# Patient Record
Sex: Male | Born: 1985 | Hispanic: No | Marital: Single | Smoking: Current every day smoker
Health system: Southern US, Community
[De-identification: ages and names within clinical notes are randomized; demographics above are authoritative.]

## PROBLEM LIST (undated history)

## (undated) DIAGNOSIS — K219 Gastro-esophageal reflux disease without esophagitis: Secondary | ICD-10-CM

## (undated) DIAGNOSIS — Z800 Family history of malignant neoplasm of digestive organs: Secondary | ICD-10-CM

## (undated) DIAGNOSIS — E785 Hyperlipidemia, unspecified: Secondary | ICD-10-CM

## (undated) DIAGNOSIS — F259 Schizoaffective disorder, unspecified: Secondary | ICD-10-CM

## (undated) DIAGNOSIS — M1990 Unspecified osteoarthritis, unspecified site: Secondary | ICD-10-CM

## (undated) DIAGNOSIS — Z809 Family history of malignant neoplasm, unspecified: Secondary | ICD-10-CM

## (undated) DIAGNOSIS — J449 Chronic obstructive pulmonary disease, unspecified: Secondary | ICD-10-CM

## (undated) DIAGNOSIS — F419 Anxiety disorder, unspecified: Secondary | ICD-10-CM

## (undated) DIAGNOSIS — I10 Essential (primary) hypertension: Secondary | ICD-10-CM

## (undated) HISTORY — DX: Essential (primary) hypertension: I10

## (undated) HISTORY — DX: Family history of malignant neoplasm, unspecified: Z80.9

## (undated) HISTORY — DX: Chronic obstructive pulmonary disease, unspecified: J44.9

## (undated) HISTORY — DX: Family history of malignant neoplasm of digestive organs: Z80.0

## (undated) HISTORY — DX: Gastro-esophageal reflux disease without esophagitis: K21.9

## (undated) HISTORY — DX: Unspecified osteoarthritis, unspecified site: M19.90

## (undated) HISTORY — DX: Anxiety disorder, unspecified: F41.9

## (undated) HISTORY — DX: Hyperlipidemia, unspecified: E78.5

---
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
## 2005-02-12 ENCOUNTER — Ambulatory Visit: Payer: Self-pay | Admitting: Internal Medicine

## 2010-06-14 ENCOUNTER — Emergency Department (HOSPITAL_COMMUNITY)
Admission: EM | Admit: 2010-06-14 | Discharge: 2010-06-15 | Payer: Self-pay | Source: Home / Self Care | Admitting: Emergency Medicine

## 2010-08-30 LAB — POCT I-STAT, CHEM 8
BUN: 10 mg/dL (ref 6–23)
Calcium, Ion: 1.06 mmol/L — ABNORMAL LOW (ref 1.12–1.32)
Chloride: 103 mEq/L (ref 96–112)
Creatinine, Ser: 1 mg/dL (ref 0.4–1.5)
Glucose, Bld: 112 mg/dL — ABNORMAL HIGH (ref 70–99)
HCT: 48 % (ref 39.0–52.0)
Hemoglobin: 16.3 g/dL (ref 13.0–17.0)
Potassium: 3.3 mEq/L — ABNORMAL LOW (ref 3.5–5.1)
Sodium: 140 mEq/L (ref 135–145)
TCO2: 25 mmol/L (ref 0–100)
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
## 2011-08-27 ENCOUNTER — Emergency Department (HOSPITAL_COMMUNITY)
Admission: EM | Admit: 2011-08-27 | Discharge: 2011-08-28 | Disposition: A | Discharge status: Home / Self Care | Payer: Self-pay | Attending: Emergency Medicine | Admitting: Emergency Medicine

## 2011-08-27 ENCOUNTER — Encounter (HOSPITAL_COMMUNITY): Payer: Self-pay | Admitting: Emergency Medicine

## 2011-08-27 DIAGNOSIS — K2970 Gastritis, unspecified, without bleeding: Secondary | ICD-10-CM | POA: Insufficient documentation

## 2011-08-27 DIAGNOSIS — Z79899 Other long term (current) drug therapy: Secondary | ICD-10-CM | POA: Insufficient documentation

## 2011-08-27 DIAGNOSIS — R112 Nausea with vomiting, unspecified: Secondary | ICD-10-CM | POA: Insufficient documentation

## 2011-08-27 DIAGNOSIS — R1013 Epigastric pain: Secondary | ICD-10-CM | POA: Insufficient documentation

## 2011-08-27 NOTE — ED Notes (Signed)
PT. REPORTS BLOODY EMESIS WITH UPPER ABDOMINALPAIN ONSET THIS EVENING , STATES HISTORY OF GASTRIC ULCER.

## 2011-08-28 ENCOUNTER — Encounter (HOSPITAL_COMMUNITY): Payer: Self-pay | Admitting: *Deleted

## 2011-08-28 LAB — DIFFERENTIAL
Basophils Absolute: 0.1 10*3/uL (ref 0.0–0.1)
Basophils Relative: 1 % (ref 0–1)
Eosinophils Absolute: 0.5 10*3/uL (ref 0.0–0.7)
Eosinophils Relative: 4 % (ref 0–5)
Lymphocytes Relative: 30 % (ref 12–46)
Lymphs Abs: 4.3 10*3/uL — ABNORMAL HIGH (ref 0.7–4.0)
Monocytes Absolute: 0.9 10*3/uL (ref 0.1–1.0)
Monocytes Relative: 6 % (ref 3–12)
Neutro Abs: 8.5 10*3/uL — ABNORMAL HIGH (ref 1.7–7.7)
Neutrophils Relative %: 59 % (ref 43–77)

## 2011-08-28 LAB — COMPREHENSIVE METABOLIC PANEL
Alkaline Phosphatase: 66 U/L (ref 39–117)
BUN: 8 mg/dL (ref 6–23)
Calcium: 9.1 mg/dL (ref 8.4–10.5)
GFR calc Af Amer: >90 mL/min (ref >90)
Glucose, Bld: 93 mg/dL (ref 70–99)
Total Protein: 6.9 g/dL (ref 6.0–8.3)

## 2011-08-28 LAB — BASIC METABOLIC PANEL
BUN: 8 mg/dL (ref 6–23)
Calcium: 9 mg/dL (ref 8.4–10.5)
Creatinine, Ser: 0.86 mg/dL (ref 0.50–1.35)
GFR calc non Af Amer: >90 mL/min (ref >90)
Glucose, Bld: 81 mg/dL (ref 70–99)

## 2011-08-28 LAB — CBC
HCT: 48 % (ref 39.0–52.0)
Hemoglobin: 16.9 g/dL (ref 13.0–17.0)
MCH: 33.6 pg (ref 26.0–34.0)
MCHC: 35.2 g/dL (ref 30.0–36.0)
MCV: 95.4 fL (ref 78.0–100.0)
Platelets: 282 10*3/uL (ref 150–400)
RBC: 5.03 MIL/uL (ref 4.22–5.81)
RDW: 13.8 % (ref 11.5–15.5)
WBC: 14.3 10*3/uL — ABNORMAL HIGH (ref 4.0–10.5)

## 2011-08-28 LAB — APTT: aPTT: 31 seconds (ref 24–37)

## 2011-08-28 LAB — PROTIME-INR: INR: 1.05 (ref 0.00–1.49)

## 2011-08-28 MED ORDER — FAMOTIDINE 20 MG PO TABS: 20.0000 mg | ORAL_TABLET | Freq: Two times a day (BID) | ORAL | Status: AC

## 2011-08-28 MED ORDER — GI COCKTAIL ~~LOC~~
30.0000 mL | Freq: Once | ORAL | Status: AC
  Administered 2011-08-28: 30 mL via ORAL
  Filled 2011-08-28: qty 30

## 2011-08-28 MED ORDER — FAMOTIDINE IN NACL 20-0.9 MG/50ML-% IV SOLN
20.0000 mg | INTRAVENOUS | Status: AC
  Administered 2011-08-28: 20 mg via INTRAVENOUS
  Filled 2011-08-28: qty 50

## 2011-08-28 MED ORDER — OXYCODONE-ACETAMINOPHEN 5-325 MG PO TABS
2.0000 | ORAL_TABLET | Freq: Once | ORAL | Status: AC
Start: 2011-08-28 — End: 2011-08-28
  Administered 2011-08-28: 2 via ORAL
  Filled 2011-08-28: qty 2

## 2011-08-28 MED ORDER — PANTOPRAZOLE SODIUM 40 MG IV SOLR
40.0000 mg | Freq: Once | INTRAVENOUS | Status: AC
  Administered 2011-08-28: 40 mg via INTRAVENOUS
  Filled 2011-08-28: qty 40

## 2011-08-28 MED ORDER — LANSOPRAZOLE 30 MG PO CPDR: 30.0000 mg | DELAYED_RELEASE_CAPSULE | Freq: Every day | ORAL | Status: AC

## 2011-08-28 NOTE — Discharge Instructions (Signed)
If you have a gastroenterologist, call them in the morning to set up a followup appointment for this week. If you do not have one see the attached phone number to call in the morning. Return to the emergency department for severe worsening pain, vomiting, persistent vomiting of blood. Take a medication called Pepcid and Prevacid as prescribed.

## 2011-08-28 NOTE — ED Notes (Signed)
Reports drinking beer & liquor Friday night, reports nvd and abd pain Saturday, last emesis ~ 1600, last BM  ~ 1200 runny/soft, last ate around 1700.

## 2011-08-28 NOTE — ED Provider Notes (Signed)
History     CSN: [id number]  Arrival date & time 08/27/11  2216   First MD Initiated Contact with Patient 08/28/11 0025      Chief Complaint  Patient presents with  . Emesis    (Consider location/radiation/quality/duration/timing/severity/associated sxs/prior treatment) HPI Comments: 6 rolled male with a history of a stomach ulcer approximately 5 years ago presents with acute onset of nausea and vomiting after eating spice pickled okra, this occurred within several minutes of eating a meal, states that it was multiple episodes of vomiting back-to-back for approximately 2-3 minutes. He noticed initially that it was just food contents which progressed into bright red blood and a small amount of dark red blood. This has essentially resolved but he has residual epigastric pain which is persistent, worse with palpation. He denies a history of fevers chills diarrhea blood in his stools swelling of his legs or rashes. He has no problems with easy bruising and has no history of coagulopathy. He has not been using his antacid medications for years he does take occasional NSAID medication for pain stenting of first and admits to drinking approximately 12 beers last night prior to going to bed. Because was having over this morning he took an aspirin and another beer can make it go away.    Patient is a 26 y.o. male presenting with vomiting. The history is provided by the patient.  Emesis     Past Medical History  Diagnosis Date  . Gastric ulcer     History reviewed. No pertinent past surgical history.  History reviewed. No pertinent family history.  History  Substance Use Topics  . Smoking status: Current Everyday Smoker  . Smokeless tobacco: Not on file  . Alcohol Use: Yes      Review of Systems  Gastrointestinal: Positive for vomiting.  All other systems reviewed and are negative.    Allergies  Review of patient's allergies indicates no known allergies.  Home Medications    Current Outpatient Rx  Name Route Sig Dispense Refill  . ACETAMINOPHEN 500 MG PO TABS Oral Take 500 mg by mouth once.    Marland Kitchen FAMOTIDINE 20 MG PO TABS Oral Take 1 tablet (20 mg total) by mouth 2 (two) times daily. 30 tablet 0  . LANSOPRAZOLE 30 MG PO CPDR Oral Take 1 capsule (30 mg total) by mouth daily. 30 capsule 1    BP 112/92  Pulse 90  Temp(Src) 98.6 F (37 C) (Oral)  Resp 20  SpO2 98%  Physical Exam  Nursing note and vitals reviewed. Constitutional: He appears well-developed and well-nourished. No distress.  HENT:  Head: Normocephalic and atraumatic.  Mouth/Throat: Oropharynx is clear and moist. No oropharyngeal exudate.  Eyes: Conjunctivae and EOM are normal. Pupils are equal, round, and reactive to light. Right eye exhibits no discharge. Left eye exhibits no discharge. No scleral icterus.  Neck: Normal range of motion. Neck supple. No JVD present. No thyromegaly present.  Cardiovascular: Normal rate, regular rhythm, normal heart sounds and intact distal pulses.  Exam reveals no gallop and no friction rub.   No murmur heard. Pulmonary/Chest: Effort normal and breath sounds normal. No respiratory distress. He has no wheezes. He has no rales.  Abdominal: Soft. Bowel sounds are normal. He exhibits no distension and no mass. There is tenderness.       ttp in the epigastrium, RUQ and LuQ, non peritoneal, no ttp in the RLQ  Musculoskeletal: Normal range of motion. He exhibits no edema and no tenderness.  Lymphadenopathy:  He has no cervical adenopathy.  Neurological: He is alert. Coordination normal.  Skin: Skin is warm and dry. No rash noted. No erythema.  Psychiatric: He has a normal mood and affect. His behavior is normal.    ED Course  Procedures (including critical care time)  Labs Reviewed  CBC - Abnormal; Notable for the following:    WBC 14.3 (*)    All other components within normal limits  DIFFERENTIAL - Abnormal; Notable for the following:    Neutro Abs 8.5  (*)    Lymphs Abs 4.3 (*)    All other components within normal limits  COMPREHENSIVE METABOLIC PANEL - Abnormal; Notable for the following:    Total Bilirubin 0.2 (*)    All other components within normal limits  BASIC METABOLIC PANEL  LIPASE, BLOOD  APTT  PROTIME-INR  CBC   No results found.   1. Gastritis       MDM  R/o peptic ulcer - pancreatitis, RUQ pain / chole.  Labs reviewed and are unremarkable including lipase, comprehensive metabolic panel, coagulopathy panel. CBC shows elevated white blood cells as an isolated abnormality. He has had no more vomiting, no more hematemesis and improved pain with a GI cocktail Pepcid and Protonix intravenously. I have referred the patient to gastroenterology as an outpatient,  Discharge Prescriptions include:  #1 Pepcid  #2 Prevacid   Vida Roller, MD 08/28/11 [phone]

## 2011-08-28 NOTE — ED Notes (Signed)
Registration out of room, EDP into room, pt alert, NAD, calm, interactive, sitting upright on stretcher.

## 2011-08-28 NOTE — ED Notes (Signed)
"  When I had anything to eat , I stated vomiting and the color turned to dark blood  To bright red blood" stated have hx of ulcer.

## 2013-04-05 ENCOUNTER — Emergency Department: Payer: Self-pay | Admitting: Emergency Medicine

## 2013-04-13 ENCOUNTER — Emergency Department: Payer: Self-pay | Admitting: Emergency Medicine

## 2013-04-13 LAB — DRUG SCREEN, URINE
Barbiturates, Ur Screen: NEGATIVE (ref ?–200)
Cannabinoid 50 Ng, Ur ~~LOC~~: NEGATIVE (ref ?–50)
Cocaine Metabolite,Ur ~~LOC~~: POSITIVE (ref ?–300)
MDMA (Ecstasy)Ur Screen: NEGATIVE (ref ?–500)
Methadone, Ur Screen: NEGATIVE (ref ?–300)
Phencyclidine (PCP) Ur S: NEGATIVE (ref ?–25)

## 2013-04-13 LAB — URINALYSIS, COMPLETE
Bacteria: NONE SEEN
Bilirubin,UR: NEGATIVE
Blood: NEGATIVE
Glucose,UR: NEGATIVE mg/dL (ref 0–75)
Leukocyte Esterase: NEGATIVE
RBC,UR: 1 /HPF (ref 0–5)
WBC UR: <1 /HPF (ref 0–5)

## 2013-04-13 LAB — COMPREHENSIVE METABOLIC PANEL
Albumin: 4.2 g/dL (ref 3.4–5.0)
Anion Gap: 8 (ref 7–16)
Chloride: 101 mmol/L (ref 98–107)
Co2: 26 mmol/L (ref 21–32)
Creatinine: 1.19 mg/dL (ref 0.60–1.30)
Glucose: 93 mg/dL (ref 65–99)
Osmolality: 269 (ref 275–301)
SGOT(AST): 29 U/L (ref 15–37)
Sodium: 135 mmol/L — ABNORMAL LOW (ref 136–145)
Total Protein: 7.4 g/dL (ref 6.4–8.2)

## 2013-04-13 LAB — CBC
HCT: 51.1 % (ref 40.0–52.0)
HGB: 17.8 g/dL (ref 13.0–18.0)
MCHC: 34.9 g/dL (ref 32.0–36.0)
RBC: 5.33 10*6/uL (ref 4.40–5.90)
WBC: 13.8 10*3/uL — ABNORMAL HIGH (ref 3.8–10.6)

## 2013-04-13 LAB — ETHANOL: Ethanol: <3 mg/dL

## 2016-12-22 ENCOUNTER — Emergency Department
Admission: EM | Admit: 2016-12-22 | Discharge: 2016-12-23 | Disposition: A | Discharge status: Home / Self Care | Payer: Self-pay | Attending: Emergency Medicine | Admitting: Emergency Medicine

## 2016-12-22 DIAGNOSIS — F17200 Nicotine dependence, unspecified, uncomplicated: Secondary | ICD-10-CM | POA: Insufficient documentation

## 2016-12-22 DIAGNOSIS — F259 Schizoaffective disorder, unspecified: Secondary | ICD-10-CM | POA: Insufficient documentation

## 2016-12-22 HISTORY — DX: Schizoaffective disorder, unspecified: F25.9

## 2016-12-22 LAB — COMPREHENSIVE METABOLIC PANEL
ALBUMIN: 4.5 g/dL (ref 3.5–5.0)
ALT: 28 U/L (ref 17–63)
AST: 30 U/L (ref 15–41)
Alkaline Phosphatase: 62 U/L (ref 38–126)
Anion gap: 8 (ref 5–15)
BUN: 8 mg/dL (ref 6–20)
CHLORIDE: 101 mmol/L (ref 101–111)
CO2: 28 mmol/L (ref 22–32)
CREATININE: 1.05 mg/dL (ref 0.61–1.24)
Calcium: 9.5 mg/dL (ref 8.9–10.3)
GFR calc Af Amer: >60 mL/min (ref >60)
GLUCOSE: 103 mg/dL — AB (ref 65–99)
Potassium: 4 mmol/L (ref 3.5–5.1)
Sodium: 137 mmol/L (ref 135–145)
Total Bilirubin: 0.5 mg/dL (ref 0.3–1.2)
Total Protein: 7.5 g/dL (ref 6.5–8.1)

## 2016-12-22 LAB — URINE DRUG SCREEN, QUALITATIVE (ARMC ONLY)
AMPHETAMINES, UR SCREEN: NOT DETECTED
Barbiturates, Ur Screen: NOT DETECTED
Benzodiazepine, Ur Scrn: NOT DETECTED
CANNABINOID 50 NG, UR ~~LOC~~: NOT DETECTED
COCAINE METABOLITE, UR ~~LOC~~: NOT DETECTED
MDMA (ECSTASY) UR SCREEN: NOT DETECTED
Methadone Scn, Ur: NOT DETECTED
OPIATE, UR SCREEN: NOT DETECTED
PHENCYCLIDINE (PCP) UR S: NOT DETECTED
Tricyclic, Ur Screen: NOT DETECTED

## 2016-12-22 LAB — CBC
HEMATOCRIT: 48.3 % (ref 40.0–52.0)
HEMOGLOBIN: 17 g/dL (ref 13.0–18.0)
MCH: 33.2 pg (ref 26.0–34.0)
MCHC: 35.3 g/dL (ref 32.0–36.0)
MCV: 94 fL (ref 80.0–100.0)
Platelets: 303 10*3/uL (ref 150–440)
RBC: 5.14 MIL/uL (ref 4.40–5.90)
RDW: 13.6 % (ref 11.5–14.5)
WBC: 13.4 10*3/uL — AB (ref 3.8–10.6)

## 2016-12-22 LAB — ACETAMINOPHEN LEVEL: Acetaminophen (Tylenol), Serum: <10 ug/mL — ABNORMAL LOW (ref 10–30)

## 2016-12-22 LAB — SALICYLATE LEVEL: Salicylate Lvl: <7 mg/dL (ref 2.8–30.0)

## 2016-12-22 LAB — ETHANOL

## 2016-12-22 NOTE — ED Triage Notes (Signed)
Pt states that he is seeing a [facility] [place] and that he was changed from JordanLatuda to risperidone 4mg  QHS and Vistaril 50mg  BID PRN anxiety.  Pt states that he was trying to transition from latuda to invega injection.  Pt states that he started the risperidone for over a month at this point.  Pt denies SI/HI.  Pt states that he feels like he is pacing and having increased anxiety and that this medication is not working as well as the JordanLatuda was working for him.  Pt states that he would like to be back on Latuda 120mg .  Pt is cooperative in triage.  Wynonia SoursMike, Tech to dress patient out.

## 2016-12-22 NOTE — ED Notes (Signed)
Pt to ED 20 c/o increased anxiety and "racing thoughts" today; pt states he was put on latuda and that was working well for him, then over a month ago, he was put on risperdal and feels that it is not working as well for him; pt states having decreased ability to concentrate and increased "not normal" thoughts; when pressed as to what he meant by "not normal" pt states that its not something he likes to talk about, but they are religious thoughts. Pt is awake, alert and oriented x4; pt is calm and cooperative and answers questions appropriately.

## 2016-12-23 MED ORDER — LURASIDONE HCL 80 MG PO TABS
120.0000 mg | ORAL_TABLET | Freq: Every day | ORAL | Status: DC
  Administered 2016-12-23: 120 mg via ORAL
  Filled 2016-12-23: qty 2

## 2016-12-23 MED ORDER — DIPHENHYDRAMINE HCL 25 MG PO CAPS
50.0000 mg | ORAL_CAPSULE | Freq: Once | ORAL | Status: AC
  Administered 2016-12-23: 50 mg via ORAL
  Filled 2016-12-23: qty 2

## 2016-12-23 NOTE — ED Notes (Signed)
Patient is alert and oriented, calm and cooperative and displays appropriate behavior. Nutrition and fluids offered. Toileting and hygiene offered. Sitter is present and rounding every 15 minutes. Security officer is present.   

## 2016-12-23 NOTE — ED Notes (Signed)

## 2016-12-23 NOTE — ED Provider Notes (Signed)
[facility] Emergency Department Provider Note    First MD Initiated Contact with Patient 12/23/16 0115     (approximate)  I have reviewed the triage vital signs and the nursing notes.   HISTORY  Chief Complaint Psychiatric Evaluation   HPI David Phillips is a 31 y.o. male with below list of chronic medical conditions including schizoaffective disorder presents to the emergency department with request for dose of Latuda. Patient states that he's been taking Latuda for approximately 6 years however he wanted to switch to a different form of antipsychotic and a such she's been taking risperidone for approximately one month. He believes that this is not working as he is feeling really anxious and racing thoughts. Patient denies any SI no HI. Patient states "I want to be switched back to JordanLatuda" .Patient denies any suicidal or homicidal ideations  Past Medical History:  Diagnosis Date  . Gastric ulcer   . Schizoaffective disorder (HCC)     There are no active problems to display for this patient.   History reviewed. No pertinent surgical history.  Prior to Admission medications   Medication Sig Start Date End Date Taking? Authorizing Provider  acetaminophen (TYLENOL) 500 MG tablet Take 500 mg by mouth once.    [provider]    Allergies Prednisone  No family history on file.  Social History Social History  Substance Use Topics  . Smoking status: Current Every Day Smoker  . Smokeless tobacco: Not on file  . Alcohol use Yes    Review of Systems Constitutional: No fever/chills Eyes: No visual changes. ENT: No sore throat. Cardiovascular: Denies chest pain. Respiratory: Denies shortness of breath. Gastrointestinal: No abdominal pain.  No nausea, no vomiting.  No diarrhea.  No constipation. Genitourinary: Negative for dysuria. Musculoskeletal: Negative for neck pain.  Negative for back pain. Integumentary: Negative for  rash. Neurological: Negative for headaches, focal weakness or numbness. Psychiatric:Admits to anxiety and racing thoughts   ____________________________________________   PHYSICAL EXAM:  VITAL SIGNS: ED Triage Vitals  Enc Vitals Group     BP 12/22/16 2136 (!) 176/98     Pulse Rate 12/22/16 2136 86     Resp 12/22/16 2136 (!) 22     Temp 12/22/16 2136 97.9 F (36.6 C)     Temp Source 12/22/16 2136 Oral     SpO2 12/22/16 2136 99 %     Weight 12/22/16 2137 79.4 kg (175 lb)     Height 12/22/16 2137 1.753 m (5\' 9" )     Head Circumference --      Peak Flow --      Pain Score --      Pain Loc --      Pain Edu? --      Excl. in GC? --     Constitutional: Alert and oriented. Well appearing and in no acute distress. Eyes: Conjunctivae are normal. PERRL. EOMI. Head: Atraumatic. Mouth/Throat: Mucous membranes are moist.  Oropharynx non-erythematous. Neck: No stridor.   Cardiovascular: Normal rate, regular rhythm. Good peripheral circulation. Grossly normal heart sounds. Respiratory: Normal respiratory effort.  No retractions. Lungs CTAB. Gastrointestinal: Soft and nontender. No distention.  Musculoskeletal: No lower extremity tenderness nor edema. No gross deformities of extremities. Neurologic:  Normal speech and language. No gross focal neurologic deficits are appreciated.  Skin:  Skin is warm, dry and intact. No rash noted. Psychiatric: Mood and affect are normal. Speech and behavior are normal.  ____________________________________________   LABS (all labs ordered are listed,  but only abnormal results are displayed)  Labs Reviewed  COMPREHENSIVE METABOLIC PANEL - Abnormal; Notable for the following:       Result Value   Glucose, Bld 103 (*)    All other components within normal limits  ACETAMINOPHEN LEVEL - Abnormal; Notable for the following:    Acetaminophen (Tylenol), Serum <10 (*)    All other components within normal limits  CBC - Abnormal; Notable for the  following:    WBC 13.4 (*)    All other components within normal limits  ETHANOL  SALICYLATE LEVEL  URINE DRUG SCREEN, QUALITATIVE ([facility] ONLY)    Procedures   ____________________________________________   INITIAL IMPRESSION / ASSESSMENT AND PLAN / ED COURSE  Pertinent labs & imaging results that were available during my care of the patient were reviewed by me and considered in my medical decision making (see chart for details).  31 year old male presenting but no suicidal or homicidal ideation or any acute psychosis. Patient requesting a dose of Latuda 120 mg she states that he's been taking for approximately 6 years and recently switched to risperidone 1 month ago at his request. Patient is requesting to be switched back to [name]. Patient will be given a dose here in the emergency department and is advised to follow up with his psychiatrist today which she is agreeable to.      ____________________________________________  FINAL CLINICAL IMPRESSION(S) / ED DIAGNOSES  Final diagnoses:  Schizoaffective disorder, unspecified type (HCC)     MEDICATIONS GIVEN DURING THIS VISIT:  Medications  lurasidone (LATUDA) tablet 120 mg (120 mg Oral Given 12/23/16 0151)  diphenhydrAMINE (BENADRYL) capsule 50 mg (50 mg Oral Given 12/23/16 0151)     NEW OUTPATIENT MEDICATIONS STARTED DURING THIS VISIT:  New Prescriptions   No medications on file    Modified Medications   No medications on file    Discontinued Medications   No medications on file     Note:  This document was prepared using Dragon voice recognition software and may include unintentional dictation errors.    Darci Current, MD 12/23/16 0157

## 2017-02-23 ENCOUNTER — Ambulatory Visit: Payer: Self-pay | Admitting: Adult Health Nurse Practitioner

## 2017-02-23 VITALS — BP 145/84 | HR 87 | Ht 68.5 in | Wt 183.2 lb

## 2017-02-23 DIAGNOSIS — Z0000 Encounter for general adult medical examination without abnormal findings: Secondary | ICD-10-CM

## 2017-02-23 DIAGNOSIS — I10 Essential (primary) hypertension: Secondary | ICD-10-CM | POA: Insufficient documentation

## 2017-02-23 MED ORDER — LISINOPRIL 10 MG PO TABS: 10.0000 mg | ORAL_TABLET | Freq: Every day | ORAL | 4 refills | Status: DC

## 2017-02-23 NOTE — Progress Notes (Signed)
   Subjective:    Patient ID: David Phillips, male    DOB: 02/26/1986, 31 y.o.   MRN: [id number]  HPI  [facility]. Pt is a smoker - pt was smoking before visit. Pt reports family history of hypertension.   Pt reports previous athletes foot that has since healed - began in toes with bleeding and sores. Pt reports rash on his fingers that he believes is related.  Pt reports prev high WBC and was tested for disease. No findings.   There are no active problems to display for this patient.  Allergies as of 02/23/2017      Reactions   Prednisone       Medication List       Accurate as of 02/23/17  6:30 PM. Always use your most recent med list.          hydrOXYzine 50 MG capsule Commonly known as:  VISTARIL Take 50 mg by mouth 3 (three) times daily as needed.   LATUDA 120 MG Tabs Generic drug:  Lurasidone HCl Take 1 tablet by mouth daily.        Review of Systems  BP elevated today. BP in ED in July was 176/98. Pt psych dx and Latuda managed at [facility]. Pt reports no intention to harm.     Objective:   Physical Exam  Constitutional: He is oriented to person, place, and time. He appears well-developed and well-nourished.  Cardiovascular: Normal rate, regular rhythm and normal heart sounds.   Pulmonary/Chest: Effort normal and breath sounds normal.  Abdominal: Soft. Bowel sounds are normal.  Neurological: He is alert and oriented to person, place, and time.  Skin: Skin is warm and dry.    BP (!) 145/84 (BP Location: Left Arm, Patient Position: Sitting, Cuff Size: Normal)   Pulse 87   Ht 5' 8.5" (1.74 m)   Wt 183 lb 3.2 oz (83.1 kg)   BMI 27.45 kg/m        Assessment & Plan:   Newly diagnosed hypertension.  Order Lisinopril 10mg  daily for hypertension. Low sodium diet.   Labs today: CBC, CMET, A1c, Lipid, TSH Referral for dental exam.  F/u in 4 weeks for lab review and BP check

## 2017-02-24 LAB — COMPREHENSIVE METABOLIC PANEL
ALBUMIN: 4.5 g/dL (ref 3.5–5.5)
ALK PHOS: 83 IU/L (ref 39–117)
ALT: 22 IU/L (ref 0–44)
AST: 18 IU/L (ref 0–40)
Albumin/Globulin Ratio: 1.7 (ref 1.2–2.2)
BUN / CREAT RATIO: 7 — AB (ref 9–20)
BUN: 8 mg/dL (ref 6–20)
Bilirubin Total: <0.2 mg/dL (ref 0.0–1.2)
CO2: 26 mmol/L (ref 20–29)
CREATININE: 1.11 mg/dL (ref 0.76–1.27)
Calcium: 9.6 mg/dL (ref 8.7–10.2)
Chloride: 98 mmol/L (ref 96–106)
GFR, EST AFRICAN AMERICAN: 102 mL/min/{1.73_m2} (ref >59)
GFR, EST NON AFRICAN AMERICAN: 88 mL/min/{1.73_m2} (ref >59)
GLOBULIN, TOTAL: 2.6 g/dL (ref 1.5–4.5)
GLUCOSE: 116 mg/dL — AB (ref 65–99)
Potassium: 5 mmol/L (ref 3.5–5.2)
SODIUM: 141 mmol/L (ref 134–144)
TOTAL PROTEIN: 7.1 g/dL (ref 6.0–8.5)

## 2017-02-24 LAB — LIPID PANEL
Chol/HDL Ratio: 5.3 ratio — ABNORMAL HIGH (ref 0.0–5.0)
Cholesterol, Total: 191 mg/dL (ref 100–199)
HDL: 36 mg/dL — AB (ref >39)
LDL Calculated: 116 mg/dL — ABNORMAL HIGH (ref 0–99)
Triglycerides: 195 mg/dL — ABNORMAL HIGH (ref 0–149)
VLDL Cholesterol Cal: 39 mg/dL (ref 5–40)

## 2017-02-24 LAB — CBC
HEMATOCRIT: 48.8 % (ref 37.5–51.0)
Hemoglobin: 16.7 g/dL (ref 13.0–17.7)
MCH: 33.3 pg — AB (ref 26.6–33.0)
MCHC: 34.2 g/dL (ref 31.5–35.7)
MCV: 97 fL (ref 79–97)
PLATELETS: 316 10*3/uL (ref 150–379)
RBC: 5.02 x10E6/uL (ref 4.14–5.80)
RDW: 13.4 % (ref 12.3–15.4)
WBC: 11.6 10*3/uL — ABNORMAL HIGH (ref 3.4–10.8)

## 2017-02-24 LAB — HEMOGLOBIN A1C
ESTIMATED AVERAGE GLUCOSE: 108 mg/dL
HEMOGLOBIN A1C: 5.4 % (ref 4.8–5.6)

## 2017-02-24 LAB — TSH: TSH: 1.47 u[IU]/mL (ref 0.450–4.500)

## 2017-03-23 ENCOUNTER — Ambulatory Visit: Payer: Self-pay | Admitting: Adult Health Nurse Practitioner

## 2017-03-23 VITALS — BP 137/80 | HR 90 | Temp 98.0°F | Wt 188.6 lb

## 2017-03-23 DIAGNOSIS — I10 Essential (primary) hypertension: Secondary | ICD-10-CM

## 2017-03-23 DIAGNOSIS — J309 Allergic rhinitis, unspecified: Secondary | ICD-10-CM

## 2017-03-23 NOTE — Progress Notes (Signed)
  Patient: David Phillips Male    DOB: 02/07/1986   31 y.o.   MRN: [id number] Visit Date: 03/23/2017  Today's Provider: Jacelyn Pi, NP   Chief Complaint  Patient presents with  . Follow-up    review blood work. needs a doctors note. recent URI   Subjective:    HPI  Here for BP check and lab review.    Pt states that he had sneezing, coughing that started on Friday, states the drainage and coughing are not as bad today. His symptoms are improving.  States that he needs a note to return to work.   CBC has improved from 13.4 to 11.6.    Allergies  Allergen Reactions  . Prednisone    Previous Medications   HYDROXYZINE (VISTARIL) 50 MG CAPSULE    Take 50 mg by mouth 3 (three) times daily as needed.   LISINOPRIL (PRINIVIL,ZESTRIL) 10 MG TABLET    Take 1 tablet (10 mg total) by mouth daily.   LURASIDONE HCL (LATUDA) 120 MG TABS    Take 1 tablet by mouth daily.    Review of Systems  All other systems reviewed and are negative.   Social History  Substance Use Topics  . Smoking status: Current Every Day Smoker    Packs/day: 1.00    Years: 20.00    Types: Cigarettes    Start date: 15  . Smokeless tobacco: Former Neurosurgeon  . Alcohol use 3.6 oz/week    6 Cans of beer per week   Objective:   BP 137/80 (BP Location: Left Arm)   Pulse 90   Temp 98 F (36.7 C)   Wt 188 lb 9.6 oz (85.5 kg)   BMI 28.26 kg/m   Physical Exam  Constitutional: He appears well-developed and well-nourished.  HENT:  Head: Normocephalic and atraumatic.  Eyes: Pupils are equal, round, and reactive to light. Right eye exhibits no discharge.  Neck: Normal range of motion. Neck supple.  Cardiovascular: Normal rate, regular rhythm and normal heart sounds.   Pulmonary/Chest: Effort normal and breath sounds normal.  Abdominal: Soft. Bowel sounds are normal.  Neurological: He is alert.  Skin: Skin is warm and dry.        Assessment & Plan:         HTN:  Controlled.  Goal BP <140/90.   Continue current medication regimen.  Encourage low salt diet and exercise.   WBC count improved.   Will give Zyrtec samples for symptom management.     Jacelyn Pi, NP   [facility]

## 2017-09-21 ENCOUNTER — Ambulatory Visit: Payer: Self-pay

## 2017-11-03 ENCOUNTER — Encounter (HOSPITAL_COMMUNITY): Payer: Self-pay

## 2017-11-03 ENCOUNTER — Ambulatory Visit (HOSPITAL_COMMUNITY)
Admission: EM | Admit: 2017-11-03 | Discharge: 2017-11-03 | Disposition: A | Discharge status: Home / Self Care | Payer: Medicaid Other | Attending: Family Medicine | Admitting: Family Medicine

## 2017-11-03 DIAGNOSIS — S81002A Unspecified open wound, left knee, initial encounter: Secondary | ICD-10-CM

## 2017-11-03 DIAGNOSIS — S91002A Unspecified open wound, left ankle, initial encounter: Secondary | ICD-10-CM | POA: Diagnosis not present

## 2017-11-03 DIAGNOSIS — S81802A Unspecified open wound, left lower leg, initial encounter: Secondary | ICD-10-CM

## 2017-11-03 MED ORDER — DOXYCYCLINE HYCLATE 100 MG PO CAPS: 100.0000 mg | ORAL_CAPSULE | Freq: Two times a day (BID) | ORAL | 0 refills | Status: DC

## 2017-11-03 MED ORDER — HYDROXYZINE PAMOATE 50 MG PO CAPS: 50.0000 mg | ORAL_CAPSULE | Freq: Three times a day (TID) | ORAL | 0 refills | Status: DC | PRN

## 2017-11-03 MED ORDER — MUPIROCIN CALCIUM 2 % EX CREA: 1.0000 "application " | TOPICAL_CREAM | Freq: Two times a day (BID) | CUTANEOUS | 0 refills | Status: DC

## 2017-11-03 NOTE — ED Provider Notes (Signed)
[facility]    CSN: [id number] Arrival date & time: 11/03/17  1745     History   Chief Complaint No chief complaint on file.   HPI David Phillips is a 32 y.o. male.   HPI  Patient is here for a "tick bite".  He states that he noticed an itchy irritated area on his upper left thigh/buttock region and looked with a mirror and thought he saw a tick.  He states that must have come off when he scratched it.  He never actually saw the insect.  In any event, ever since that time he has had an itchy irritated red area that he thinks is infected He also has the believes that because he had athlete's foot that he has a fungus in his bloodstream that got in through the blisters.  I told him that this is impossible He does have a schizoaffective disorder and is under the care of a local mental health center.  Past Medical History:  Diagnosis Date  . Gastric ulcer   . Schizoaffective disorder [facility])     Patient Active Problem List   Diagnosis Date Noted  . Allergic rhinitis 03/23/2017  . Hypertension 02/23/2017    History reviewed. No pertinent surgical history.     Home Medications    Prior to Admission medications   Medication Sig Start Date End Date Taking? Authorizing Provider  lisinopril (PRINIVIL,ZESTRIL) 10 MG tablet Take 1 tablet (10 mg total) by mouth daily. 02/23/17  Yes Doles-Johnson, Teah, NP  Lurasidone HCl (LATUDA) 120 MG TABS Take 1 tablet by mouth daily.   Yes [provider]  doxycycline (VIBRAMYCIN) 100 MG capsule Take 1 capsule (100 mg total) by mouth 2 (two) times daily. 11/03/17   Eustace Moore, MD  hydrOXYzine (VISTARIL) 50 MG capsule Take 1 capsule (50 mg total) by mouth 3 (three) times daily as needed. For itch or anxiety 11/03/17   Eustace Moore, MD  mupirocin cream (BACTROBAN) 2 % Apply 1 application topically 2 (two) times daily. 11/03/17   Eustace Moore, MD    Family History Family History  Problem Relation Age of Onset    . Diabetes Mother   . Diabetes Father   . Heart attack Father   . Schizophrenia Other   . Other Neg Hx     Social History Social History   Tobacco Use  . Smoking status: Current Every Day Smoker    Packs/day: 1.00    Years: 20.00    Pack years: 20.00    Types: Cigarettes    Start date: 41  . Smokeless tobacco: Former Engineer, water Use Topics  . Alcohol use: Yes    Alcohol/week: 3.6 oz    Types: 6 Cans of beer per week  . Drug use: No     Allergies   Prednisone   Review of Systems Review of Systems  Constitutional: Negative for chills and fever.  HENT: Negative for congestion and dental problem.   Eyes: Negative for pain and visual disturbance.  Respiratory: Negative for cough and shortness of breath.   Cardiovascular: Negative for chest pain and palpitations.  Gastrointestinal: Negative for abdominal pain, constipation and diarrhea.  Genitourinary: Negative for dysuria and hematuria.  Musculoskeletal: Negative for arthralgias and back pain.  Skin: Positive for wound. Negative for color change and rash.  Neurological: Negative for dizziness and headaches.  All other systems reviewed and are negative.    Physical Exam Triage Vital Signs ED Triage Vitals  Enc Vitals Group     BP 11/03/17 1812 (!) 153/99     Pulse Rate 11/03/17 1812 89     Resp 11/03/17 1812 16     Temp 11/03/17 1812 99.7 F (37.6 C)     Temp Source 11/03/17 1812 Oral     SpO2 11/03/17 1812 98 %     Weight --      Height --      Head Circumference --      Peak Flow --      Pain Score 11/03/17 1820 5     Pain Loc --      Pain Edu? --      Excl. in GC? --    No data found.  Updated Vital Signs BP (!) 153/99 (BP Location: Left Arm)   Pulse 89   Temp 99.7 F (37.6 C) (Oral)   Resp 16   SpO2 98%   Visual Acuity Right Eye Distance:   Left Eye Distance:   Bilateral Distance:    Right Eye Near:   Left Eye Near:    Bilateral Near:     Physical Exam  Constitutional: He  appears well-developed and well-nourished.  HENT:  Head: Normocephalic and atraumatic.  Eyes: Conjunctivae are normal.  Neck: Neck supple.  Cardiovascular: Normal rate.  No murmur heard. Abdominal: Soft. He exhibits no distension.  Musculoskeletal: He exhibits no edema.  Neurological: He is alert.  Skin: Skin is warm and dry.  On the posterior upper left thigh just at the crease of the buttocks there is a shallow 1 cm ulcer with yellow eschar, surrounding 2 cm of erythema that is mildly tender.  No purulence.  No evidence of insect bite.  Possible abscess that is drained.  Psychiatric: He has a normal mood and affect.  Nursing note and vitals reviewed.    UC Treatments / Results  Labs (all labs ordered are listed, but only abnormal results are displayed) Labs Reviewed - No data to display  EKG None  Radiology No results found.  Procedures Procedures (including critical care time)  Medications Ordered in UC Medications - No data to display  Initial Impression / Assessment and Plan / UC Course  I have reviewed the triage vital signs and the nursing notes.  Pertinent labs & imaging results that were available during my care of the patient were reviewed by me and considered in my medical decision making (see chart for details).     Discussed that I do not see any evidence of a tic.  I am going to give him doxycycline for his skin infection.  I am going to give him Bactroban to put on the wound and of told him to keep his hands away from it because scratching it is what is making it deeper and irritated.  He has hydroxyzine for anxiety and I told him this would also help with any itching.  We discussed that there is no need to do blood work to check for fungus. Final Clinical Impressions(s) / UC Diagnoses   Final diagnoses:  Open wound of left knee, leg, and ankle with complication, initial encounter     Discharge Instructions     Apply cream to rash 2 x a day Do not  pick at the opening Take the antibiotic as directed twice a day You can use hydroxyzine both for itching or anxiety Expect improvement over the next week or 2    ED Prescriptions    Medication Sig Dispense Auth.  Provider   hydrOXYzine (VISTARIL) 50 MG capsule Take 1 capsule (50 mg total) by mouth 3 (three) times daily as needed. For itch or anxiety 30 capsule Eustace Moore, MD   doxycycline (VIBRAMYCIN) 100 MG capsule Take 1 capsule (100 mg total) by mouth 2 (two) times daily. 14 capsule Eustace Moore, MD   mupirocin cream (BACTROBAN) 2 % Apply 1 application topically 2 (two) times daily. 15 g Eustace Moore, MD     Controlled Substance Prescriptions [place] Controlled Substance Registry consulted? Not Applicable   Eustace Moore, MD 11/03/17 2127

## 2017-11-03 NOTE — ED Triage Notes (Addendum)
Pt presents with complaints of a bump where he had a tick on his scrotum. Pt also is requesting pain medication for his wisdom tooth. Also states concern for athletes foot getting into his blood stream because he has bumps on his fingers.

## 2017-11-03 NOTE — Discharge Instructions (Addendum)
Apply cream to rash 2 x a day Do not pick at the opening Take the antibiotic as directed twice a day You can use hydroxyzine both for itching or anxiety Expect improvement over the next week or 2

## 2017-11-17 ENCOUNTER — Encounter (HOSPITAL_COMMUNITY): Payer: Self-pay

## 2017-11-17 ENCOUNTER — Ambulatory Visit (HOSPITAL_COMMUNITY)
Admission: EM | Admit: 2017-11-17 | Discharge: 2017-11-17 | Disposition: A | Discharge status: Home / Self Care | Payer: Medicaid Other | Attending: Emergency Medicine | Admitting: Emergency Medicine

## 2017-11-17 DIAGNOSIS — S30863A Insect bite (nonvenomous) of scrotum and testes, initial encounter: Secondary | ICD-10-CM | POA: Diagnosis not present

## 2017-11-17 DIAGNOSIS — W57XXXD Bitten or stung by nonvenomous insect and other nonvenomous arthropods, subsequent encounter: Secondary | ICD-10-CM

## 2017-11-17 DIAGNOSIS — M542 Cervicalgia: Secondary | ICD-10-CM

## 2017-11-17 DIAGNOSIS — M5489 Other dorsalgia: Secondary | ICD-10-CM

## 2017-11-17 MED ORDER — DOXYCYCLINE HYCLATE 100 MG PO CAPS: 100.0000 mg | ORAL_CAPSULE | Freq: Two times a day (BID) | ORAL | 0 refills | Status: AC

## 2017-11-17 MED ORDER — CYCLOBENZAPRINE HCL 10 MG PO TABS: 10.0000 mg | ORAL_TABLET | Freq: Two times a day (BID) | ORAL | 0 refills | Status: DC | PRN

## 2017-11-17 MED ORDER — HYDROXYZINE PAMOATE 50 MG PO CAPS: 50.0000 mg | ORAL_CAPSULE | Freq: Three times a day (TID) | ORAL | 0 refills | Status: DC | PRN

## 2017-11-17 NOTE — Discharge Instructions (Addendum)
Please begin doxycycline and hydroxyzine  Redness should continue to improve

## 2017-11-17 NOTE — ED Triage Notes (Signed)
Pt presents with complaints of following up on a tick bite on his scrotum states he thinks it is infected.

## 2017-11-18 NOTE — ED Provider Notes (Signed)
[facility]    CSN: [id number] Arrival date & time: 11/17/17  1040     History   Chief Complaint Chief Complaint  Patient presents with  . Insect Bite    HPI David Phillips is a 32 y.o. male presenting today for evaluation of a tick bite as well as some neck and back pain.  Patient states that he was bit by a tick approximately 2 to 3 weeks ago, seen here quickly afterwards.  Put on a course of doxycycline for 7 days.  Finished this a week ago.  Feels area has improved slightly, but concerned as redness and bite are still present.  Denies any headaches, arthralgias.  Denies rash spreading.  Also endorsing back and neck pain which she has had for a while, related to work.  Requesting something for this.  Taken Tylenol without relief.  Denies numbness or tingling.  Denies any injury.  HPI  Past Medical History:  Diagnosis Date  . Gastric ulcer   . Schizoaffective disorder [facility])     Patient Active Problem List   Diagnosis Date Noted  . Allergic rhinitis 03/23/2017  . Hypertension 02/23/2017    History reviewed. No pertinent surgical history.     Home Medications    Prior to Admission medications   Medication Sig Start Date End Date Taking? Authorizing Provider  lisinopril (PRINIVIL,ZESTRIL) 10 MG tablet Take 1 tablet (10 mg total) by mouth daily. 02/23/17  Yes Doles-Johnson, Teah, NP  Lurasidone HCl (LATUDA) 120 MG TABS Take 1 tablet by mouth daily.   Yes [provider]  mupirocin cream (BACTROBAN) 2 % Apply 1 application topically 2 (two) times daily. 11/03/17  Yes Eustace Moore, MD  cyclobenzaprine (FLEXERIL) 10 MG tablet Take 1 tablet (10 mg total) by mouth 2 (two) times daily as needed for muscle spasms. 11/17/17   Tyna Huertas C, PA-C  doxycycline (VIBRAMYCIN) 100 MG capsule Take 1 capsule (100 mg total) by mouth 2 (two) times daily for 10 days. 11/17/17 11/27/17  Toy Eisemann C, PA-C  hydrOXYzine (VISTARIL) 50 MG capsule Take 1 capsule (50 mg  total) by mouth 3 (three) times daily as needed. For itch or anxiety 11/17/17   Marianela Mandrell, Junius Creamer, PA-C    Family History Family History  Problem Relation Age of Onset  . Diabetes Mother   . Diabetes Father   . Heart attack Father   . Schizophrenia Other   . Other Neg Hx     Social History Social History   Tobacco Use  . Smoking status: Current Every Day Smoker    Packs/day: 1.00    Years: 20.00    Pack years: 20.00    Types: Cigarettes    Start date: 50  . Smokeless tobacco: Former Engineer, water Use Topics  . Alcohol use: Yes    Alcohol/week: 3.6 oz    Types: 6 Cans of beer per week  . Drug use: No     Allergies   Prednisone   Review of Systems Review of Systems  Constitutional: Negative for fatigue and fever.  Eyes: Negative for redness, itching and visual disturbance.  Respiratory: Negative for shortness of breath.   Cardiovascular: Negative for chest pain and leg swelling.  Gastrointestinal: Negative for nausea and vomiting.  Musculoskeletal: Positive for back pain, myalgias, neck pain and neck stiffness. Negative for arthralgias.  Skin: Positive for color change and rash. Negative for wound.  Neurological: Negative for dizziness, syncope, weakness, light-headedness and headaches.  Physical Exam Triage Vital Signs ED Triage Vitals  Enc Vitals Group     BP 11/17/17 1108 (!) 155/92     Pulse Rate 11/17/17 1108 87     Resp 11/17/17 1108 18     Temp 11/17/17 1108 98.6 F (37 C)     Temp src --      SpO2 11/17/17 1108 100 %     Weight --      Height --      Head Circumference --      Peak Flow --      Pain Score 11/17/17 1109 0     Pain Loc --      Pain Edu? --      Excl. in GC? --    No data found.  Updated Vital Signs BP (!) 155/92   Pulse 87   Temp 98.6 F (37 C)   Resp 18   SpO2 100%   Visual Acuity Right Eye Distance:   Left Eye Distance:   Bilateral Distance:    Right Eye Near:   Left Eye Near:    Bilateral Near:      Physical Exam  Constitutional: He appears well-developed and well-nourished.  HENT:  Head: Normocephalic and atraumatic.  Eyes: Conjunctivae are normal.  Neck: Neck supple.  Cardiovascular: Normal rate and regular rhythm.  No murmur heard. Pulmonary/Chest: Effort normal and breath sounds normal. No respiratory distress.  Musculoskeletal: He exhibits no edema.  Tenderness to palpation of bilateral trapezius muscles as well as bilateral lateral lumbar musculature.  Nontender to palpation of cervical, thoracic and lumbar spine midline.  Negative straight leg raise.  Ambulating without abnormality.  Neurological: He is alert.  Skin: Skin is warm and dry. There is erythema.  Erythematous area to left groin with central area from tick bite, no tick pieces appeared to be still embedded in skin.  Tender to area around the bite.  Psychiatric: He has a normal mood and affect.  Nursing note and vitals reviewed.    UC Treatments / Results  Labs (all labs ordered are listed, but only abnormal results are displayed) Labs Reviewed - No data to display  EKG None  Radiology No results found.  Procedures Procedures (including critical care time)  Medications Ordered in UC Medications - No data to display  Initial Impression / Assessment and Plan / UC Course  I have reviewed the triage vital signs and the nursing notes.  Pertinent labs & imaging results that were available during my care of the patient were reviewed by me and considered in my medical decision making (see chart for details).    Tick bite and redness is still present, will provide longer course of doxycycline for 10 days.  As well as provide refill of hydroxyzine for itching.  Advised patient that this should slowly continue to improve, but he should continue to monitor.  Discussed signs and symptoms of tickborne illnesses to watch out for.Discussed strict return precautions. Patient verbalized understanding and is agreeable  with plan.   Neck and back pain likely muscular, will recommend to continue NSAIDs as well as may supplement with Flexeril as needed.  Discussed sedation regarding Flexeril. Final Clinical Impressions(s) / UC Diagnoses   Final diagnoses:  Tick bite, subsequent encounter     Discharge Instructions     Please begin doxycycline and hydroxyzine  Redness should continue to improve   ED Prescriptions    Medication Sig Dispense Auth. Provider   doxycycline (VIBRAMYCIN) 100 MG capsule  Take 1 capsule (100 mg total) by mouth 2 (two) times daily for 10 days. 20 capsule Kallen Mccrystal C, PA-C   hydrOXYzine (VISTARIL) 50 MG capsule Take 1 capsule (50 mg total) by mouth 3 (three) times daily as needed. For itch or anxiety 30 capsule Naiara Lombardozzi C, PA-C   cyclobenzaprine (FLEXERIL) 10 MG tablet Take 1 tablet (10 mg total) by mouth 2 (two) times daily as needed for muscle spasms. 20 tablet Kolt Mcwhirter, [place] C, PA-C     Controlled Substance Prescriptions [place] Controlled Substance Registry consulted? Not Applicable   Lew Dawes, [place] 11/18/17 1140

## 2017-12-02 ENCOUNTER — Encounter: Payer: Self-pay | Admitting: Emergency Medicine

## 2017-12-02 ENCOUNTER — Emergency Department
Admission: EM | Admit: 2017-12-02 | Discharge: 2017-12-02 | Disposition: A | Discharge status: Home / Self Care | Payer: Medicaid Other | Attending: Emergency Medicine | Admitting: Emergency Medicine

## 2017-12-02 ENCOUNTER — Other Ambulatory Visit: Payer: Self-pay

## 2017-12-02 DIAGNOSIS — Z79899 Other long term (current) drug therapy: Secondary | ICD-10-CM | POA: Insufficient documentation

## 2017-12-02 DIAGNOSIS — L089 Local infection of the skin and subcutaneous tissue, unspecified: Secondary | ICD-10-CM | POA: Insufficient documentation

## 2017-12-02 DIAGNOSIS — F17210 Nicotine dependence, cigarettes, uncomplicated: Secondary | ICD-10-CM | POA: Insufficient documentation

## 2017-12-02 DIAGNOSIS — R21 Rash and other nonspecific skin eruption: Secondary | ICD-10-CM | POA: Diagnosis present

## 2017-12-02 DIAGNOSIS — A499 Bacterial infection, unspecified: Secondary | ICD-10-CM | POA: Insufficient documentation

## 2017-12-02 DIAGNOSIS — B9689 Other specified bacterial agents as the cause of diseases classified elsewhere: Secondary | ICD-10-CM

## 2017-12-02 DIAGNOSIS — I10 Essential (primary) hypertension: Secondary | ICD-10-CM | POA: Diagnosis not present

## 2017-12-02 MED ORDER — HYDROXYZINE HCL 50 MG PO TABS: 50.0000 mg | ORAL_TABLET | Freq: Three times a day (TID) | ORAL | 0 refills | Status: DC | PRN

## 2017-12-02 MED ORDER — SULFAMETHOXAZOLE-TRIMETHOPRIM 800-160 MG PO TABS: 1.0000 | ORAL_TABLET | Freq: Two times a day (BID) | ORAL | 0 refills | Status: DC

## 2017-12-02 NOTE — ED Provider Notes (Signed)
[facility] Emergency Department Provider Note   ____________________________________________   First MD Initiated Contact with Patient 12/02/17 1216     (approximate)  I have reviewed the triage vital signs and the nursing notes.   HISTORY  Chief Complaint Insect Bite    HPI David Phillips is a 32 y.o. male patient complain of sore and itching to medial left thigh secondary to a tick bite 3 months ago.  Patient had 2 course of doxycycline but still believe part of the tick is retained.  Patient denies fever/chills.  Patient denies nausea vomiting.  Patient denies myalgia or arthralgia.  Patient also states that he is concerned he might have a fungal infection in his blood.  Patient that he patient has a history of athletes feet and a pharmacist told him that there is a pill that he could take to rid the fungal infection out of his blood.  No recent palliative measure for complaint.  Past Medical History:  Diagnosis Date  . Gastric ulcer   . Schizoaffective disorder [facility])     Patient Active Problem List   Diagnosis Date Noted  . Allergic rhinitis 03/23/2017  . Hypertension 02/23/2017    History reviewed. No pertinent surgical history.  Prior to Admission medications   Medication Sig Start Date End Date Taking? Authorizing Provider  cyclobenzaprine (FLEXERIL) 10 MG tablet Take 1 tablet (10 mg total) by mouth 2 (two) times daily as needed for muscle spasms. 11/17/17   Wieters, Hallie C, PA-C  hydrOXYzine (ATARAX/VISTARIL) 50 MG tablet Take 1 tablet (50 mg total) by mouth 3 (three) times daily as needed. 12/02/17   Joni Reining, PA-C  hydrOXYzine (VISTARIL) 50 MG capsule Take 1 capsule (50 mg total) by mouth 3 (three) times daily as needed. For itch or anxiety 11/17/17   Wieters, Hallie C, PA-C  lisinopril (PRINIVIL,ZESTRIL) 10 MG tablet Take 1 tablet (10 mg total) by mouth daily. 02/23/17   Doles-Johnson, Teah, NP  Lurasidone HCl (LATUDA) 120 MG TABS Take  1 tablet by mouth daily.    [provider]  mupirocin cream (BACTROBAN) 2 % Apply 1 application topically 2 (two) times daily. 11/03/17   Eustace Moore, MD  sulfamethoxazole-trimethoprim (BACTRIM DS,SEPTRA DS) 800-160 MG tablet Take 1 tablet by mouth 2 (two) times daily. 12/02/17   Joni Reining, PA-C    Allergies Prednisone  Family History  Problem Relation Age of Onset  . Diabetes Mother   . Diabetes Father   . Heart attack Father   . Schizophrenia Other   . Other Neg Hx     Social History Social History   Tobacco Use  . Smoking status: Current Every Day Smoker    Packs/day: 1.00    Years: 20.00    Pack years: 20.00    Types: Cigarettes    Start date: 51  . Smokeless tobacco: Former Engineer, water Use Topics  . Alcohol use: Yes    Alcohol/week: 3.6 oz    Types: 6 Cans of beer per week  . Drug use: No    Review of Systems Constitutional: No fever/chills Eyes: No visual changes. ENT: No sore throat. Cardiovascular: Denies chest pain. Respiratory: Denies shortness of breath. Gastrointestinal: No abdominal pain.  No nausea, no vomiting.  No diarrhea.  No constipation. Genitourinary: Negative for dysuria. Musculoskeletal: Negative for back pain. Skin: Negative for rash. Neurological: Negative for headaches, focal weakness or numbness. Psychiatric:Schizoaffective disorder Endocrine:Hypertension Allergic/Immunilogical: Prednisone ____________________________________________   PHYSICAL EXAM:  VITAL  SIGNS: ED Triage Vitals  Enc Vitals Group     BP 12/02/17 1219 (!) 162/94     Pulse Rate 12/02/17 1219 97     Resp 12/02/17 1219 18     Temp 12/02/17 1219 98.2 F (36.8 C)     Temp Source 12/02/17 1219 Oral     SpO2 12/02/17 1219 96 %     Weight 12/02/17 1220 180 lb (81.6 kg)     Height 12/02/17 1220 5\' 9"  (1.753 m)     Head Circumference --      Peak Flow --      Pain Score 12/02/17 1219 0     Pain Loc --      Pain Edu? --      Excl. in  GC? --    Constitutional: Alert and oriented. Well appearing and in no acute distress. Neck: No stridor.  Cardiovascular: Normal rate, regular rhythm. Grossly normal heart sounds.  Good peripheral circulation.  Elevated blood pressure Respiratory: Normal respiratory effort.  No retractions. Lungs CTAB. Musculoskeletal: No lower extremity tenderness nor edema.  No joint effusions. Neurologic:  Normal speech and language. No gross focal neurologic deficits are appreciated. No gait instability. Skin:  Skin is warm, dry and intact. No rash noted.  Patient has a scabbed over papular lesion medial thigh area.  Area is in constant contact with elastic band of underwear which accounts for the increased irritation. Psychiatric: Mood and affect are normal. Speech and behavior are normal.  ____________________________________________   LABS (all labs ordered are listed, but only abnormal results are displayed)  Labs Reviewed - No data to display ____________________________________________  EKG   ____________________________________________  RADIOLOGY  ED MD interpretation:    Official radiology report(s): No results found.  ____________________________________________   PROCEDURES  Procedure(s) performed: None  Procedures  Critical Care performed: No  ____________________________________________   INITIAL IMPRESSION / ASSESSMENT AND PLAN / ED COURSE  As part of my medical decision making, I reviewed the following data within the electronic MEDICAL RECORD NUMBER    Mild cellulitis left medial thigh.  Patient given discharge care instructions.  Patient given a prescription for Bactrim DS and Atarax.  Patient given consult information to follow with dermatology for his fungal concern.      ____________________________________________   FINAL CLINICAL IMPRESSION(S) / ED DIAGNOSES  Final diagnoses:  Bacterial skin infection     ED Discharge Orders        Ordered     sulfamethoxazole-trimethoprim (BACTRIM DS,SEPTRA DS) 800-160 MG tablet  2 times daily     12/02/17 1240    hydrOXYzine (ATARAX/VISTARIL) 50 MG tablet  3 times daily PRN     12/02/17 1240       Note:  This document was prepared using Dragon voice recognition software and may include unintentional dictation errors.    Joni ReiningSmith, Ronald K, PA-C 12/02/17 1255    Rockne MenghiniNorman, Anne-Caroline, MD 12/02/17 [phone]

## 2017-12-02 NOTE — ED Triage Notes (Signed)
States had tick bite, seen at [facility], has finished treatment, states bite site still itchy. States tick was on scrotum.

## 2017-12-02 NOTE — Discharge Instructions (Signed)
Take medication as directed.  Call [facility] on Monday morning to schedule appointment.

## 2017-12-11 ENCOUNTER — Ambulatory Visit: Payer: Self-pay | Admitting: Family Medicine

## 2018-02-09 ENCOUNTER — Ambulatory Visit: Payer: Self-pay | Admitting: Nurse Practitioner

## 2018-02-12 ENCOUNTER — Ambulatory Visit (INDEPENDENT_AMBULATORY_CARE_PROVIDER_SITE_OTHER): Payer: Medicaid Other | Admitting: Family Medicine

## 2018-02-12 ENCOUNTER — Encounter: Payer: Self-pay | Admitting: Family Medicine

## 2018-02-12 ENCOUNTER — Telehealth: Payer: Self-pay | Admitting: Family Medicine

## 2018-02-12 ENCOUNTER — Other Ambulatory Visit (HOSPITAL_COMMUNITY)
Admission: RE | Admit: 2018-02-12 | Discharge: 2018-02-12 | Disposition: A | Discharge status: Home / Self Care | Payer: Medicaid Other | Source: Ambulatory Visit | Attending: Family Medicine | Admitting: Family Medicine

## 2018-02-12 VITALS — BP 118/72 | HR 90 | Temp 98.9°F | Resp 18 | Ht 69.0 in | Wt 185.8 lb

## 2018-02-12 DIAGNOSIS — I10 Essential (primary) hypertension: Secondary | ICD-10-CM

## 2018-02-12 DIAGNOSIS — Z23 Encounter for immunization: Secondary | ICD-10-CM

## 2018-02-12 DIAGNOSIS — M25552 Pain in left hip: Secondary | ICD-10-CM | POA: Diagnosis not present

## 2018-02-12 DIAGNOSIS — F250 Schizoaffective disorder, bipolar type: Secondary | ICD-10-CM

## 2018-02-12 DIAGNOSIS — M545 Low back pain, unspecified: Secondary | ICD-10-CM | POA: Insufficient documentation

## 2018-02-12 DIAGNOSIS — Z716 Tobacco abuse counseling: Secondary | ICD-10-CM

## 2018-02-12 DIAGNOSIS — Z13220 Encounter for screening for lipoid disorders: Secondary | ICD-10-CM

## 2018-02-12 DIAGNOSIS — D72829 Elevated white blood cell count, unspecified: Secondary | ICD-10-CM | POA: Diagnosis not present

## 2018-02-12 DIAGNOSIS — E663 Overweight: Secondary | ICD-10-CM

## 2018-02-12 DIAGNOSIS — Z113 Encounter for screening for infections with a predominantly sexual mode of transmission: Secondary | ICD-10-CM

## 2018-02-12 DIAGNOSIS — R21 Rash and other nonspecific skin eruption: Secondary | ICD-10-CM

## 2018-02-12 DIAGNOSIS — G8929 Other chronic pain: Secondary | ICD-10-CM

## 2018-02-12 DIAGNOSIS — Z7689 Persons encountering health services in other specified circumstances: Secondary | ICD-10-CM

## 2018-02-12 DIAGNOSIS — R7309 Other abnormal glucose: Secondary | ICD-10-CM

## 2018-02-12 DIAGNOSIS — R739 Hyperglycemia, unspecified: Secondary | ICD-10-CM

## 2018-02-12 DIAGNOSIS — D729 Disorder of white blood cells, unspecified: Secondary | ICD-10-CM | POA: Insufficient documentation

## 2018-02-12 DIAGNOSIS — Z114 Encounter for screening for human immunodeficiency virus [HIV]: Secondary | ICD-10-CM

## 2018-02-12 MED ORDER — LISINOPRIL 10 MG PO TABS: 10.0000 mg | ORAL_TABLET | Freq: Every day | ORAL | 1 refills | Status: DC

## 2018-02-12 NOTE — Patient Instructions (Signed)
DASH Eating Plan DASH stands for "Dietary Approaches to Stop Hypertension." The DASH eating plan is a healthy eating plan that has been shown to reduce high blood pressure (hypertension). It may also reduce your risk for type 2 diabetes, heart disease, and stroke. The DASH eating plan may also help with weight loss. What are tips for following this plan? General guidelines  Avoid eating more than 2,300 mg (milligrams) of salt (sodium) a day. If you have hypertension, you may need to reduce your sodium intake to 1,500 mg a day.  Limit alcohol intake to no more than 1 drink a day for nonpregnant women and 2 drinks a day for men. One drink equals 12 oz of beer, 5 oz of wine, or 1 oz of hard liquor.  Work with your health care provider to maintain a healthy body weight or to lose weight. Ask what an ideal weight is for you.  Get at least 30 minutes of exercise that causes your heart to beat faster (aerobic exercise) most days of the week. Activities may include walking, swimming, or biking.  Work with your health care provider or diet and nutrition specialist (dietitian) to adjust your eating plan to your individual calorie needs. Reading food labels  Check food labels for the amount of sodium per serving. Choose foods with less than 5 percent of the Daily Value of sodium. Generally, foods with less than 300 mg of sodium per serving fit into this eating plan.  To find whole grains, look for the word "whole" as the first word in the ingredient list. Shopping  Buy products labeled as "low-sodium" or "no salt added."  Buy fresh foods. Avoid canned foods and premade or frozen meals. Cooking  Avoid adding salt when cooking. Use salt-free seasonings or herbs instead of table salt or sea salt. Check with your health care provider or pharmacist before using salt substitutes.  Do not fry foods. Cook foods using healthy methods such as baking, boiling, grilling, and broiling instead.  Cook with  heart-healthy oils, such as olive, canola, soybean, or sunflower oil. Meal planning   Eat a balanced diet that includes: ? 5 or more servings of fruits and vegetables each day. At each meal, try to fill half of your plate with fruits and vegetables. ? Up to 6-8 servings of whole grains each day. ? Less than 6 oz of lean meat, poultry, or fish each day. A 3-oz serving of meat is about the same size as a deck of cards. One egg equals 1 oz. ? 2 servings of low-fat dairy each day. ? A serving of nuts, seeds, or beans 5 times each week. ? Heart-healthy fats. Healthy fats called Omega-3 fatty acids are found in foods such as flaxseeds and coldwater fish, like sardines, salmon, and mackerel.  Limit how much you eat of the following: ? Canned or prepackaged foods. ? Food that is high in trans fat, such as fried foods. ? Food that is high in saturated fat, such as fatty meat. ? Sweets, desserts, sugary drinks, and other foods with added sugar. ? Full-fat dairy products.  Do not salt foods before eating.  Try to eat at least 2 vegetarian meals each week.  Eat more home-cooked food and less restaurant, buffet, and fast food.  When eating at a restaurant, ask that your food be prepared with less salt or no salt, if possible. What foods are recommended? The items listed may not be a complete list. Talk with your dietitian about what   dietary choices are best for you. Grains Whole-grain or whole-wheat bread. Whole-grain or whole-wheat pasta. Brown rice. Oatmeal. Quinoa. Bulgur. Whole-grain and low-sodium cereals. Pita bread. Low-fat, low-sodium crackers. Whole-wheat flour tortillas. Vegetables Fresh or frozen vegetables (raw, steamed, roasted, or grilled). Low-sodium or reduced-sodium tomato and vegetable juice. Low-sodium or reduced-sodium tomato sauce and tomato paste. Low-sodium or reduced-sodium canned vegetables. Fruits All fresh, dried, or frozen fruit. Canned fruit in natural juice (without  added sugar). Meat and other protein foods Skinless chicken or turkey. Ground chicken or turkey. Pork with fat trimmed off. Fish and seafood. Egg whites. Dried beans, peas, or lentils. Unsalted nuts, nut butters, and seeds. Unsalted canned beans. Lean cuts of beef with fat trimmed off. Low-sodium, lean deli meat. Dairy Low-fat (1%) or fat-free (skim) milk. Fat-free, low-fat, or reduced-fat cheeses. Nonfat, low-sodium ricotta or cottage cheese. Low-fat or nonfat yogurt. Low-fat, low-sodium cheese. Fats and oils Soft margarine without trans fats. Vegetable oil. Low-fat, reduced-fat, or light mayonnaise and salad dressings (reduced-sodium). Canola, safflower, olive, soybean, and sunflower oils. Avocado. Seasoning and other foods Herbs. Spices. Seasoning mixes without salt. Unsalted popcorn and pretzels. Fat-free sweets. What foods are not recommended? The items listed may not be a complete list. Talk with your dietitian about what dietary choices are best for you. Grains Baked goods made with fat, such as croissants, muffins, or some breads. Dry pasta or rice meal packs. Vegetables Creamed or fried vegetables. Vegetables in a cheese sauce. Regular canned vegetables (not low-sodium or reduced-sodium). Regular canned tomato sauce and paste (not low-sodium or reduced-sodium). Regular tomato and vegetable juice (not low-sodium or reduced-sodium). Pickles. Olives. Fruits Canned fruit in a light or heavy syrup. Fried fruit. Fruit in cream or butter sauce. Meat and other protein foods Fatty cuts of meat. Ribs. Fried meat. Bacon. Sausage. Bologna and other processed lunch meats. Salami. Fatback. Hotdogs. Bratwurst. Salted nuts and seeds. Canned beans with added salt. Canned or smoked fish. Whole eggs or egg yolks. Chicken or turkey with skin. Dairy Whole or 2% milk, cream, and half-and-half. Whole or full-fat cream cheese. Whole-fat or sweetened yogurt. Full-fat cheese. Nondairy creamers. Whipped toppings.  Processed cheese and cheese spreads. Fats and oils Butter. Stick margarine. Lard. Shortening. Ghee. Bacon fat. Tropical oils, such as coconut, palm kernel, or palm oil. Seasoning and other foods Salted popcorn and pretzels. Onion salt, garlic salt, seasoned salt, table salt, and sea salt. Worcestershire sauce. Tartar sauce. Barbecue sauce. Teriyaki sauce. Soy sauce, including reduced-sodium. Steak sauce. Canned and packaged gravies. Fish sauce. Oyster sauce. Cocktail sauce. Horseradish that you find on the shelf. Ketchup. Mustard. Meat flavorings and tenderizers. Bouillon cubes. Hot sauce and Tabasco sauce. Premade or packaged marinades. Premade or packaged taco seasonings. Relishes. Regular salad dressings. Where to find more information:  National Heart, Lung, and Blood Institute: www.nhlbi.nih.gov  American Heart Association: www.heart.org Summary  The DASH eating plan is a healthy eating plan that has been shown to reduce high blood pressure (hypertension). It may also reduce your risk for type 2 diabetes, heart disease, and stroke.  With the DASH eating plan, you should limit salt (sodium) intake to 2,300 mg a day. If you have hypertension, you may need to reduce your sodium intake to 1,500 mg a day.  When on the DASH eating plan, aim to eat more fresh fruits and vegetables, whole grains, lean proteins, low-fat dairy, and heart-healthy fats.  Work with your health care provider or diet and nutrition specialist (dietitian) to adjust your eating plan to your individual   calorie needs. This information is not intended to replace advice given to you by your health care provider. Make sure you discuss any questions you have with your health care provider. Document Released: 05/26/2011 Document Revised: 05/30/2016 Document Reviewed: 05/30/2016 Elsevier Interactive Patient Education  2018 Elsevier Inc.  

## 2018-02-12 NOTE — Progress Notes (Signed)
Name: David Phillips   MRN: [id number]    DOB: 03/25/1986   Date:02/12/2018       Progress Note  Subjective  Chief Complaint  Chief Complaint  Patient presents with  . Establish Care  . Insect Bite    check tick bite seen at ED  . Abnormal Labs    WBC was elevated  . Referral    dermatology    HPI  Pt presents to establish care and for the following:  Social: [place]; used to live in the area.  Elevated WBC: Chronically elevated for at least 4 years - labs in our system show elevation from 6 years ago.  He denies fatigue (questions if this is related to his medications)  Tick bite:  He reports being treated for Lyme Disease recently - reports taking 2 rounds of Doxycyline, then had infection at the site of the bite and took Bactrim as prescribed for cellulitis.  Bite location was in the groin area - he denies pain at the site of the bite, but is unable to see the area himself.  Chronic Pain: LEFT hip - he states he has chronic pain, and reports history of an MRI after having a fall and was told he had inflammation in the hip.  Also states he has "a dislocated disc" in his back.  He mentions multiple times that he was prescribed hydrocodone at his previous practice.  Advised on our office policy regarding opiate therapy and offered pain management and/or ortho referral.   Recurrent Rash: He would like referral to dermatologist for recurrent pustular lesions to to bilateral fingers.  He notes when the lesions occur, there is clear fluid inside.  Lesions are itchy when they appear but not painful.  History of Syphilis Exposure: Notes history of testing positive for syphilis in the past; we will check this today along with other STI's per orders. Denies penile discharge or dysuria.  Has had 2 partners in the last year - women only.  Health Maintenance: TDAP and Flu Shot today.   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  He is doing well on Latuda and hydroxyzine. Denies hallucinations, SI or HI.  HTN:  -does take medications as prescribed - current regimen includes Lisinopril 10 mg daily.  - taking medications as instructed, no medication side effects noted, no TIAs, no chest pain on exertion, no dyspnea on exertion, no swelling of ankles - DASH diet discussed - pt does follow a low sodium diet; salt shaker not on table - The following  are not contributing factors: stress, sedentary lifestyle, corticosteroid use, saturated fats in diet, sweets including black licorice, smoking, sleep apnea, decongestant use.  Smoking: He currently smokes 1 packs per day. Cessation Techniques Discussed: removing cigarettes and smoking materials from environment and stress management I advised patient to quit, and offered support. Discussed current use pattern. Asked patient to inform me when they set a quit date. Has tried to quit in the past and was very irritable and gained a lot of weight.   Overweight: Body mass index is 27.44 kg/m. Diet: Eats whatever he wants Exercise: moderately active Co-Morbid Conditions: hypertension; 2 or more of these conditions combined with BMI >30 is considered morbid obesity; is this diagnosis appropriate and/or added to patient's problem list? No   Patient Active Problem List   Diagnosis Date Noted  .  Allergic rhinitis 03/23/2017  . Hypertension 02/23/2017    No past surgical history on file.  Family History  Problem Relation Age of Onset  . Diabetes Mother   . Diabetes Father   . Heart attack Father   . Schizophrenia Other   . Other Neg Hx     Social History   Socioeconomic History  . Marital status: Single    Spouse name: Not on file  . Number of children: 0  . Years of education: Not on file  . Highest education level: Not on file  Occupational History  . Occupation: looking for a Administrator, sports job  .  Occupation: disability  Social Needs  . Financial resource strain: Somewhat hard  . Food insecurity:    Worry: Sometimes true    Inability: Sometimes true  . Transportation needs:    Medical: Yes    Non-medical: Yes  Tobacco Use  . Smoking status: Current Every Day Smoker    Packs/day: 1.00    Years: 20.00    Pack years: 20.00    Types: Cigarettes    Start date: 35  . Smokeless tobacco: Former Engineer, water and Sexual Activity  . Alcohol use: Yes    Alcohol/week: 6.0 standard drinks    Types: 6 Cans of beer per week  . Drug use: No  . Sexual activity: Not on file  Lifestyle  . Physical activity:    Days per week: 0 days    Minutes per session: 0 min  . Stress: Rather much  Relationships  . Social connections:    Talks on phone: Not on file    Gets together: Not on file    Attends religious service: Not on file    Active member of club or organization: Not on file    Attends meetings of clubs or organizations: Not on file    Relationship status: Not on file  . Intimate partner violence:    Fear of current or ex partner: Not on file    Emotionally abused: Not on file    Physically abused: Not on file    Forced sexual activity: Not on file  Other Topics Concern  . Not on file  Social History Narrative  . Not on file     Current Outpatient Medications:  .  hydrOXYzine (ATARAX/VISTARIL) 50 MG tablet, Take 1 tablet (50 mg total) by mouth 3 (three) times daily as needed., Disp: 30 tablet, Rfl: 0 .  lisinopril (PRINIVIL,ZESTRIL) 10 MG tablet, Take 1 tablet (10 mg total) by mouth daily., Disp: 30 tablet, Rfl: 4 .  Lurasidone HCl (LATUDA) 120 MG TABS, Take 1 tablet by mouth daily., Disp: , Rfl:  .  cyclobenzaprine (FLEXERIL) 10 MG tablet, Take 1 tablet (10 mg total) by mouth 2 (two) times daily as needed for muscle spasms. (Patient not taking: Reported on 02/12/2018), Disp: 20 tablet, Rfl: 0 .  hydrOXYzine (VISTARIL) 50 MG capsule, Take 1 capsule (50 mg total) by mouth 3  (three) times daily as needed. For itch or anxiety, Disp: 30 capsule, Rfl: 0 .  mupirocin cream (BACTROBAN) 2 %, Apply 1 application topically 2 (two) times daily. (Patient not taking: Reported on 02/12/2018), Disp: 15 g, Rfl: 0 .  sulfamethoxazole-trimethoprim (BACTRIM DS,SEPTRA DS) 800-160 MG tablet, Take 1 tablet by mouth 2 (two) times daily. (Patient not taking: Reported on 02/12/2018), Disp: 20 tablet, Rfl: 0  Allergies  Allergen Reactions  . Prednisone     ROS Ten systems reviewed and is negative except  as mentioned in HPI.  Objective  Vitals:   02/12/18 1311  BP: 118/72  Pulse: 90  Resp: 18  Temp: 98.9 F (37.2 C)  TempSrc: Oral  SpO2: 97%  Weight: 185 lb 12.8 oz (84.3 kg)  Height: 5\' 9"  (1.753 m)   Body mass index is 27.44 kg/m.  Physical Exam Constitutional: Patient appears well-developed and well-nourished. No distress.  HENT: Head: Normocephalic and atraumatic. Nose: Nose normal. Mouth/Throat: Oropharynx is clear and moist. No oropharyngeal exudate.  Eyes: Conjunctivae and EOM are normal. Pupils are equal, round, and reactive to light. No scleral icterus.  Neck: Normal range of motion. Neck supple. No JVD present. No thyromegaly present.  Cardiovascular: Normal rate, regular rhythm and normal heart sounds.  No murmur heard. No BLE edema. Pulmonary/Chest: Effort normal and breath sounds normal. No respiratory distress. MALE GENITALIA: Normal descended testes bilaterally, no masses palpated, no hernias, no lesions, no discharge. No erythema or edema. Musculoskeletal: Normal range of motion, no joint effusions. No gross deformities Neurological: he is alert and oriented to person, place, and time. No cranial nerve deficit. Coordination, balance, strength, speech and gait are normal.  Skin: Skin is warm and dry. No rash noted. No erythema.  Psychiatric: Patient has a anxious mood and affect. behavior is normal. Judgment and thought content normal. Speech is slightly  pressured.  No results found for this or any previous visit (from the past 72 hour(s)).  PHQ2/9: Depression screen PHQ 2/9 02/12/2018  Decreased Interest 0  Down, Depressed, Hopeless 0  PHQ - 2 Score 0  Altered sleeping 0  Tired, decreased energy 1  Change in appetite 0  Feeling bad or failure about yourself  0  Trouble concentrating 0  Moving slowly or fidgety/restless 0  Suicidal thoughts 0   Fall Risk: Fall Risk  02/12/2018  Falls in the past year? No   Assessment & Plan  1. Leukocytosis, unspecified type - CBC w/Diff/Platelet - Advised if still elevated in the absence of infection, will consider referral to hematology  2. Chronic left hip pain - Ambulatory referral to Pain Clinic 3. Chronic left-sided low back pain without sciatica - Ambulatory referral to Pain Clinic - Advised patient that referral to pain clinic does not guarantee opiate therapy, and he verbalizes understanding.  4. Rash and nonspecific skin eruption - Ambulatory referral to Dermatology - Rash is not present at this time, so it is difficult to discern the nature of the lesions; I did advise he return for a same-day appointment should he develop the rash while awaiting dermatology referral.  5. Screening for HIV without presence of risk factors - HIV antibody  6. Screen for STD (sexually transmitted disease) - HIV antibody - RPR - Cytology (oral, anal, urethral) ancillary only  7. Schizoaffective disorder, bipolar type (HCC) - Keep psychiatric follow up; continue medications  8. Encounter for tobacco use cessation counseling - Smoking cessation instruction/counseling given:  counseled patient on the dangers of tobacco use, advised patient to stop smoking, and reviewed strategies to maximize success - I spent approximately 4 minutes counseling the patient on cessation.  9. Overweight (BMI 25.0-29.9) - Discussed importance of 150 minutes of physical activity weekly, eat two servings of fish  weekly, eat one serving of tree nuts (cashews, pistachios, pecans, almonds) every other day, eat 6 servings of fruit/vegetables daily and drink plenty of water and avoid sweet beverages. - CBC w/Diff/Platelet - COMPLETE METABOLIC PANEL WITH GFR - Lipid panel  10. Essential hypertension - COMPLETE METABOLIC PANEL  WITH GFR - lisinopril (PRINIVIL,ZESTRIL) 10 MG tablet; Take 1 tablet (10 mg total) by mouth daily.  Dispense: 90 tablet; Refill: 1  11. Encounter to establish care  12. Elevated random blood glucose level - COMPLETE METABOLIC PANEL WITH GFR  13. Lipid screening - Lipid panel  14. Need for Tdap vaccination - Tdap vaccine greater than or equal to 7yo IM  15. Needs flu shot - Flu Vaccine QUAD 6+ mos PF IM (Fluarix Quad PF)  Return in about 1 month (around 03/15/2018) for CPE.

## 2018-02-12 NOTE — Telephone Encounter (Signed)
Please request records from [facility] from previous PCP - most recent visit, most recent physical, and most recent labs/imaging. Thanks!

## 2018-02-13 ENCOUNTER — Ambulatory Visit: Payer: Self-pay

## 2018-02-13 ENCOUNTER — Ambulatory Visit: Payer: Self-pay | Admitting: *Deleted

## 2018-02-13 LAB — LIPID PANEL
Cholesterol: 221 mg/dL — ABNORMAL HIGH (ref <200)
HDL: 42 mg/dL (ref >40)
LDL CHOLESTEROL (CALC): 140 mg/dL — AB
Non-HDL Cholesterol (Calc): 179 mg/dL (calc) — ABNORMAL HIGH (ref <130)
TRIGLYCERIDES: 242 mg/dL — AB (ref <150)
Total CHOL/HDL Ratio: 5.3 (calc) — ABNORMAL HIGH (ref <5.0)

## 2018-02-13 LAB — COMPLETE METABOLIC PANEL WITH GFR
AG Ratio: 1.8 (calc) (ref 1.0–2.5)
ALKALINE PHOSPHATASE (APISO): 77 U/L (ref 40–115)
ALT: 28 U/L (ref 9–46)
AST: 22 U/L (ref 10–40)
Albumin: 4.6 g/dL (ref 3.6–5.1)
BILIRUBIN TOTAL: 0.4 mg/dL (ref 0.2–1.2)
BUN: 7 mg/dL (ref 7–25)
CHLORIDE: 101 mmol/L (ref 98–110)
CO2: 26 mmol/L (ref 20–32)
Calcium: 9.7 mg/dL (ref 8.6–10.3)
Creat: 1.16 mg/dL (ref 0.60–1.35)
GFR, Est African American: 96 mL/min/{1.73_m2} (ref >=60)
GFR, Est Non African American: 83 mL/min/{1.73_m2} (ref >=60)
GLUCOSE: 88 mg/dL (ref 65–139)
Globulin: 2.5 g/dL (calc) (ref 1.9–3.7)
Potassium: 4.4 mmol/L (ref 3.5–5.3)
Sodium: 137 mmol/L (ref 135–146)
Total Protein: 7.1 g/dL (ref 6.1–8.1)

## 2018-02-13 LAB — CBC WITH DIFFERENTIAL/PLATELET
BASOS PCT: 0.9 %
Basophils Absolute: 117 cells/uL (ref 0–200)
Eosinophils Absolute: 364 cells/uL (ref 15–500)
Eosinophils Relative: 2.8 %
HCT: 48.5 % (ref 38.5–50.0)
HEMOGLOBIN: 17.3 g/dL — AB (ref 13.2–17.1)
Lymphs Abs: 2314 cells/uL (ref 850–3900)
MCH: 32.5 pg (ref 27.0–33.0)
MCHC: 35.7 g/dL (ref 32.0–36.0)
MCV: 91 fL (ref 80.0–100.0)
MPV: 10.1 fL (ref 7.5–12.5)
Monocytes Relative: 5.7 %
Neutro Abs: 9464 cells/uL — ABNORMAL HIGH (ref 1500–7800)
Neutrophils Relative %: 72.8 %
Platelets: 335 10*3/uL (ref 140–400)
RBC: 5.33 10*6/uL (ref 4.20–5.80)
RDW: 14.8 % (ref 11.0–15.0)
TOTAL LYMPHOCYTE: 17.8 %
WBC: 13 10*3/uL — AB (ref 3.8–10.8)
WBCMIX: 741 {cells}/uL (ref 200–950)

## 2018-02-13 LAB — CYTOLOGY, (ORAL, ANAL, URETHRAL) ANCILLARY ONLY
CHLAMYDIA, DNA PROBE: NEGATIVE
Neisseria Gonorrhea: NEGATIVE
TRICH (WINDOWPATH): NEGATIVE

## 2018-02-13 LAB — HIV ANTIBODY (ROUTINE TESTING W REFLEX): HIV 1&2 Ab, 4th Generation: NONREACTIVE

## 2018-02-13 LAB — RPR: RPR: NONREACTIVE

## 2018-02-13 NOTE — Telephone Encounter (Signed)
Incoming call from patient with complaint of lower back pain near his kidneys wanted to know if he should return sooner to earlier than 03/19/18. States the pain began more than a moth ago.  Rates this  The pain moderate.  States that it comes and goes.  But each time it returns the pain has increased.  Pain does not radiate.  Patient does not know what is causing it . States he does drink a lot of sodas and alcohol. Is attempting to decrease intake. Has not recently lifted any heavy objects. Has taken Advil, Tylenol,   Has not helped.   Denies numbness, weakness occasionally.  Denies abdominal pain, burning with urination, or blood in the urine.  . Patient would like to know if Maurice Smallmily Boyce recommends patient to come in before 03/19/18. Patient awaits phone call related to his question.  Can be reached at [phone] after 3;30pm Reason for Disposition . [1] Age > 50 AND [2] no history of prior similar back pain  Answer Assessment - Initial Assessment Questions 1. ONSET: "When did the pain begin?"      *No Answer*more than a month 2. LOCATION: "Where does it hurt?" (upper, mid or lower back)     Lower near kidney 3. SEVERITY: "How bad is the pain?"  (e.g., Scale 1-10; mild, moderate, or severe)   - MILD (1-3): doesn't interfere with normal activities    - MODERATE (4-7): interferes with normal activities or awakens from sleep    - SEVERE (8-10): excruciating pain, unable to do any normal activities      moderate 4. PATTERN: "Is the pain constant?" (e.g., yes, no; constant, intermittent)      Comes and goes  Increases each 5. RADIATION: "Does the pain shoot into your legs or elsewhere?"     no 6. CAUSE:  "What do you think is causing the back pain?"       Drink sodas, drinks alcohol 7. BACK OVERUSE:  "Any recent lifting of heavy objects, strenuous work or exercise?"     no 8. MEDICATIONS: "What have you taken so far for the pain?" (e.g., nothing, acetaminophen, NSAIDS)     Advil, tylenol,, no  help 9. NEUROLOGIC SYMPTOMS: "Do you have any weakness, numbness, or problems with bowel/bladder control?"    Not sure 10. OTHER SYMPTOMS: "Do you have any other symptoms?" (e.g., fever, abdominal pain, burning with urination, blood in urine)       no 11. PREGNANCY: "Is there any chance you are pregnant?" (e.g., yes, no; LMP)       na  Protocols used: BACK PAIN-A-AH

## 2018-02-13 NOTE — Telephone Encounter (Signed)
Patient is not seen here in this office. I think he is a patient at [facility]. Thank you

## 2018-02-14 ENCOUNTER — Other Ambulatory Visit: Payer: Self-pay | Admitting: Family Medicine

## 2018-02-14 DIAGNOSIS — D72829 Elevated white blood cell count, unspecified: Secondary | ICD-10-CM

## 2018-02-14 NOTE — Telephone Encounter (Signed)
Sorry

## 2018-02-14 NOTE — Telephone Encounter (Signed)
Patient seen on Monday 8/26

## 2018-02-14 NOTE — Telephone Encounter (Signed)
Patient called back.  See triage encounter.

## 2018-02-17 ENCOUNTER — Encounter: Payer: Self-pay | Admitting: Family Medicine

## 2018-02-21 ENCOUNTER — Encounter: Payer: Self-pay | Admitting: Internal Medicine

## 2018-02-21 ENCOUNTER — Other Ambulatory Visit: Payer: Self-pay

## 2018-02-21 ENCOUNTER — Inpatient Hospital Stay: Payer: Medicaid Other | Attending: Oncology | Admitting: Internal Medicine

## 2018-02-21 VITALS — BP 150/90 | HR 86 | Temp 97.6°F | Resp 20 | Ht 69.0 in | Wt 187.0 lb

## 2018-02-21 DIAGNOSIS — Z79899 Other long term (current) drug therapy: Secondary | ICD-10-CM | POA: Insufficient documentation

## 2018-02-21 DIAGNOSIS — F17210 Nicotine dependence, cigarettes, uncomplicated: Secondary | ICD-10-CM | POA: Insufficient documentation

## 2018-02-21 DIAGNOSIS — D72829 Elevated white blood cell count, unspecified: Secondary | ICD-10-CM | POA: Diagnosis not present

## 2018-02-21 DIAGNOSIS — F259 Schizoaffective disorder, unspecified: Secondary | ICD-10-CM | POA: Insufficient documentation

## 2018-02-21 DIAGNOSIS — D72828 Other elevated white blood cell count: Secondary | ICD-10-CM | POA: Diagnosis not present

## 2018-02-21 DIAGNOSIS — D729 Disorder of white blood cells, unspecified: Secondary | ICD-10-CM

## 2018-02-21 DIAGNOSIS — R05 Cough: Secondary | ICD-10-CM

## 2018-02-21 DIAGNOSIS — R110 Nausea: Secondary | ICD-10-CM

## 2018-02-21 NOTE — Progress Notes (Signed)
Patient has seen a hematologist about 3.5 years ago at [facility].  Patient is an active smoker. He has smoked 1 pkd/approx. 20 years. Patient states that he is "worried about his HIV testing results and the accuracy."  Pt admits to "possible heroin use the past, but I can not recall if I actually did that or not. I could have had used some dirty needles in the past. I don't know."

## 2018-02-21 NOTE — Progress Notes (Signed)
[facility] NOTE  Patient Care Team: Hubbard Hartshorn, FNP as PCP - General (Family Medicine)  CHIEF COMPLAINTS/PURPOSE OF CONSULTATION: LEUCOCYTOSIS  # LEUCOCYTOSIS/ neutrophilia [since 2015; 13-14 s/p "work up for leukemia at [facility] 2014/2015"]  # Hx of smoking/ drug abuse; Schizoaffetctive disorder [Dr.M]  No history exists.     HISTORY OF PRESENTING ILLNESS:  David Phillips 32 y.o.  male with a history of psychiatric illness; active smoker has been referred to [place] for further evaluation recommendations for ongoing leukocytosis.  Patient denies any unusual weight loss.  Denies any drainage or night sweats.  He has intermittent cough and positive for nausea.  Patient states that he was tested for leukemia 3 to 4 years ago in [place] to be negative.  He states his counts are gone up to 24 at the time.  He did not have a bone marrow biopsy.  Review of Systems  Constitutional: Negative for chills, diaphoresis, fever, malaise/fatigue and weight loss.  HENT: Negative for nosebleeds and sore throat.   Eyes: Negative for double vision.  Respiratory: Positive for cough. Negative for hemoptysis, sputum production, shortness of breath and wheezing.   Cardiovascular: Negative for chest pain, palpitations, orthopnea and leg swelling.  Gastrointestinal: Positive for heartburn. Negative for abdominal pain, blood in stool, constipation, diarrhea, melena, nausea and vomiting.  Genitourinary: Negative for dysuria, frequency and urgency.  Musculoskeletal: Negative for back pain and joint pain.  Skin: Negative.  Negative for itching and rash.  Neurological: Negative for dizziness, tingling, focal weakness, weakness and headaches.  Endo/Heme/Allergies: Does not bruise/bleed easily.  Psychiatric/Behavioral: Negative for depression. The patient is nervous/anxious. The patient does not have insomnia.      MEDICAL HISTORY:  Past Medical History:  Diagnosis Date  . Gastric ulcer    . Schizoaffective disorder ([place])     SURGICAL HISTORY: History reviewed. No pertinent surgical history.  SOCIAL HISTORY: active smoker/ alcohol/beer "too much"; on disability sec to psychiatric illness. Social History   Socioeconomic History  . Marital status: Single    Spouse name: Not on file  . Number of children: 0  . Years of education: Not on file  . Highest education level: Not on file  Occupational History  . Occupation: looking for a Lobbyist job  . Occupation: disability  Social Needs  . Financial resource strain: Somewhat hard  . Food insecurity:    Worry: Sometimes true    Inability: Sometimes true  . Transportation needs:    Medical: Yes    Non-medical: Yes  Tobacco Use  . Smoking status: Current Every Day Smoker    Packs/day: 1.00    Years: 20.00    Pack years: 20.00    Types: Cigarettes    Start date: 88  . Smokeless tobacco: Former Network engineer and Sexual Activity  . Alcohol use: Yes    Alcohol/week: 6.0 standard drinks    Types: 6 Cans of beer per week  . Drug use: Yes    Comment: h/o drug use in the past.  . Sexual activity: Not on file  Lifestyle  . Physical activity:    Days per week: 0 days    Minutes per session: 0 min  . Stress: Rather much  Relationships  . Social connections:    Talks on phone: Not on file    Gets together: Not on file    Attends religious service: Not on file    Active member of club or organization: Not on file  Attends meetings of clubs or organizations: Not on file    Relationship status: Not on file  . Intimate partner violence:    Fear of current or ex partner: Not on file    Emotionally abused: Not on file    Physically abused: Not on file    Forced sexual activity: Not on file  Other Topics Concern  . Not on file  Social History Narrative  . Not on file    FAMILY HISTORY: Family History  Problem Relation Age of Onset  . Diabetes Mother   . Diabetes Father   . Heart attack Father   .  Schizophrenia Other   . Other Neg Hx     ALLERGIES:  is allergic to prednisone.  MEDICATIONS:  Current Outpatient Medications  Medication Sig Dispense Refill  . hydrOXYzine (ATARAX/VISTARIL) 50 MG tablet Take 1 tablet (50 mg total) by mouth 3 (three) times daily as needed. 30 tablet 0  . lisinopril (PRINIVIL,ZESTRIL) 10 MG tablet Take 1 tablet (10 mg total) by mouth daily. 90 tablet 1  . Lurasidone HCl (LATUDA) 120 MG TABS Take 1 tablet by mouth daily.     No current facility-administered medications for this visit.       Marland Kitchen  PHYSICAL EXAMINATION: ECOG PERFORMANCE STATUS: 0 - Asymptomatic  Vitals:   02/21/18 1426 02/21/18 1445  BP: (!) 156/106 (!) 150/90  Pulse: 86   Resp: 20   Temp: 97.6 F (36.4 C)    Filed Weights   02/21/18 1428  Weight: 187 lb (84.8 kg)    Physical Exam  Constitutional: He is oriented to person, place, and time and well-developed, well-nourished, and in no distress.  HENT:  Head: Normocephalic and atraumatic.  Mouth/Throat: Oropharynx is clear and moist. No oropharyngeal exudate.  Eyes: Pupils are equal, round, and reactive to light.  Neck: Normal range of motion. Neck supple.  Cardiovascular: Normal rate and regular rhythm.  Pulmonary/Chest: No respiratory distress. He has no wheezes.  Abdominal: Soft. Bowel sounds are normal. He exhibits no distension and no mass. There is no tenderness. There is no rebound and no guarding.  Musculoskeletal: Normal range of motion. He exhibits no edema or tenderness.  Neurological: He is alert and oriented to person, place, and time.  Skin: Skin is warm.  Psychiatric: Affect normal.     LABORATORY DATA:  I have reviewed the data as listed Lab Results  Component Value Date   WBC 13.0 (H) 02/12/2018   HGB 17.3 (H) 02/12/2018   HCT 48.5 02/12/2018   MCV 91.0 02/12/2018   PLT 335 02/12/2018   Recent Labs    02/23/17 1853 02/12/18 1422  NA 141 137  K 5.0 4.4  CL 98 101  CO2 26 26  GLUCOSE 116* 88   BUN 8 7  CREATININE 1.11 1.16  CALCIUM 9.6 9.7  GFRNONAA 88 83  GFRAA 102 96  PROT 7.1 7.1  ALBUMIN 4.5  --   AST 18 22  ALT 22 28  ALKPHOS 83  --   BILITOT <0.2 0.4    RADIOGRAPHIC STUDIES: I have personally reviewed the radiological images as listed and agreed with the findings in the report. No results found.  ASSESSMENT & PLAN:   Neutrophilia # MILD leukocytosis/neutrophilia-stable since 2015; rest of the CBC within normal limits.  Will hold off any work-up at this time.  We will review the records from [facility] [3 to 4 years prior].  The fact that his neutrophilia is fairly stable for the  last 4 to 5 years-this is reassuring.  This is likely to secondary causes like smoking.   # smoking-encouraged patient to quit smoking; discussed at length regarding adverse medical complications of smoking.  # follow up 4 months/cbc.   Thank you Ms.[name] NP for allowing me to participate in the care of your pleasant patient. Please do not hesitate to contact me with questions or concerns in the interim.  All questions were answered. The patient knows to call the clinic with any problems, questions or concerns.    Cammie Sickle, MD 02/21/2018 7:03 PM

## 2018-02-21 NOTE — Assessment & Plan Note (Addendum)
#   MILD leukocytosis/neutrophilia-stable since 2015; rest of the CBC within normal limits.  Will hold off any work-up at this time.  We will review the records from [facility] [3 to 4 years prior].  The fact that his neutrophilia is fairly stable for the last 4 to 5 years-this is reassuring.  This is likely to secondary causes like smoking.   # smoking-encouraged patient to quit smoking; discussed at length regarding adverse medical complications of smoking.  # follow up 4 months/cbc. Records at next visit.   Thank you Ms.Emily Boyce NP for allowing me to participate in the care of your pleasant patient. Please do not hesitate to contact me with questions or concerns in the interim.  

## 2018-02-23 ENCOUNTER — Encounter: Payer: Medicaid Other | Admitting: Oncology

## 2018-02-26 NOTE — Telephone Encounter (Signed)
patient must sign medical release

## 2018-03-19 ENCOUNTER — Encounter: Payer: Self-pay | Admitting: Family Medicine

## 2018-03-19 ENCOUNTER — Ambulatory Visit (INDEPENDENT_AMBULATORY_CARE_PROVIDER_SITE_OTHER): Payer: Medicaid Other | Admitting: Family Medicine

## 2018-03-19 VITALS — BP 122/76 | HR 98 | Temp 98.1°F | Resp 18 | Ht 69.0 in | Wt 185.5 lb

## 2018-03-19 DIAGNOSIS — Z0000 Encounter for general adult medical examination without abnormal findings: Secondary | ICD-10-CM | POA: Diagnosis not present

## 2018-03-19 DIAGNOSIS — F1011 Alcohol abuse, in remission: Secondary | ICD-10-CM

## 2018-03-19 DIAGNOSIS — Z87898 Personal history of other specified conditions: Secondary | ICD-10-CM

## 2018-03-19 DIAGNOSIS — E663 Overweight: Secondary | ICD-10-CM | POA: Diagnosis not present

## 2018-03-19 DIAGNOSIS — Z716 Tobacco abuse counseling: Secondary | ICD-10-CM | POA: Diagnosis not present

## 2018-03-19 DIAGNOSIS — S6991XA Unspecified injury of right wrist, hand and finger(s), initial encounter: Secondary | ICD-10-CM

## 2018-03-19 DIAGNOSIS — E785 Hyperlipidemia, unspecified: Secondary | ICD-10-CM | POA: Insufficient documentation

## 2018-03-19 DIAGNOSIS — E782 Mixed hyperlipidemia: Secondary | ICD-10-CM

## 2018-03-19 DIAGNOSIS — Z113 Encounter for screening for infections with a predominantly sexual mode of transmission: Secondary | ICD-10-CM

## 2018-03-19 MED ORDER — VARENICLINE TARTRATE 0.5 MG X 11 & 1 MG X 42 PO MISC: ORAL | 0 refills | Status: DC

## 2018-03-19 MED ORDER — MELOXICAM 15 MG PO TABS: 7.5000 mg | ORAL_TABLET | Freq: Every day | ORAL | 0 refills | Status: DC

## 2018-03-19 MED ORDER — ATORVASTATIN CALCIUM 20 MG PO TABS: 20.0000 mg | ORAL_TABLET | Freq: Every day | ORAL | 1 refills | Status: DC

## 2018-03-19 NOTE — Progress Notes (Signed)
Name: David Phillips   MRN: [id number]    DOB: [birth year]   Date:03/19/2018       Progress Note  Subjective  Chief Complaint  Chief Complaint  Patient presents with  . Annual Exam    HPI  Patient presents for annual CPE and the following.  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 Has had ongoing pain to the right medial anterior wrist.  Denies point tenderness.   Smoking cessation - he would like to try Chantix - took some of a family member's in the past and did well on this.  We will Rx today.  He is smoking 2ppd.  We will prescribe this today. USPSTF grade A and B recommendations:  Diet: Watching his salt intake (has HTN), cooking at home more often; avoiding fast food. Does eat some fresh fruits and vegetables. Exercise: Was going to [employer] but the bill was too much. Does walk every day.  Depression: Seeing Psychiatry at [facility]; Taking Latuda.  Depression screen [facility] 2/9 03/19/2018 02/12/2018  Decreased Interest 0 0  Down, Depressed, Hopeless 0 0  PHQ - 2 Score 0 0  Altered sleeping 0 0  Tired, decreased energy 0 1  Change in appetite 0 0  Feeling bad or failure about yourself  0 0  Trouble concentrating 0 0  Moving slowly or fidgety/restless 0 0  Suicidal thoughts 0 0  PHQ-9 Score 0 -   Hypertension: Taking lisinopril, DASH diet. BP Readings from Last 3 Encounters:  03/19/18 122/76  02/21/18 (!) 150/90  02/12/18 118/72   Obesity: Wt Readings from Last 3 Encounters:  03/19/18 185 lb 8 oz (84.1 kg)  02/21/18 187 lb (84.8 kg)  02/12/18 185 lb 12.8 oz (84.3 kg)   BMI Readings from Last 3 Encounters:  03/19/18 27.39 kg/m  02/21/18 27.62 kg/m  02/12/18 27.44 kg/m    Lipids: Dad had MI at age 37, and Paternal grandfather died at age 92 from MI. Lab Results  Component Value Date   CHOL 221 (H) 02/12/2018   CHOL 191 02/23/2017   Lab Results  Component Value Date   HDL 42  02/12/2018   HDL 36 (L) 02/23/2017   Lab Results  Component Value Date   LDLCALC 140 (H) 02/12/2018   LDLCALC 116 (H) 02/23/2017   Lab Results  Component Value Date   TRIG 242 (H) 02/12/2018   TRIG 195 (H) 02/23/2017   Lab Results  Component Value Date   CHOLHDL 5.3 (H) 02/12/2018   CHOLHDL 5.3 (H) 02/23/2017   No results found for: LDLDIRECT Glucose:  Glucose  Date Value Ref Range Status  02/23/2017 116 (H) 65 - 99 mg/dL Final  40/98/1191 93 65 - 99 mg/dL Final   Glucose, Bld  Date Value Ref Range Status  02/12/2018 88 65 - 139 mg/dL Final    Comment:    .        Non-fasting reference interval .   12/22/2016 103 (H) 65 - 99 mg/dL Final  47/82/9562 93 70 - 99 mg/dL Final     Office Visit from 03/19/2018 in [facility]  AUDIT-C Score  0    Drinking 2-40oz once every few weeks; trying to cut back on his ETOH intake over the last few months.  Trying to find more positive activities to focus his attention (reading the Bible, going to church, etc).  Single STD testing and  prevention (HIV/chl/gon/syphilis): Not currently sexually active; consistently uses condoms; has had multiple partners in the last year; negative STI testing at last visit Hep C: We will check today  Skin cancer: No concerning lesions. Colorectal cancer: No family history of colorectal cancer; no blood in stool or dark and tarry stools. Prostate cancer: no family history; no changes. Denies decreased force of urine stream, straining to urinate, frequent urination, or nocturia. No results found for: PSA   Lung cancer:  Does not qualify ECG:  None on file.  Advanced Care Planning: A voluntary discussion about advance care planning including the explanation and discussion of advance directives.  Discussed health care proxy and Living will, and the patient was unable to identify a health care proxy.  Patient does not have a living will at present time. If patient does have living will,  I have requested they bring this to the clinic to be scanned in to their chart.  Patient Active Problem List   Diagnosis Date Noted  . Hyperlipidemia 03/19/2018  . Chronic left hip pain 02/12/2018  . Chronic left-sided low back pain without sciatica 02/12/2018  . Neutrophilia 02/12/2018  . Schizoaffective disorder, bipolar type (HCC) 02/12/2018  . Encounter for tobacco use cessation counseling 02/12/2018  . Overweight (BMI 25.0-29.9) 02/12/2018  . Allergic rhinitis 03/23/2017  . Hypertension 02/23/2017   No past surgical history on file.  Family History  Problem Relation Age of Onset  . Diabetes Mother   . Diabetes Father   . Heart attack Father 63       Bypass surgeries; 3 MI's  . Schizophrenia Other   . Heart attack Paternal Grandfather 28  . Other Neg Hx     Social History   Socioeconomic History  . Marital status: Single    Spouse name: Not on file  . Number of children: 0  . Years of education: Not on file  . Highest education level: Not on file  Occupational History  . Occupation: looking for a Administrator, sports job  . Occupation: disability  Social Needs  . Financial resource strain: Somewhat hard  . Food insecurity:    Worry: Sometimes true    Inability: Sometimes true  . Transportation needs:    Medical: Yes    Non-medical: Yes  Tobacco Use  . Smoking status: Current Every Day Smoker    Packs/day: 1.00    Years: 20.00    Pack years: 20.00    Types: Cigarettes    Start date: 55  . Smokeless tobacco: Former Engineer, water and Sexual Activity  . Alcohol use: Yes    Alcohol/week: 6.0 standard drinks    Types: 6 Cans of beer per week  . Drug use: Yes    Comment: h/o drug use in the past.  . Sexual activity: Not Currently  Lifestyle  . Physical activity:    Days per week: 0 days    Minutes per session: 0 min  . Stress: Rather much  Relationships  . Social connections:    Talks on phone: Not on file    Gets together: Not on file    Attends religious  service: Not on file    Active member of club or organization: Not on file    Attends meetings of clubs or organizations: Not on file    Relationship status: Not on file  . Intimate partner violence:    Fear of current or ex partner: Not on file    Emotionally abused: Not on file  Physically abused: Not on file    Forced sexual activity: Not on file  Other Topics Concern  . Not on file  Social History Narrative  . Not on file    Current Outpatient Medications:  .  hydrOXYzine (ATARAX/VISTARIL) 50 MG tablet, Take 1 tablet (50 mg total) by mouth 3 (three) times daily as needed., Disp: 30 tablet, Rfl: 0 .  lisinopril (PRINIVIL,ZESTRIL) 10 MG tablet, Take 1 tablet (10 mg total) by mouth daily., Disp: 90 tablet, Rfl: 1 .  Lurasidone HCl (LATUDA) 120 MG TABS, Take 1 tablet by mouth daily., Disp: , Rfl:  .  betamethasone valerate (VALISONE) 0.1 % cream, APP AA ON HANDS D DURING DIFFICULT FLARES, Disp: , Rfl: 3 .  ELIDEL 1 % cream, APPLY ON THE AFFECTED SKIN D, Disp: , Rfl: 3 .  varenicline (CHANTIX STARTING MONTH PAK) 0.5 MG X 11 & 1 MG X 42 tablet, Take one 0.5 mg tablet by mouth once daily for 3 days, then increase to one 0.5 mg tablet twice daily for 4 days, then increase to one 1 mg tablet twice daily., Disp: 53 tablet, Rfl: 0  Allergies  Allergen Reactions  . Prednisone     ROS  Constitutional: Negative for fever or weight change.  Respiratory: Negative for cough and shortness of breath.   Cardiovascular: Negative for chest pain or palpitations.  Gastrointestinal: Negative for abdominal pain, no bowel changes.  Musculoskeletal: Negative for gait problem or joint swelling. See HPI Skin: Negative for rash.  Neurological: Negative for dizziness or headache.  No other specific complaints in a complete review of systems (except as listed in HPI above).  Objective  Vitals:   03/19/18 1316  BP: 122/76  Pulse: 98  Resp: 18  Temp: 98.1 F (36.7 C)  TempSrc: Oral  SpO2: 99%   Weight: 185 lb 8 oz (84.1 kg)  Height: 5\' 9"  (1.753 m)    Body mass index is 27.39 kg/m.  Physical Exam  Constitutional: Patient appears well-developed and well-nourished. No distress.  HENT: Head: Normocephalic and atraumatic. Ears: B TMs ok, no erythema or effusion; Nose: Nose normal. Mouth/Throat: Oropharynx is clear and moist. No oropharyngeal exudate.  Eyes: Conjunctivae and EOM are normal. Pupils are equal, round, and reactive to light. No scleral icterus.  Neck: Normal range of motion. Neck supple. No JVD present. No thyromegaly present.  Cardiovascular: Normal rate, regular rhythm and normal heart sounds.  No murmur heard. No BLE edema. Pulmonary/Chest: Effort normal and breath sounds normal. No respiratory distress. Abdominal: Soft. Bowel sounds are normal, no distension. There is no tenderness. no masses MALE GENITALIA & RECTAL:  Deferred.   Musculoskeletal: Normal range of motion, no joint effusions. No gross deformities. +RIGHT phalen's. No bony tenderness, full AROM of the right wrist. Neurological: he is alert and oriented to person, place, and time. No cranial nerve deficit. Coordination, balance, strength, speech and gait are normal.  Skin: Skin is warm and dry. No rash noted. No erythema.  Psychiatric: Patient has a normal mood and affect. behavior is normal. Judgment and thought content normal.  Recent Results (from the past 2160 hour(s))  Cytology (oral, anal, urethral) ancillary only     Status: None   Collection Time: 02/12/18 12:00 AM  Result Value Ref Range   Chlamydia Negative     Comment: Normal Reference Range - Negative   Neisseria gonorrhea Negative     Comment: Normal Reference Range - Negative   Trichomonas Negative     Comment: Normal  Reference Range - Negative  CBC w/Diff/Platelet     Status: Abnormal   Collection Time: 02/12/18  2:22 PM  Result Value Ref Range   WBC 13.0 (H) 3.8 - 10.8 Thousand/uL   RBC 5.33 4.20 - 5.80 Million/uL   Hemoglobin  17.3 (H) 13.2 - 17.1 g/dL   HCT 16.1 09.6 - 04.5 %   MCV 91.0 80.0 - 100.0 fL   MCH 32.5 27.0 - 33.0 pg   MCHC 35.7 32.0 - 36.0 g/dL   RDW 40.9 81.1 - 91.4 %   Platelets 335 140 - 400 Thousand/uL   MPV 10.1 7.5 - 12.5 fL   Neutro Abs 9,464 (H) 1,500 - 7,800 cells/uL   Lymphs Abs 2,314 850 - 3,900 cells/uL   WBC mixed population 741 200 - 950 cells/uL   Eosinophils Absolute 364 15 - 500 cells/uL   Basophils Absolute 117 0 - 200 cells/uL   Neutrophils Relative % 72.8 %   Total Lymphocyte 17.8 %   Monocytes Relative 5.7 %   Eosinophils Relative 2.8 %   Basophils Relative 0.9 %  HIV antibody     Status: None   Collection Time: 02/12/18  2:22 PM  Result Value Ref Range   HIV 1&2 Ab, 4th Generation NON-REACTIVE NON-REACTI    Comment: HIV-1 antigen and HIV-1/HIV-2 antibodies were not detected. There is no laboratory evidence of HIV infection. Marland Kitchen PLEASE NOTE: This information has been disclosed to you from records whose confidentiality may be protected by state law.  If your state requires such protection, then the state law prohibits you from making any further disclosure of the information without the specific written consent of the person to whom it pertains, or as otherwise permitted by law. A general authorization for the release of medical or other information is NOT sufficient for this purpose. . For additional information please refer to http://education.[facility].com/faq/FAQ106 (This link is being provided for informational/ educational purposes only.) . Marland Kitchen The performance of this assay has not been clinically validated in patients less than 80 years old. .   RPR     Status: None   Collection Time: 02/12/18  2:22 PM  Result Value Ref Range   RPR Ser Ql NON-REACTIVE NON-REACTI  COMPLETE METABOLIC PANEL WITH GFR     Status: None   Collection Time: 02/12/18  2:22 PM  Result Value Ref Range   Glucose, Bld 88 65 - 139 mg/dL    Comment: .        Non-fasting  reference interval .    BUN 7 7 - 25 mg/dL   Creat 7.82 9.56 - 2.13 mg/dL   GFR, Est Non African American 83 > OR = 60 mL/min/1.57m2   GFR, Est African American 96 > OR = 60 mL/min/1.54m2   BUN/Creatinine Ratio NOT APPLICABLE 6 - 22 (calc)   Sodium 137 135 - 146 mmol/L   Potassium 4.4 3.5 - 5.3 mmol/L   Chloride 101 98 - 110 mmol/L   CO2 26 20 - 32 mmol/L   Calcium 9.7 8.6 - 10.3 mg/dL   Total Protein 7.1 6.1 - 8.1 g/dL   Albumin 4.6 3.6 - 5.1 g/dL   Globulin 2.5 1.9 - 3.7 g/dL (calc)   AG Ratio 1.8 1.0 - 2.5 (calc)   Total Bilirubin 0.4 0.2 - 1.2 mg/dL   Alkaline phosphatase (APISO) 77 40 - 115 U/L   AST 22 10 - 40 U/L   ALT 28 9 - 46 U/L  Lipid panel  Status: Abnormal   Collection Time: 02/12/18  2:22 PM  Result Value Ref Range   Cholesterol 221 (H) <200 mg/dL   HDL 42 >16 mg/dL   Triglycerides 109 (H) <150 mg/dL    Comment: . If a non-fasting specimen was collected, consider repeat triglyceride testing on a fasting specimen if clinically indicated.  Perry Mount et al. J. of Clin. Lipidol. 2015;9:129-169. Marland Kitchen    LDL Cholesterol (Calc) 140 (H) mg/dL (calc)    Comment: Reference range: <100 . Desirable range <100 mg/dL for primary prevention;   <70 mg/dL for patients with CHD or diabetic patients  with > or = 2 CHD risk factors. Marland Kitchen LDL-C is now calculated using the Martin-Hopkins  calculation, which is a validated novel method providing  better accuracy than the Friedewald equation in the  estimation of LDL-C.  Horald Pollen et al. Lenox Ahr. 6045;409(81): 2061-2068  (http://education.[facility].com/faq/FAQ164)    Total CHOL/HDL Ratio 5.3 (H) <5.0 (calc)   Non-HDL Cholesterol (Calc) 179 (H) <130 mg/dL (calc)    Comment: For patients with diabetes plus 1 major ASCVD risk  factor, treating to a non-HDL-C goal of <100 mg/dL  (LDL-C of <19 mg/dL) is considered a therapeutic  option.      PHQ2/9: Depression screen [facility] 2/9 03/19/2018 02/12/2018  Decreased Interest 0 0   Down, Depressed, Hopeless 0 0  PHQ - 2 Score 0 0  Altered sleeping 0 0  Tired, decreased energy 0 1  Change in appetite 0 0  Feeling bad or failure about yourself  0 0  Trouble concentrating 0 0  Moving slowly or fidgety/restless 0 0  Suicidal thoughts 0 0  PHQ-9 Score 0 -   Fall Risk: Fall Risk  03/19/2018 02/12/2018  Falls in the past year? No No   Assessment & Plan  1. Annual physical exam -Prostate cancer screening and PSA options (with potential risks and benefits of testing vs not testing) were discussed along with recent recs/guidelines. -USPSTF grade A and B recommendations reviewed with patient; age-appropriate recommendations, preventive care, screening tests, etc discussed and encouraged; healthy living encouraged; see AVS for patient education given to patient -Discussed importance of 150 minutes of physical activity weekly, eat two servings of fish weekly, eat one serving of tree nuts ( cashews, pistachios, pecans, almonds.Marland Kitchen) every other day, eat 6 servings of fruit/vegetables daily and drink plenty of water and avoid sweet beverages.   2. Encounter for tobacco use cessation counseling - varenicline (CHANTIX STARTING MONTH PAK) 0.5 MG X 11 & 1 MG X 42 tablet; Take one 0.5 mg tablet by mouth once daily for 3 days, then increase to one 0.5 mg tablet twice daily for 4 days, then increase to one 1 mg tablet twice daily.  Dispense: 53 tablet; Refill: 0  3. Injury of right wrist, initial encounter - meloxicam (MOBIC) 15 MG tablet; Take 0.5-1 tablets (7.5-15 mg total) by mouth daily.  Dispense: 20 tablet; Refill: 0  4. Overweight (BMI 25.0-29.9) - See above for health teaching  5. Mixed hyperlipidemia - atorvastatin (LIPITOR) 20 MG tablet; Take 1 tablet (20 mg total) by mouth daily.  Dispense: 90 tablet; Refill: 1  6. History of ETOH abuse - Hepatitis panel, acute  7. Routine screening for STI (sexually transmitted infection) - Hepatitis panel, acute

## 2018-03-19 NOTE — Patient Instructions (Addendum)
[facility] - for your wrist if not improving in 1 month.  They have a walk-in clinic that is open Monday through Friday 1pm-7pm.   Your LDL is above normal.  The LDL is the "lousy" or bad cholesterol. Over time and in combination with inflammation and other factors, this contributes to plaque which in turn may lead to stroke and/or heart attack down the road.  Sometimes high LDL is primarily genetic, and people might be eating all the right foods but still have high numbers.  Other times, there is room for improvement in one's diet and eating healthier can bring this number down and potentially reduce one's risk of heart attack and/or stroke.  Your LDL level should be below 100. If you have diabetes or a possible heart problem, your LDL should be below 70.  Some strategies to focus on to help improve your LDL levels:  - Eat 20 to 30 grams of fiber every day.  - Eat Foods such as fruits and vegetables, whole grains, beans, peas, nuts, and seeds can help lower LDL. - Avoid Saturated fats - Dairy foods - such as butter, cream, ghee, regular-fat milk and cheese. Meat - such as fatty cuts of beef, pork and lamb, processed meats like salami, sausages and the skin on chicken. Lard., fatty snack foods, cakes, biscuits, pies and deep fried foods) - Avoid smoking  HDL removes extra cholesterol and plaque buildup in your arteries and then sends it to your liver to remove it from your body; this helps reduce your risk of heart disease, heart attack, and stroke.  Foods that increase HDL include beans and legumes, whole grains, high-fiber fruits:prunes, apples, and pears; fatty fish- salmon, tuna, sardines; nuts, olive oil.  Preventive Care 18-39 Years, Male Preventive care refers to lifestyle choices and visits with your health care provider that can promote health and wellness. What does preventive care include?  A yearly physical exam. This is also called an annual well check.  Dental exams once or twice a  year.  Routine eye exams. Ask your health care provider how often you should have your eyes checked.  Personal lifestyle choices, including: ? Daily care of your teeth and gums. ? Regular physical activity. ? Eating a healthy diet. ? Avoiding tobacco and drug use. ? Limiting alcohol use. ? Practicing safe sex. What happens during an annual well check? The services and screenings done by your health care provider during your annual well check will depend on your age, overall health, lifestyle risk factors, and family history of disease. Counseling Your health care provider may ask you questions about your:  Alcohol use.  Tobacco use.  Drug use.  Emotional well-being.  Home and relationship well-being.  Sexual activity.  Eating habits.  Work and work Statistician.  Screening You may have the following tests or measurements:  Height, weight, and BMI.  Blood pressure.  Lipid and cholesterol levels. These may be checked every 5 years starting at age 81.  Diabetes screening. This is done by checking your blood sugar (glucose) after you have not eaten for a while (fasting).  Skin check.  Hepatitis C blood test.  Hepatitis B blood test.  Sexually transmitted disease (STD) testing.  Discuss your test results, treatment options, and if necessary, the need for more tests with your health care provider. Vaccines Your health care provider may recommend certain vaccines, such as:  Influenza vaccine. This is recommended every year.  Tetanus, diphtheria, and acellular pertussis (Tdap, Td) vaccine. You  may need a Td booster every 10 years.  Varicella vaccine. You may need this if you have not been vaccinated.  HPV vaccine. If you are 71 or younger, you may need three doses over 6 months.  Measles, mumps, and rubella (MMR) vaccine. You may need at least one dose of MMR.You may also need a second dose.  Pneumococcal 13-valent conjugate (PCV13) vaccine. You may need this  if you have certain conditions and have not been vaccinated.  Pneumococcal polysaccharide (PPSV23) vaccine. You may need one or two doses if you smoke cigarettes or if you have certain conditions.  Meningococcal vaccine. One dose is recommended if you are age 33-21 years and a first-year college student living in a residence hall, or if you have one of several medical conditions. You may also need additional booster doses.  Hepatitis A vaccine. You may need this if you have certain conditions or if you travel or work in places where you may be exposed to hepatitis A.  Hepatitis B vaccine. You may need this if you have certain conditions or if you travel or work in places where you may be exposed to hepatitis B.  Haemophilus influenzae type b (Hib) vaccine. You may need this if you have certain risk factors.  Talk to your health care provider about which screenings and vaccines you need and how often you need them. This information is not intended to replace advice given to you by your health care provider. Make sure you discuss any questions you have with your health care provider. Document Released: 08/02/2001 Document Revised: 02/24/2016 Document Reviewed: 04/07/2015 Elsevier Interactive Patient Education  [employer].

## 2018-03-19 NOTE — Addendum Note (Signed)
Addended by: Gemini Bunte G on: 03/19/2018 02:20 PM   Modules accepted: Orders

## 2018-03-20 LAB — HEPATITIS PANEL, ACUTE
HEP B C IGM: NONREACTIVE
Hep A IgM: NONREACTIVE
Hepatitis B Surface Ag: NONREACTIVE
Hepatitis C Ab: NONREACTIVE
SIGNAL TO CUT-OFF: 0.03 (ref <1.00)

## 2018-03-30 ENCOUNTER — Encounter: Payer: Self-pay | Admitting: Family Medicine

## 2018-04-22 ENCOUNTER — Emergency Department: Payer: Medicaid Other

## 2018-04-22 ENCOUNTER — Other Ambulatory Visit: Payer: Self-pay

## 2018-04-22 ENCOUNTER — Emergency Department
Admission: EM | Admit: 2018-04-22 | Discharge: 2018-04-22 | Disposition: A | Discharge status: Home / Self Care | Payer: Medicaid Other | Attending: Emergency Medicine | Admitting: Emergency Medicine

## 2018-04-22 DIAGNOSIS — F17210 Nicotine dependence, cigarettes, uncomplicated: Secondary | ICD-10-CM | POA: Diagnosis not present

## 2018-04-22 DIAGNOSIS — R202 Paresthesia of skin: Secondary | ICD-10-CM | POA: Diagnosis present

## 2018-04-22 DIAGNOSIS — Z79899 Other long term (current) drug therapy: Secondary | ICD-10-CM | POA: Insufficient documentation

## 2018-04-22 DIAGNOSIS — I10 Essential (primary) hypertension: Secondary | ICD-10-CM | POA: Diagnosis not present

## 2018-04-22 MED ORDER — HYDROXYZINE HCL 25 MG PO TABS
50.0000 mg | ORAL_TABLET | Freq: Once | ORAL | Status: AC
  Administered 2018-04-22: 50 mg via ORAL
  Filled 2018-04-22: qty 2

## 2018-04-22 NOTE — ED Notes (Signed)
1 attempt made to obtain blood for lab work in Triage. Pt very anxious and stated immediately upon needle insertion, "Take it out, take it out! I feel like I'm about pass out". Pt reassured and instructed on relaxation techniques to calm him down. Pt insists on waiting until roomed for another attempt.

## 2018-04-22 NOTE — ED Notes (Signed)
ED Provider at bedside. 

## 2018-04-22 NOTE — ED Provider Notes (Signed)
[facility] Emergency Department Provider Note  ___________________________________________   First MD Initiated Contact with Patient 04/22/18 [phone]     (approximate)  I have reviewed the triage vital signs and the nursing notes.   HISTORY  Chief Complaint Hypertension   HPI David Phillips is a 32 y.o. male with a history of schizoaffective disorder as well as hypertension was presented to the emergency department today with bilateral arm tingling as well as hypertension that he says will come at approximately 3 AM this morning.  He says that he then took a 10 mg lisinopril at approximately 3:30 AM.  Says that the symptoms have been improving since then but he was concerned about a heart attack.  Says that he has a history of heart disease in his family with his father having bypass and started to have heart disease in his 30s.  Patient says that he also smokes approximately 2 packs/day and drinks regularly.  Last drink was yesterday in the afternoon.  Denies any drug use.  Denies any chest pain or shortness of breath.  Says that he also had several scoops of tuna which he believes to have blood pressure lowering properties.  Patient also had an attempted blood draw in triage which was unsuccessful.  He is now refusing further blood draws because he says that his right arm veins are "fucked up."  Refusing blood draw on his left arm because he says that that arm is too sensitive.  He understands that we are unable to do a full work-up without blood work.  Past Medical History:  Diagnosis Date  . Gastric ulcer   . Schizoaffective disorder [facility])     Patient Active Problem List   Diagnosis Date Noted  . Hyperlipidemia 03/19/2018  . Chronic left hip pain 02/12/2018  . Chronic left-sided low back pain without sciatica 02/12/2018  . Neutrophilia 02/12/2018  . Schizoaffective disorder, bipolar type (HCC) 02/12/2018  . Encounter for tobacco use cessation counseling  02/12/2018  . Overweight (BMI 25.0-29.9) 02/12/2018  . Allergic rhinitis 03/23/2017  . Hypertension 02/23/2017    History reviewed. No pertinent surgical history.  Prior to Admission medications   Medication Sig Start Date End Date Taking? Authorizing Provider  atorvastatin (LIPITOR) 20 MG tablet Take 1 tablet (20 mg total) by mouth daily. 03/19/18   Doren Custard, FNP  betamethasone valerate (VALISONE) 0.1 % cream APP AA ON HANDS D DURING DIFFICULT FLARES 03/01/18   [provider]  ELIDEL 1 % cream APPLY ON THE AFFECTED SKIN D 02/28/18   [provider]  hydrOXYzine (ATARAX/VISTARIL) 50 MG tablet Take 1 tablet (50 mg total) by mouth 3 (three) times daily as needed. 12/02/17   Joni Reining, PA-C  lisinopril (PRINIVIL,ZESTRIL) 10 MG tablet Take 1 tablet (10 mg total) by mouth daily. 02/12/18   Doren Custard, FNP  Lurasidone HCl (LATUDA) 120 MG TABS Take 1 tablet by mouth daily.    [provider]  meloxicam (MOBIC) 15 MG tablet Take 0.5-1 tablets (7.5-15 mg total) by mouth daily. 03/19/18   Doren Custard, FNP  varenicline (CHANTIX STARTING MONTH PAK) 0.5 MG X 11 & 1 MG X 42 tablet Take one 0.5 mg tablet by mouth once daily for 3 days, then increase to one 0.5 mg tablet twice daily for 4 days, then increase to one 1 mg tablet twice daily. 03/19/18   Doren Custard, FNP    Allergies Prednisone  Family History  Problem Relation Age  of Onset  . Diabetes Mother   . Diabetes Father   . Heart attack Father 71       Bypass surgeries; 3 MI's  . Schizophrenia Other   . Heart attack Paternal Grandfather 1  . Other Neg Hx     Social History Social History   Tobacco Use  . Smoking status: Current Every Day Smoker    Packs/day: 1.00    Years: 20.00    Pack years: 20.00    Types: Cigarettes    Start date: 43  . Smokeless tobacco: Former Engineer, water Use Topics  . Alcohol use: Yes    Alcohol/week: 6.0 standard drinks    Types: 6 Cans of beer per week    . Drug use: Yes    Comment: h/o drug use in the past.    Review of Systems  Constitutional: No fever/chills Eyes: No visual changes. ENT: No sore throat. Cardiovascular: Denies chest pain. Respiratory: Denies shortness of breath. Gastrointestinal: No abdominal pain.  No nausea, no vomiting.  No diarrhea.  No constipation. Genitourinary: Negative for dysuria. Musculoskeletal: Negative for back pain. Skin: Negative for rash. Neurological: Negative for headaches, focal weakness   ____________________________________________   PHYSICAL EXAM:  VITAL SIGNS: ED Triage Vitals  Enc Vitals Group     BP 04/22/18 0632 (!) 188/118     Pulse Rate 04/22/18 0632 (!) 106     Resp 04/22/18 0632 18     Temp 04/22/18 0632 98 F (36.7 C)     Temp Source 04/22/18 0632 Oral     SpO2 04/22/18 0632 100 %     Weight 04/22/18 0633 180 lb (81.6 kg)     Height 04/22/18 0633 5\' 9"  (1.753 m)     Head Circumference --      Peak Flow --      Pain Score 04/22/18 0633 0     Pain Loc --      Pain Edu? --      Excl. in GC? --     Constitutional: Alert and oriented.  Patient without any distress but speaking very quickly. Eyes: Conjunctivae are normal.  Head: Atraumatic. Nose: No congestion/rhinnorhea. Mouth/Throat: Mucous membranes are moist.  Neck: No stridor.   Cardiovascular: Normal rate, regular rhythm.  Heart rate of 89 bpm in the room.  Grossly normal heart sounds.  Good peripheral circulation with equal bilateral radial pulses. Respiratory: Normal respiratory effort.  No retractions. Lungs CTAB. Gastrointestinal: Soft and nontender. No distention.  Musculoskeletal: No lower extremity tenderness nor edema.  No joint effusions. Neurologic:  Normal speech and language. No gross focal neurologic deficits are appreciated. Skin:  Skin is warm, dry and intact. No rash noted. Psychiatric: Mood and affect are normal.   ____________________________________________   LABS (all labs ordered are  listed, but only abnormal results are displayed)  Labs Reviewed  BASIC METABOLIC PANEL  CBC  TROPONIN I   ____________________________________________  EKG  ED ECG REPORT I, Arelia Longest, the attending physician, personally viewed and interpreted this ECG.   Date: 04/22/2018  EKG Time: 1837  Rate: 92  Rhythm: normal sinus rhythm  Axis: Right axis  Intervals:none  ST&T Change: Biphasic T waves in aVF.  No ST segment elevation or depression.  ____________________________________________  RADIOLOGY  No acute finding on the chest x-ray ____________________________________________   PROCEDURES  Procedure(s) performed:   Procedures  Critical Care performed:   ____________________________________________   INITIAL IMPRESSION / ASSESSMENT AND PLAN / ED COURSE  Pertinent  labs & imaging results that were available during my care of the patient were reviewed by me and considered in my medical decision making (see chart for details).  Differential diagnosis includes, but is not limited to, ACS, aortic dissection, pulmonary embolism, cardiac tamponade, pneumothorax, pneumonia, pericarditis, myocarditis, GI-related causes including esophagitis/gastritis, and musculoskeletal chest wall pain, paresthesia, less light abnormality, CVA As part of my medical decision making, I reviewed the following data within the electronic MEDICAL RECORD NUMBER Notes from prior ED visits  Patient states pedal he that he would not like to have blood work drawn.  He says that he would be amenable to having this done approximately 1 week once his arm has "healed up."  He would like to follow-up with his primary care doctor regarding this.  He understands we cannot fully evaluate him for heart attack/ACS without further testing.  He is understanding of the ramifications of this including death, permanent disability and further symptoms.  He is clinically sober at this time.     ----------------------------------------- 9:02 AM on 04/22/2018 -----------------------------------------  Patient's blood pressure fluctuated into the 140s 150s systolic in the 90s to 100s diastolic.  Patient not complaining of any numbness or tingling at this time.  Still refusing blood work.  Symptoms are improving.  Recommend that he continue taking his lisinopril.  Also given dose of hydroxyzine and he appears much more calm at this time.  Patient to be discharged at this time.  He knows that he may return at any time for further testing.  Otherwise, will be given follow-up information for cardiology and plans to follow-up with his primary care doctor.  He is understanding of the diagnosis as well as treatment and willing to comply.  He also understands that he has received an incomplete evaluation and we cannot fully evaluate him for cardiac disease given his refusal of blood work. ____________________________________________   FINAL CLINICAL IMPRESSION(S) / ED DIAGNOSES  Paresthesia.  Hypertension.  NEW MEDICATIONS STARTED DURING THIS VISIT:  New Prescriptions   No medications on file     Note:  This document was prepared using Dragon voice recognition software and may include unintentional dictation errors.     Myrna Blazer, MD 04/22/18 [phone]

## 2018-04-22 NOTE — ED Triage Notes (Signed)
Pt arrives to ED via POV from home with c/o "numbness and tingling" in both arms. Pt states he's concerned about HTN, took a dose of 10mg  Lisinopril PTA. Pt denies CP or SHOB. Pt states he doesn't take Lisinopril regularly d/t "erectile dysfunction". Pt is A&O, in NAD; appears anxious, but RR is even, regular, and unlabored.

## 2018-04-22 NOTE — ED Notes (Signed)
Pt refusing blood work 

## 2018-04-23 ENCOUNTER — Encounter (HOSPITAL_COMMUNITY): Payer: Self-pay | Admitting: Emergency Medicine

## 2018-04-23 ENCOUNTER — Encounter: Payer: Self-pay | Admitting: Family Medicine

## 2018-04-23 ENCOUNTER — Ambulatory Visit: Payer: Self-pay | Admitting: *Deleted

## 2018-04-23 ENCOUNTER — Emergency Department (HOSPITAL_COMMUNITY): Payer: Medicaid Other

## 2018-04-23 ENCOUNTER — Other Ambulatory Visit: Payer: Self-pay

## 2018-04-23 ENCOUNTER — Emergency Department (HOSPITAL_COMMUNITY)
Admission: EM | Admit: 2018-04-23 | Discharge: 2018-04-23 | Disposition: A | Discharge status: Home / Self Care | Payer: Medicaid Other | Attending: Emergency Medicine | Admitting: Emergency Medicine

## 2018-04-23 DIAGNOSIS — F17210 Nicotine dependence, cigarettes, uncomplicated: Secondary | ICD-10-CM | POA: Insufficient documentation

## 2018-04-23 DIAGNOSIS — Z79899 Other long term (current) drug therapy: Secondary | ICD-10-CM | POA: Diagnosis not present

## 2018-04-23 DIAGNOSIS — R079 Chest pain, unspecified: Secondary | ICD-10-CM | POA: Insufficient documentation

## 2018-04-23 DIAGNOSIS — I10 Essential (primary) hypertension: Secondary | ICD-10-CM | POA: Diagnosis not present

## 2018-04-23 LAB — CBC
HCT: 49.7 % (ref 39.0–52.0)
Hemoglobin: 16.8 g/dL (ref 13.0–17.0)
MCH: 32.4 pg (ref 26.0–34.0)
MCHC: 33.8 g/dL (ref 30.0–36.0)
MCV: 95.9 fL (ref 80.0–100.0)
PLATELETS: 285 10*3/uL (ref 150–400)
RBC: 5.18 MIL/uL (ref 4.22–5.81)
RDW: 13.8 % (ref 11.5–15.5)
WBC: 11 10*3/uL — AB (ref 4.0–10.5)
nRBC: 0 % (ref 0.0–0.2)

## 2018-04-23 LAB — BASIC METABOLIC PANEL
Anion gap: 8 (ref 5–15)
BUN: 9 mg/dL (ref 6–20)
CALCIUM: 8.7 mg/dL — AB (ref 8.9–10.3)
CO2: 27 mmol/L (ref 22–32)
Chloride: 101 mmol/L (ref 98–111)
Creatinine, Ser: 1.27 mg/dL — ABNORMAL HIGH (ref 0.61–1.24)
GFR calc Af Amer: >60 mL/min (ref >60)
GLUCOSE: 105 mg/dL — AB (ref 70–99)
Potassium: 4.4 mmol/L (ref 3.5–5.1)
SODIUM: 136 mmol/L (ref 135–145)

## 2018-04-23 LAB — I-STAT TROPONIN, ED: TROPONIN I, POC: 0.01 ng/mL (ref 0.00–0.08)

## 2018-04-23 NOTE — Telephone Encounter (Signed)
This patient is checked in at [facility] ED at this time. Completing out this triage call. No triage done.

## 2018-04-23 NOTE — ED Notes (Signed)
Pt up to desk asking for an update. Pt informed that we do not have any lab work because he refused earlier in triage. Pt agreed to have lab work drawn at this time.

## 2018-04-23 NOTE — ED Notes (Signed)
Pt ambulated to room with steady gait.

## 2018-04-23 NOTE — ED Notes (Signed)
ED Provider at bedside. 

## 2018-04-23 NOTE — ED Triage Notes (Signed)
Patient to ED c/o non-radiating epigastric pain onset today - reports he is under increased stress and takes medication for anxiety. Does appear anxious and states he is supposed to see a cardiologist tomorrow for an abnormal EKG. Currently, patient is in no distress, resp e/u, skin warm/dry. Seen at [facility] yesterday for similar, but no labs were done. Hx HTN and hyperlipidemia.

## 2018-04-23 NOTE — ED Provider Notes (Signed)
Patient placed in Quick Look pathway, seen and evaluated   Chief Complaint: chest pain  HPI: David Phillips is a 32 y.o. with a history of schizoaffective disorder as well as hypertension who presented to the emergency department today with mid sternal chest pain that started yesterday. Patient reports going to [facility] and having an EKG done and being scheduled for f/u with cardiologist for tomorrow. Patient here today reporting he wants to be worked up here due to worsening pain in the mid upper chest. Patient smokes 2 ppd and drinks alcohol daily.   Patient refused blood work yesterday at [facility].  ROS: CV: chest pain  GI: nausea  Physical Exam:  BP (!) 142/92 (BP Location: Right Arm)   Pulse 89   Temp 98 F (36.7 C) (Oral)   Resp 18   SpO2 99%    Gen: No distress  Neuro: Awake and Alert  Skin: Warm and dry  Chest: tender with palpation epigastric area   Initiation of care has begun. The patient has been counseled on the process, plan, and necessity for staying for the completion/evaluation, and the remainder of the medical screening examination    Janne Napoleon, NP 04/23/18 1705    Loren Racer, MD 04/25/18 [phone]

## 2018-04-23 NOTE — ED Provider Notes (Signed)
[facility] EMERGENCY DEPARTMENT Provider Note   CSN: [id number] Arrival date & time: 04/23/18  1639     History   Chief Complaint Chief Complaint  Patient presents with  . Chest Pain    HPI David Phillips is a 32 y.o. male.  The history is provided by the patient and medical records.  Chest Pain      32 y.o. M with hx of schizoaffective disorder, gastric ulcers, HTN, presenting to the ED for chest pain that began yesterday.  Patient seen at [facility] yesterday, however some apparent difficulties with blood draw so no labs were done.  He is scheduled to see cardiologist tomorrow.  States he came in today due to ongoing pain and fear of heart attack.  Pain is mid-sternal in nature, no radiation.  No associated SOB.  States he did have some tingling in his arms yesterday, however this was associated with lightheadedness and occurred while drawing his blood.  No recurrence of this since.  No numbness/weakness of arms of legs.  No dizziness, diaphoresis, nausea, vomiting, SOB, cough, fever, chills, or hemoptysis.  He does reports heavy smoking-- approx 2 PPD since he was a teenager.  Also reports moderate alcohol use.  Denies IVDU.  No hx of DVT or PE.  No recent travel, calf pain, surgery, or prolonged immobilization.  Patient also reports he stopped taking his BP meds 2 days ago due to lower sex drive which he read online can be a side effect of his medication (lisinopril).  Past Medical History:  Diagnosis Date  . Gastric ulcer   . Schizoaffective disorder [facility])     Patient Active Problem List   Diagnosis Date Noted  . Hyperlipidemia 03/19/2018  . Chronic left hip pain 02/12/2018  . Chronic left-sided low back pain without sciatica 02/12/2018  . Neutrophilia 02/12/2018  . Schizoaffective disorder, bipolar type (HCC) 02/12/2018  . Encounter for tobacco use cessation counseling 02/12/2018  . Overweight (BMI 25.0-29.9) 02/12/2018  . Allergic rhinitis 03/23/2017  .  Hypertension 02/23/2017    History reviewed. No pertinent surgical history.      Home Medications    Prior to Admission medications   Medication Sig Start Date End Date Taking? Authorizing Provider  atorvastatin (LIPITOR) 20 MG tablet Take 1 tablet (20 mg total) by mouth daily. 03/19/18   Doren Custard, FNP  betamethasone valerate (VALISONE) 0.1 % cream APP AA ON HANDS D DURING DIFFICULT FLARES 03/01/18   [provider]  ELIDEL 1 % cream APPLY ON THE AFFECTED SKIN D 02/28/18   [provider]  hydrOXYzine (ATARAX/VISTARIL) 50 MG tablet Take 1 tablet (50 mg total) by mouth 3 (three) times daily as needed. 12/02/17   Joni Reining, PA-C  lisinopril (PRINIVIL,ZESTRIL) 10 MG tablet Take 1 tablet (10 mg total) by mouth daily. 02/12/18   Doren Custard, FNP  Lurasidone HCl (LATUDA) 120 MG TABS Take 1 tablet by mouth daily.    [provider]  meloxicam (MOBIC) 15 MG tablet Take 0.5-1 tablets (7.5-15 mg total) by mouth daily. 03/19/18   Doren Custard, FNP  varenicline (CHANTIX STARTING MONTH PAK) 0.5 MG X 11 & 1 MG X 42 tablet Take one 0.5 mg tablet by mouth once daily for 3 days, then increase to one 0.5 mg tablet twice daily for 4 days, then increase to one 1 mg tablet twice daily. 03/19/18   Doren Custard, FNP    Family History Family History  Problem Relation Age  of Onset  . Diabetes Mother   . Diabetes Father   . Heart attack Father 23       Bypass surgeries; 3 MI's  . Schizophrenia Other   . Heart attack Paternal Grandfather 73  . Other Neg Hx     Social History Social History   Tobacco Use  . Smoking status: Current Every Day Smoker    Packs/day: 1.00    Years: 20.00    Pack years: 20.00    Types: Cigarettes    Start date: 65  . Smokeless tobacco: Former Engineer, water Use Topics  . Alcohol use: Yes    Alcohol/week: 6.0 standard drinks    Types: 6 Cans of beer per week  . Drug use: Yes    Comment: h/o drug use in the past.      Allergies   Prednisone   Review of Systems Review of Systems  Cardiovascular: Positive for chest pain.  All other systems reviewed and are negative.    Physical Exam Updated Vital Signs BP 137/86   Pulse 77   Temp 98 F (36.7 C) (Oral)   Resp (!) 21   Ht 5\' 9"  (1.753 m)   Wt 81.6 kg   SpO2 98%   BMI 26.58 kg/m   Physical Exam  Constitutional: He is oriented to person, place, and time. He appears well-developed and well-nourished.  Smells heavily of cigarette smoke  HENT:  Head: Normocephalic and atraumatic.  Mouth/Throat: Oropharynx is clear and moist.  Eyes: Pupils are equal, round, and reactive to light. Conjunctivae and EOM are normal.  Neck: Normal range of motion.  Cardiovascular: Normal rate, regular rhythm and normal heart sounds.  Pulmonary/Chest: Effort normal and breath sounds normal. He has no wheezes. He has no rhonchi.  Chest wall tenderness along lower mid-sternal region; no deformity; no signs of infection  Abdominal: Soft. Bowel sounds are normal.  Musculoskeletal: Normal range of motion.  Puncture site to right AC from attempted blood draw yesterday, no bruising or signs of infection  Neurological: He is alert and oriented to person, place, and time.  Skin: Skin is warm and dry.  Psychiatric:  Odd affect, ruminating thoughts about ED visit yesterday and difficulties with blood draw throughout exam  Nursing note and vitals reviewed.    ED Treatments / Results  Labs (all labs ordered are listed, but only abnormal results are displayed) Labs Reviewed  BASIC METABOLIC PANEL - Abnormal; Notable for the following components:      Result Value   Glucose, Bld 105 (*)    Creatinine, Ser 1.27 (*)    Calcium 8.7 (*)    All other components within normal limits  CBC - Abnormal; Notable for the following components:   WBC 11.0 (*)    All other components within normal limits  I-STAT TROPONIN, ED    EKG None  Radiology Dg Chest 2  View  Result Date: 04/23/2018 CLINICAL DATA:  Epigastric pain EXAM: CHEST - 2 VIEW COMPARISON:  04/22/2018, 04/05/2013 FINDINGS: The heart size and mediastinal contours are within normal limits. Both lungs are clear. The visualized skeletal structures are unremarkable. IMPRESSION: No active cardiopulmonary disease. Electronically Signed   By: Jasmine Pang M.D.   On: 04/23/2018 19:54    Procedures Procedures (including critical care time)  Medications Ordered in ED Medications - No data to display   Initial Impression / Assessment and Plan / ED Course  I have reviewed the triage vital signs and the nursing notes.  Pertinent labs & imaging results that were available during my care of the patient were reviewed by me and considered in my medical decision making (see chart for details).  32 y.o. M here with chest pain.  Seen at [facility] yesterday for same, did not have labs done due to difficulty drawing blood.  Reports continued symptoms and returned here today.  EKG with some non-specific t-wave changes, no acute ischemic changes noted.  Labs reassuring-- trop negative.  He is PERC negative without any significant risk factors for DVT or PE and I have low suspicion for such.  Feel he is stable for discharge home.  He has scheduled follow-up with cardiology tomorrow.  I did discuss with him that he should try to quit smoking as this is preventable risk factor for heart disease.  He will return here for any new/acute changes.  Of note, patient stopped his BP meds 2 days ago due to adverse side effects.  Pressures remain controlled here without any signs of end organ damage.  Will discuss this with his PCP.  Final Clinical Impressions(s) / ED Diagnoses   Final diagnoses:  Chest pain in adult    ED Discharge Orders    None       Garlon Hatchet, PA-C 04/24/18 0149    Wynetta Fines, MD 04/27/18 1504

## 2018-04-23 NOTE — ED Notes (Signed)
Patient refused blood work in triage.

## 2018-04-23 NOTE — Progress Notes (Deleted)
No show

## 2018-04-23 NOTE — Discharge Instructions (Signed)
Follow-up with cardiology tomorrow as scheduled. If you prefer to see cardiologist here in [place], I have listed the contact information for you. Try to cut back on your smoking like we discussed. Return here for any new/acute changes.

## 2018-04-24 ENCOUNTER — Ambulatory Visit: Payer: Medicaid Other | Admitting: Cardiovascular Disease

## 2018-04-25 ENCOUNTER — Encounter: Payer: Self-pay | Admitting: Cardiovascular Disease

## 2018-05-29 DIAGNOSIS — R079 Chest pain, unspecified: Secondary | ICD-10-CM | POA: Insufficient documentation

## 2018-05-29 NOTE — Progress Notes (Signed)
Cardiology Office Note  Date:  05/30/2018   ID:  David Phillips, DOB 01/23/1986, MRN [id number]  PCP:  Doren Custard, FNP   Chief Complaint  Patient presents with  . New Patient (Initial Visit)    ED follow up non-specific CP. Medicaions reviewed verbally.    HPI:  Mr. Carmon Sahli is a 32 y.o. M with past medical history of schizoaffective disorder,  gastric ulcers,  HTN, Smoker 2 PPD, Presenting for new patient evaluation for chest discomfort   Recent presentation to the ED for chest pain  Symptoms over the past several weeks Not very active at baseline, Reports he is on disability SSI and does not do any exercise, no working Does not do much of anything When asked of his most recent strenuous activity he denies doing anything  Concerned about lisinopril Side effects, possible erectile dysfunction He would like a different blood pressure pill  Started on Lipitor but has not started  Reports having family history of cardiac disease Some tingling in his hands when his blood pressure runs high  Review of previous blood pressures show most the time it is relatively well controlled He does report having periodic panic attacks  Chest pain is right of his sternum, localized lower ribs tender to palpation Denies having any chest pain with exertion  EKG personally reviewed by myself on todays visit Shows normal sinus rhythm rate 83 bpm no significant ST or T wave changes   PMH:   has a past medical history of Gastric ulcer and Schizoaffective disorder (HCC).  PSH:   History reviewed. No pertinent surgical history.  Current Outpatient Medications  Medication Sig Dispense Refill  . atorvastatin (LIPITOR) 20 MG tablet Take 1 tablet (20 mg total) by mouth daily. 90 tablet 1  . betamethasone valerate (VALISONE) 0.1 % cream APP AA ON HANDS D DURING DIFFICULT FLARES  3  . ELIDEL 1 % cream APPLY ON THE AFFECTED SKIN D  3  . hydrOXYzine (ATARAX/VISTARIL) 50 MG tablet Take 1  tablet (50 mg total) by mouth 3 (three) times daily as needed. 30 tablet 0  . Lurasidone HCl (LATUDA) 120 MG TABS Take 1 tablet by mouth daily.    . meloxicam (MOBIC) 15 MG tablet Take 0.5-1 tablets (7.5-15 mg total) by mouth daily. 20 tablet 0  . varenicline (CHANTIX STARTING MONTH PAK) 0.5 MG X 11 & 1 MG X 42 tablet Take one 0.5 mg tablet by mouth once daily for 3 days, then increase to one 0.5 mg tablet twice daily for 4 days, then increase to one 1 mg tablet twice daily. 53 tablet 0  . losartan (COZAAR) 25 MG tablet Take 1 tablet (25 mg total) by mouth daily. 90 tablet 3   No current facility-administered medications for this visit.      Allergies:   Prednisone   Social History:  The patient  reports that he has been smoking cigarettes. He started smoking about 21 years ago. He has a 20.00 pack-year smoking history. He has quit using smokeless tobacco. He reports that he drinks about 6.0 standard drinks of alcohol per week. He reports that he has current or past drug history.   Family History:   family history includes Diabetes in his father and mother; Heart attack (age of onset: 38) in his father and paternal grandfather; Schizophrenia in his other.    Review of Systems: Review of Systems  Constitutional: Negative.   Respiratory: Negative.   Cardiovascular: Negative.  Chest wall pain on the right of sternum  Gastrointestinal: Negative.   Musculoskeletal: Negative.   Neurological: Negative.   Psychiatric/Behavioral: Negative.   All other systems reviewed and are negative.    PHYSICAL EXAM: VS:  BP (!) 152/78 (BP Location: Right Arm, Patient Position: Sitting, Cuff Size: Normal)   Pulse 83   Ht 5\' 9"  (1.753 m)   Wt 182 lb (82.6 kg)   BMI 26.88 kg/m  , BMI Body mass index is 26.88 kg/m. GEN: Well nourished, well developed, in no acute distress  HEENT: normal  Neck: no JVD, carotid bruits, or masses Cardiac: RRR; no murmurs, rubs, or gallops,no edema  Respiratory:   clear to auscultation bilaterally, normal work of breathing GI: soft, nontender, nondistended, + BS MS: no deformity or atrophy  Skin: warm and dry, no rash Neuro:  Strength and sensation are intact Psych: euthymic mood, full affect   Recent Labs: 02/12/2018: ALT 28 04/23/2018: BUN 9; Creatinine, Ser 1.27; Hemoglobin 16.8; Platelets 285; Potassium 4.4; Sodium 136    Lipid Panel Lab Results  Component Value Date   CHOL 221 (H) 02/12/2018   HDL 42 02/12/2018   LDLCALC 140 (H) 02/12/2018   TRIG 242 (H) 02/12/2018      Wt Readings from Last 3 Encounters:  05/30/18 182 lb (82.6 kg)  04/23/18 180 lb (81.6 kg)  04/22/18 180 lb (81.6 kg)      ASSESSMENT AND PLAN:  Essential hypertension - Plan: EKG 12-Lead He would like an alternate medication and would like to stop lisinopril He read about side effects and concerned erectile dysfunction Recommend he try losartan 25 mg daily Suspect some of his high blood pressures are secondary to stress or anxiety He does not own a blood pressure cuff but will check at local pharmacy  Chest pain, unspecified type - Plan: EKG 12-Lead Reproducible chest pain with palpation right of his sternum Previously seen by chiropractic Recommend he follow-up with chiropractor.  He is requesting a referral  Schizoaffective disorder, bipolar type (HCC) Managed by primary care and psychiatry   mixed hyperlipidemia Suggested he could start his Lipitor 20 mg daily  Disposition:   F/U as needed   Total encounter time more than 60 minutes  Greater than 50% was spent in counseling and coordination of care with the patient    Orders Placed This Encounter  Procedures  . EKG 12-Lead     Signed, Dossie Arbourim Esbeydi Manago, M.D., Ph.D. 05/30/2018  [facility], [place] [phone]

## 2018-05-30 ENCOUNTER — Ambulatory Visit (INDEPENDENT_AMBULATORY_CARE_PROVIDER_SITE_OTHER): Payer: Medicaid Other | Admitting: Cardiovascular Disease

## 2018-05-30 ENCOUNTER — Encounter: Payer: Self-pay | Admitting: Cardiovascular Disease

## 2018-05-30 VITALS — BP 152/78 | HR 83 | Ht 69.0 in | Wt 182.0 lb

## 2018-05-30 DIAGNOSIS — R079 Chest pain, unspecified: Secondary | ICD-10-CM | POA: Diagnosis not present

## 2018-05-30 DIAGNOSIS — E782 Mixed hyperlipidemia: Secondary | ICD-10-CM

## 2018-05-30 DIAGNOSIS — I10 Essential (primary) hypertension: Secondary | ICD-10-CM | POA: Diagnosis not present

## 2018-05-30 DIAGNOSIS — F250 Schizoaffective disorder, bipolar type: Secondary | ICD-10-CM | POA: Diagnosis not present

## 2018-05-30 MED ORDER — LOSARTAN POTASSIUM 25 MG PO TABS: 25.0000 mg | ORAL_TABLET | Freq: Every day | ORAL | 3 refills | Status: DC

## 2018-05-30 NOTE — Progress Notes (Signed)
Called [facility] and they do not require referral for patient to be seen. They advised patient to just call and set up appointment with them.   Spoke with patient and advised that they do not require referral and that he could just call to arrange appointment at their office.

## 2018-05-30 NOTE — Patient Instructions (Addendum)
We will make a referral to [facility] for right side chest pain, back out of alignment   Medication Instructions:   Please hold the lisinopril Start losartan 25 mg once a day  If you need a refill on your cardiac medications before your next appointment, please call your pharmacy.    Lab work: No new labs needed   If you have labs (blood work) drawn today and your tests are completely normal, you will receive your results only by: Marland Kitchen. MyChart Message (if you have MyChart) OR . A paper copy in the mail If you have any lab test that is abnormal or we need to change your treatment, we will call you to review the results.   Testing/Procedures: No new testing needed   Follow-Up: At [facility], you and your health needs are our priority.  As part of our continuing mission to provide you with exceptional heart care, we have created designated Provider Care Teams.  These Care Teams include your primary Cardiologist (physician) and Advanced Practice Providers (APPs -  Physician Assistants and Nurse Practitioners) who all work together to provide you with the care you need, when you need it.  . You will need a follow up appointment as needed   . Providers on your designated Care Team:   . Nicolasa Duckinghristopher Berge, NP . Eula Listenyan Dunn, PA-C . Marisue IvanJacquelyn Visser, PA-C  Any Other Special Instructions Will Be Listed Below (If Applicable).  For educational health videos Log in to : [email or web address] Or : [email or web address], password : [email or web address]

## 2018-05-31 ENCOUNTER — Telehealth: Payer: Self-pay | Admitting: Cardiovascular Disease

## 2018-05-31 DIAGNOSIS — M549 Dorsalgia, unspecified: Secondary | ICD-10-CM

## 2018-05-31 NOTE — Telephone Encounter (Signed)
Patient calling  States [facility] does not accept medicaid but [facility] does  Fax: [phone]  Phone: [phone]

## 2018-06-01 NOTE — Telephone Encounter (Signed)
Pt is returning your call regarding Dr. Trilby [facility]'s office referral.

## 2018-06-01 NOTE — Telephone Encounter (Signed)
I attempted to call the patient.  I left a message for him to call back if he still to speak with us.

## 2018-06-01 NOTE — Telephone Encounter (Signed)
Referral entered and [facility] to [facility] at [phone]. Confirmation received.  I left a message on the patient's voice mail, OK per DPR, that this has been done. I asked that he call Dr. Trilby DrummerBeshel's office middle of next week if he has not heard anything from them regarding an appointment.

## 2018-06-04 NOTE — Telephone Encounter (Signed)
I spoke with the patient. He did not get my voice mail that I had faxed his referral to Dr. Trilby [facility]merBeshel's office.  I advised him I faxed this a little after noon on Friday and I think they closed at noon. I asked that he call them to follow up on the referral.  The patient voiced understanding.

## 2018-06-21 ENCOUNTER — Other Ambulatory Visit: Payer: Self-pay

## 2018-06-21 DIAGNOSIS — D729 Disorder of white blood cells, unspecified: Secondary | ICD-10-CM

## 2018-06-25 ENCOUNTER — Inpatient Hospital Stay: Payer: Medicaid Other | Admitting: Internal Medicine

## 2018-06-25 ENCOUNTER — Inpatient Hospital Stay: Payer: Medicaid Other

## 2018-06-25 NOTE — Assessment & Plan Note (Signed)
#   MILD leukocytosis/neutrophilia-stable since 2015; rest of the CBC within normal limits.  Will hold off any work-up at this time.  We will review the records from [facility] [3 to 4 years prior].  The fact that his neutrophilia is fairly stable for the last 4 to 5 years-this is reassuring.  This is likely to secondary causes like smoking.   # smoking-encouraged patient to quit smoking; discussed at length regarding adverse medical complications of smoking.  # follow up 4 months/cbc. Records at next visit.   Thank you Ms.Maurice Small NP for allowing me to participate in the care of your pleasant patient. Please do not hesitate to contact me with questions or concerns in the interim.

## 2018-06-25 NOTE — Progress Notes (Unsigned)
[place] NOTE  Patient Care Team: Hubbard Hartshorn, FNP as PCP - General (Family Medicine)  CHIEF COMPLAINTS/PURPOSE OF CONSULTATION: LEUCOCYTOSIS  # LEUCOCYTOSIS/ neutrophilia [since 2015; 13-14 s/p "work up for leukemia at [facility] 2014/2015"]  # Hx of smoking/ drug abuse; Schizoaffetctive disorder [Dr.M]  No history exists.     HISTORY OF PRESENTING ILLNESS:  David Phillips 33 y.o.  male with a history of psychiatric illness; active smoker has been referred to [place] for further evaluation recommendations for ongoing leukocytosis.  Patient denies any unusual weight loss.  Denies any drainage or night sweats.  He has intermittent cough and positive for nausea.  Patient states that he was tested for leukemia 3 to 4 years ago in [place] to be negative.  He states his counts are gone up to 24 at the time.  He did not have a bone marrow biopsy.  Review of Systems  Constitutional: Negative for chills, diaphoresis, fever, malaise/fatigue and weight loss.  HENT: Negative for nosebleeds and sore throat.   Eyes: Negative for double vision.  Respiratory: Positive for cough. Negative for hemoptysis, sputum production, shortness of breath and wheezing.   Cardiovascular: Negative for chest pain, palpitations, orthopnea and leg swelling.  Gastrointestinal: Positive for heartburn. Negative for abdominal pain, blood in stool, constipation, diarrhea, melena, nausea and vomiting.  Genitourinary: Negative for dysuria, frequency and urgency.  Musculoskeletal: Negative for back pain and joint pain.  Skin: Negative.  Negative for itching and rash.  Neurological: Negative for dizziness, tingling, focal weakness, weakness and headaches.  Endo/Heme/Allergies: Does not bruise/bleed easily.  Psychiatric/Behavioral: Negative for depression. The patient is nervous/anxious. The patient does not have insomnia.      MEDICAL HISTORY:  Past Medical History:  Diagnosis Date  . Gastric ulcer    . Schizoaffective disorder ([place])     SURGICAL HISTORY: No past surgical history on file.  SOCIAL HISTORY: active smoker/ alcohol/beer "too much"; on disability sec to psychiatric illness. Social History   Socioeconomic History  . Marital status: Single    Spouse name: Not on file  . Number of children: 0  . Years of education: Not on file  . Highest education level: Not on file  Occupational History  . Occupation: looking for a Lobbyist job  . Occupation: disability  Social Needs  . Financial resource strain: Somewhat hard  . Food insecurity:    Worry: Sometimes true    Inability: Sometimes true  . Transportation needs:    Medical: Yes    Non-medical: Yes  Tobacco Use  . Smoking status: Current Every Day Smoker    Packs/day: 1.00    Years: 20.00    Pack years: 20.00    Types: Cigarettes    Start date: 93  . Smokeless tobacco: Former Network engineer and Sexual Activity  . Alcohol use: Yes    Alcohol/week: 6.0 standard drinks    Types: 6 Cans of beer per week  . Drug use: Yes    Comment: h/o drug use in the past.  . Sexual activity: Not Currently  Lifestyle  . Physical activity:    Days per week: 0 days    Minutes per session: 0 min  . Stress: Rather much  Relationships  . Social connections:    Talks on phone: Not on file    Gets together: Not on file    Attends religious service: Not on file    Active member of club or organization: Not on file  Attends meetings of clubs or organizations: Not on file    Relationship status: Not on file  . Intimate partner violence:    Fear of current or ex partner: Not on file    Emotionally abused: Not on file    Physically abused: Not on file    Forced sexual activity: Not on file  Other Topics Concern  . Not on file  Social History Narrative  . Not on file    FAMILY HISTORY: Family History  Problem Relation Age of Onset  . Diabetes Mother   . Diabetes Father   . Heart attack Father 61       Bypass  surgeries; 3 MI's  . Schizophrenia Other   . Heart attack Paternal Grandfather 39  . Other Neg Hx     ALLERGIES:  is allergic to prednisone.  MEDICATIONS:  Current Outpatient Medications  Medication Sig Dispense Refill  . atorvastatin (LIPITOR) 20 MG tablet Take 1 tablet (20 mg total) by mouth daily. 90 tablet 1  . betamethasone valerate (VALISONE) 0.1 % cream APP AA ON HANDS D DURING DIFFICULT FLARES  3  . ELIDEL 1 % cream APPLY ON THE AFFECTED SKIN D  3  . hydrOXYzine (ATARAX/VISTARIL) 50 MG tablet Take 1 tablet (50 mg total) by mouth 3 (three) times daily as needed. 30 tablet 0  . losartan (COZAAR) 25 MG tablet Take 1 tablet (25 mg total) by mouth daily. 90 tablet 3  . Lurasidone HCl (LATUDA) 120 MG TABS Take 1 tablet by mouth daily.    . meloxicam (MOBIC) 15 MG tablet Take 0.5-1 tablets (7.5-15 mg total) by mouth daily. 20 tablet 0  . varenicline (CHANTIX STARTING MONTH PAK) 0.5 MG X 11 & 1 MG X 42 tablet Take one 0.5 mg tablet by mouth once daily for 3 days, then increase to one 0.5 mg tablet twice daily for 4 days, then increase to one 1 mg tablet twice daily. 53 tablet 0   No current facility-administered medications for this visit.       Marland Kitchen  PHYSICAL EXAMINATION: ECOG PERFORMANCE STATUS: 0 - Asymptomatic  There were no vitals filed for this visit. There were no vitals filed for this visit.  Physical Exam  Constitutional: He is oriented to person, place, and time and well-developed, well-nourished, and in no distress.  HENT:  Head: Normocephalic and atraumatic.  Mouth/Throat: Oropharynx is clear and moist. No oropharyngeal exudate.  Eyes: Pupils are equal, round, and reactive to light.  Neck: Normal range of motion. Neck supple.  Cardiovascular: Normal rate and regular rhythm.  Pulmonary/Chest: No respiratory distress. He has no wheezes.  Abdominal: Soft. Bowel sounds are normal. He exhibits no distension and no mass. There is no abdominal tenderness. There is no  rebound and no guarding.  Musculoskeletal: Normal range of motion.        General: No tenderness or edema.  Neurological: He is alert and oriented to person, place, and time.  Skin: Skin is warm.  Psychiatric: Affect normal.     LABORATORY DATA:  I have reviewed the data as listed Lab Results  Component Value Date   WBC 11.0 (H) 04/23/2018   HGB 16.8 04/23/2018   HCT 49.7 04/23/2018   MCV 95.9 04/23/2018   PLT 285 04/23/2018   Recent Labs    02/12/18 1422 04/23/18 2125  NA 137 136  K 4.4 4.4  CL 101 101  CO2 26 27  GLUCOSE 88 105*  BUN 7 9  CREATININE 1.16  1.27*  CALCIUM 9.7 8.7*  GFRNONAA 83 >60  GFRAA 96 >60  PROT 7.1  --   AST 22  --   ALT 28  --   BILITOT 0.4  --     RADIOGRAPHIC STUDIES: I have personally reviewed the radiological images as listed and agreed with the findings in the report. No results found.  ASSESSMENT & PLAN:   No problem-specific Assessment & Plan notes found for this encounter. All questions were answered. The patient knows to call the clinic with any problems, questions or concerns.    Cammie Sickle, MD 06/25/2018 8:28 AM

## 2018-08-01 ENCOUNTER — Ambulatory Visit: Payer: Medicaid Other | Admitting: Family Medicine

## 2018-08-13 ENCOUNTER — Encounter: Payer: Self-pay | Admitting: Family Medicine

## 2018-08-13 ENCOUNTER — Other Ambulatory Visit (HOSPITAL_COMMUNITY)
Admission: RE | Admit: 2018-08-13 | Discharge: 2018-08-13 | Disposition: A | Discharge status: Home / Self Care | Payer: Medicaid Other | Source: Ambulatory Visit | Attending: Family Medicine | Admitting: Family Medicine

## 2018-08-13 ENCOUNTER — Other Ambulatory Visit: Payer: Self-pay

## 2018-08-13 ENCOUNTER — Ambulatory Visit (INDEPENDENT_AMBULATORY_CARE_PROVIDER_SITE_OTHER): Payer: Medicaid Other | Admitting: Family Medicine

## 2018-08-13 VITALS — BP 130/76 | HR 94 | Temp 98.8°F | Resp 16 | Ht 69.0 in | Wt 185.1 lb

## 2018-08-13 DIAGNOSIS — J069 Acute upper respiratory infection, unspecified: Secondary | ICD-10-CM

## 2018-08-13 DIAGNOSIS — F250 Schizoaffective disorder, bipolar type: Secondary | ICD-10-CM

## 2018-08-13 DIAGNOSIS — Z1159 Encounter for screening for other viral diseases: Secondary | ICD-10-CM

## 2018-08-13 DIAGNOSIS — M545 Low back pain: Secondary | ICD-10-CM

## 2018-08-13 DIAGNOSIS — Z716 Tobacco abuse counseling: Secondary | ICD-10-CM

## 2018-08-13 DIAGNOSIS — Z113 Encounter for screening for infections with a predominantly sexual mode of transmission: Secondary | ICD-10-CM | POA: Insufficient documentation

## 2018-08-13 DIAGNOSIS — I10 Essential (primary) hypertension: Secondary | ICD-10-CM

## 2018-08-13 DIAGNOSIS — R079 Chest pain, unspecified: Secondary | ICD-10-CM

## 2018-08-13 DIAGNOSIS — E782 Mixed hyperlipidemia: Secondary | ICD-10-CM

## 2018-08-13 DIAGNOSIS — D729 Disorder of white blood cells, unspecified: Secondary | ICD-10-CM

## 2018-08-13 DIAGNOSIS — Z79899 Other long term (current) drug therapy: Secondary | ICD-10-CM

## 2018-08-13 DIAGNOSIS — E663 Overweight: Secondary | ICD-10-CM

## 2018-08-13 DIAGNOSIS — M542 Cervicalgia: Secondary | ICD-10-CM

## 2018-08-13 DIAGNOSIS — G8929 Other chronic pain: Secondary | ICD-10-CM

## 2018-08-13 MED ORDER — PROMETHAZINE-DM 6.25-15 MG/5ML PO SYRP: 5.0000 mL | ORAL_SOLUTION | Freq: Four times a day (QID) | ORAL | 0 refills | Status: DC | PRN

## 2018-08-13 MED ORDER — GUAIFENESIN ER 600 MG PO TB12: 600.0000 mg | ORAL_TABLET | Freq: Two times a day (BID) | ORAL | 0 refills | Status: DC

## 2018-08-13 MED ORDER — FLUTICASONE PROPIONATE 50 MCG/ACT NA SUSP: 2.0000 | Freq: Every day | NASAL | 6 refills | Status: DC

## 2018-08-13 NOTE — Assessment & Plan Note (Signed)
Continue seeing psychiatry

## 2018-08-13 NOTE — Assessment & Plan Note (Signed)
Stable on current medication regimen. DASH diet discussed.

## 2018-08-13 NOTE — Assessment & Plan Note (Signed)
Start chantix with caution.

## 2018-08-13 NOTE — Progress Notes (Signed)
Name: David Phillips   MRN: [id number]    DOB: 12/10/1985   Date:08/13/2018       Progress Note  Subjective  Chief Complaint  Chief Complaint  Patient presents with  . URI    congested, drainage (green)cough  . Hepatitis C    discuss possible Hep C    HPI  Pt presents for acute visit, and he has not been seen since September 2019 in our office.  URI: 3-4 days of sinus congestion and green nasal drainage and mild cough.  He has taken dayquil, nyquil, tussin, all without relief.  He denies chest pain, shortness of breath, fevers/chills, abdominal pain, nausea/vomiting, no ear pain/pressure.   Tobacco Abuse: Was told by his psychiatrist to hold off on taking the medication until he has someone that can stay with him.  If he becomes suicidal, he needs to seek treatment ASAP.  Discussed treatment in detail.  He is smoking 1ppd at this time - much less than his 2-2.5ppd that he was up to at his peak.  Schizoaffective Disorder: He is seeing psychiatry at this time - taking latuda without issue.   HTN: Seeing Dr. Mariah Milling due to chest pain and abnormal EKG.  He is taking Losartan 25mg  and doing well on this.  HE denies chest pain, shortness of breath, or BLE edema. Was told to return to Dr. Mariah Milling PRN if CP returns, has not had any additional episodes.   STI Screening: He has been having unprotected intercourse related.  He also notes a friend recently told him they have Hep C and he was sharing an alcoholic drink with this person recently.  Wants to have Hepatitis panel and STI panel today.  Hip pain/back pain/neck pain: Seeing chiropractor for his neck pain.  Would like to see pain specialist. Was taking meloxicam, but has history of gastric ulcer, so we will d/c this.  Was referred to pain management in August 2019, but never went.  We will re-refer today.  Did recommend PT but he states that is too expensive and time consuming.  HLD: Has been watching his diet, never started atorvastatin  because he wanted to do lifestyle changes first. Denies chest pain or shortness of breath.  Neutrophilia: Determined to be mild by Dr. Donneta Romberg.     Patient Active Problem List   Diagnosis Date Noted  . Chest pain 05/29/2018  . Hyperlipidemia 03/19/2018  . Chronic left hip pain 02/12/2018  . Chronic left-sided low back pain without sciatica 02/12/2018  . Neutrophilia 02/12/2018  . Schizoaffective disorder, bipolar type (HCC) 02/12/2018  . Encounter for tobacco use cessation counseling 02/12/2018  . Overweight (BMI 25.0-29.9) 02/12/2018  . Allergic rhinitis 03/23/2017  . Hypertension 02/23/2017    Social History   Tobacco Use  . Smoking status: Current Every Day Smoker    Packs/day: 1.00    Years: 20.00    Pack years: 20.00    Types: Cigarettes    Start date: 60  . Smokeless tobacco: Former Engineer, water Use Topics  . Alcohol use: Yes    Alcohol/week: 6.0 standard drinks    Types: 6 Cans of beer per week     Current Outpatient Medications:  .  atorvastatin (LIPITOR) 20 MG tablet, Take 1 tablet (20 mg total) by mouth daily., Disp: 90 tablet, Rfl: 1 .  betamethasone valerate (VALISONE) 0.1 % cream, APP AA ON HANDS D DURING DIFFICULT FLARES, Disp: , Rfl: 3 .  ELIDEL 1 % cream, APPLY ON THE AFFECTED  SKIN D, Disp: , Rfl: 3 .  hydrOXYzine (ATARAX/VISTARIL) 50 MG tablet, Take 1 tablet (50 mg total) by mouth 3 (three) times daily as needed., Disp: 30 tablet, Rfl: 0 .  losartan (COZAAR) 25 MG tablet, Take 1 tablet (25 mg total) by mouth daily., Disp: 90 tablet, Rfl: 3 .  Lurasidone HCl (LATUDA) 120 MG TABS, Take 1 tablet by mouth daily., Disp: , Rfl:  .  varenicline (CHANTIX STARTING MONTH PAK) 0.5 MG X 11 & 1 MG X 42 tablet, Take one 0.5 mg tablet by mouth once daily for 3 days, then increase to one 0.5 mg tablet twice daily for 4 days, then increase to one 1 mg tablet twice daily., Disp: 53 tablet, Rfl: 0  Allergies  Allergen Reactions  . Prednisone Nausea And Vomiting     I personally reviewed active problem list, medication list, allergies, health maintenance, notes from last encounter, lab results with the patient/caregiver today.  ROS  Ten systems reviewed and is negative except as mentioned in HPI.  Objective  Vitals:   08/13/18 1334  BP: 130/76  Pulse: 94  Resp: 16  Temp: 98.8 F (37.1 C)  TempSrc: Oral  SpO2: 97%  Weight: 185 lb 1.6 oz (84 kg)  Height: 5\' 9"  (1.753 m)    Body mass index is 27.33 kg/m.  Nursing Note and Vital Signs reviewed.  Physical Exam  Constitutional: Patient appears well-developed and well-nourished. No distress.  HEENT: head atraumatic, normocephalic, pupils equal and reactive to light, Bilateral TM's without erythema or effusion,  bilateral maxillary and frontal sinuses are non-tender, neck supple without lymphadenopathy, throat within normal limits - no erythema or exudate, no tonsillar swelling Cardiovascular: Normal rate, regular rhythm and normal heart sounds.  No murmur heard. No BLE edema. Pulmonary/Chest: Effort normal and breath sounds clear bilaterally. No respiratory distress. Abdominal: Soft, bowel sounds normal, there is no tenderness, no HSM Psychiatric: Patient has a normal mood and affect. behavior is normal. Judgment and thought content normal.  No results found for this or any previous visit (from the past 72 hour(s)).  Assessment & Plan  Problem List Items Addressed This Visit      Cardiovascular and Mediastinum   Hypertension    Stable on current medication regimen. DASH diet discussed.      Relevant Orders   COMPLETE METABOLIC PANEL WITH GFR     Other   Chronic left-sided low back pain without sciatica    Re-refer to pain management.  Encouraged PT, but does not want to go.      Relevant Orders   Ambulatory referral to Pain Clinic   Neutrophilia    Notes from Dr. Donneta Romberg reviewed.  We will monitor today.      Relevant Orders   CBC w/Diff/Platelet   Schizoaffective  disorder, bipolar type [facility])    Continue seeing psychiatry      Encounter for tobacco use cessation counseling    Start chantix with caution.       Overweight (BMI 25.0-29.9)   Relevant Orders   COMPLETE METABOLIC PANEL WITH GFR   Hyperlipidemia    Labs; continue medication.      Relevant Orders   Lipid panel   Chest pain    Other Visit Diagnoses    Viral upper respiratory tract infection    -  Primary   Relevant Medications   guaiFENesin (MUCINEX) 600 MG 12 hr tablet   promethazine-dextromethorphan (PROMETHAZINE-DM) 6.25-15 MG/5ML syrup   fluticasone (FLONASE) 50 MCG/ACT nasal spray  Routine screening for STI (sexually transmitted infection)       Relevant Orders   Hepatitis panel, acute   HIV Antibody (routine testing w rflx)   RPR   Urine cytology ancillary only   Need for hepatitis C screening test       Relevant Orders   Hepatitis panel, acute   Chronic neck pain       Relevant Orders   Ambulatory referral to Pain Clinic   High risk medication use       Relevant Orders   Lipid panel   COMPLETE METABOLIC PANEL WITH GFR   CBC w/Diff/Platelet      -Red flags and when to present for emergency care or RTC including fever >101.63F, chest pain, shortness of breath, new/worsening/un-resolving symptoms, reviewed with patient at time of visit. Follow up and care instructions discussed and provided in AVS.

## 2018-08-13 NOTE — Assessment & Plan Note (Signed)
Labs; continue medication.

## 2018-08-13 NOTE — Assessment & Plan Note (Signed)
Notes from Dr. Donneta Romberg reviewed.  We will monitor today.

## 2018-08-13 NOTE — Assessment & Plan Note (Signed)
Re-refer to pain management.  Encouraged PT, but does not want to go.

## 2018-08-13 NOTE — Patient Instructions (Addendum)
Here are some resources to help you if you feel you are in a mental health crisis:  National Suicide Prevention Lifeline - Call 706-475-8511  for help - Website with more resources: ARanked.fi  [employer] Crisis [facility] - Call [phone] for help. - Mobile Crisis Program available 24 hours a day, 365 days a year. - Available for anyone of any age in [place] & [place] counties.  [facility] - Address: [address], [place] - Telephone: [phone]  - Hours of Operation: Sunday - Saturday - 8:00 a.m. - 8:00 p.m. - Medicaid, Medicare (Government Issued Only), BCBS, and [employer] - Pay - Crisis Management, Outpatient Individual & Group Therapy, Psychiatrists on-site to provide medication management, In-Home Psychiatric Care, and Peer Support Care.  [facility] - Call [phone] for help. - Mobile Crisis Program available 24 hours a day, 365 days a year. - Available for anyone of any age in [place] & [place]    Upper Respiratory Infection, Adult An upper respiratory infection (URI) affects the nose, throat, and upper air passages. URIs are caused by germs (viruses). The most common type of URI is often called "the common cold." Medicines cannot cure URIs, but you can do things at home to relieve your symptoms. URIs usually get better within 7-10 days. Follow these instructions at home: Activity  Rest as needed.  If you have a fever, stay home from work or school until your fever is gone, or until your doctor says you may return to work or school. ? You should stay home until you cannot spread the infection anymore (you are not contagious). ? Your doctor may have you wear a face mask so you have less risk of spreading the infection. Relieving symptoms  Gargle with a salt-water mixture 3-4 times a day or as needed. To make a salt-water mixture, completely dissolve -1 tsp of  salt in 1 cup of warm water.  Use a cool-mist humidifier to add moisture to the air. This can help you breathe more easily. Eating and drinking   Drink enough fluid to keep your pee (urine) pale yellow.  Eat soups and other clear broths. General instructions   Take over-the-counter and prescription medicines only as told by your doctor. These include cold medicines, fever reducers, and cough suppressants.  Do not use any products that contain nicotine or tobacco. These include cigarettes and e-cigarettes. If you need help quitting, ask your doctor.  Avoid being where people are smoking (avoid secondhand smoke).  Make sure you get regular shots and get the flu shot every year.  Keep all follow-up visits as told by your doctor. This is important. How to avoid spreading infection to others   Wash your hands often with soap and water. If you do not have soap and water, use hand sanitizer.  Avoid touching your mouth, face, eyes, or nose.  Cough or sneeze into a tissue or your sleeve or elbow. Do not cough or sneeze into your hand or into the air. Contact a doctor if:  You are getting worse, not better.  You have any of these: ? A fever. ? Chills. ? Brown or red mucus in your nose. ? Yellow or brown fluid (discharge)coming from your nose. ? Pain in your face, especially when you bend forward. ? Swollen neck glands. ? Pain with swallowing. ? White areas in the back of your throat. Get help right away if:  You have shortness of breath that gets worse.  You  have very bad or constant: ? Headache. ? Ear pain. ? Pain in your forehead, behind your eyes, and over your cheekbones (sinus pain). ? Chest pain.  You have long-lasting (chronic) lung disease along with any of these: ? Wheezing. ? Long-lasting cough. ? Coughing up blood. ? A change in your usual mucus.  You have a stiff neck.  You have changes in your: ? Vision. ? Hearing. ? Thinking. ? Mood. Summary  An  upper respiratory infection (URI) is caused by a germ called a virus. The most common type of URI is often called "the common cold."  URIs usually get better within 7-10 days.  Take over-the-counter and prescription medicines only as told by your doctor. This information is not intended to replace advice given to you by your health care provider. Make sure you discuss any questions you have with your health care provider. Document Released: 11/23/2007 Document Revised: 01/27/2017 Document Reviewed: 01/27/2017 Elsevier Interactive Patient Education  2019 [employer].  [employer] Vaporizer A cool mist vaporizer is a device that releases a cool mist into the air. If you have a cough or a cold, using a vaporizer may help relieve your symptoms. The mist adds moisture to the air, which may help thin your mucus and make it less sticky. When your mucus is thin and less sticky, it easier for you to breathe and to cough up secretions. Do not use a vaporizer if you are allergic to mold. Follow these instructions at home:  Follow the instructions that come with the vaporizer.  Do not use anything other than distilled water in the vaporizer.  Do not run the vaporizer all of the time. Doing that can cause mold or bacteria to grow in the vaporizer.  Clean the vaporizer after each time that you use it.  Clean and dry the vaporizer well before storing it.  Stop using the vaporizer if your breathing symptoms get worse. This information is not intended to replace advice given to you by your health care provider. Make sure you discuss any questions you have with your health care provider. Document Released: 03/03/2004 Document Revised: 12/25/2015 Document Reviewed: 09/05/2015 Elsevier Interactive Patient Education  2019 [employer].

## 2018-08-14 LAB — RPR: RPR Ser Ql: NONREACTIVE

## 2018-08-14 LAB — CBC WITH DIFFERENTIAL/PLATELET
Absolute Monocytes: 1072 {cells}/uL — ABNORMAL HIGH (ref 200–950)
Basophils Absolute: 106 {cells}/uL (ref 0–200)
Basophils Relative: 0.7 %
Eosinophils Absolute: 695 {cells}/uL — ABNORMAL HIGH (ref 15–500)
Eosinophils Relative: 4.6 %
HCT: 47.2 % (ref 38.5–50.0)
Hemoglobin: 16.8 g/dL (ref 13.2–17.1)
Lymphs Abs: 2643 {cells}/uL (ref 850–3900)
MCH: 33.4 pg — ABNORMAL HIGH (ref 27.0–33.0)
MCHC: 35.6 g/dL (ref 32.0–36.0)
MCV: 93.8 fL (ref 80.0–100.0)
MPV: 10.3 fL (ref 7.5–12.5)
Monocytes Relative: 7.1 %
Neutro Abs: 10585 {cells}/uL — ABNORMAL HIGH (ref 1500–7800)
Neutrophils Relative %: 70.1 %
Platelets: 365 Thousand/uL (ref 140–400)
RBC: 5.03 Million/uL (ref 4.20–5.80)
RDW: 13 % (ref 11.0–15.0)
Total Lymphocyte: 17.5 %
WBC: 15.1 Thousand/uL — ABNORMAL HIGH (ref 3.8–10.8)

## 2018-08-14 LAB — HEPATITIS PANEL, ACUTE
HEP A IGM: NONREACTIVE
HEP B S AG: NONREACTIVE
Hep B C IgM: NONREACTIVE
Hepatitis C Ab: NONREACTIVE
SIGNAL TO CUT-OFF: 0.02 (ref <1.00)

## 2018-08-14 LAB — COMPLETE METABOLIC PANEL WITHOUT GFR
AG Ratio: 1.8 (calc) (ref 1.0–2.5)
ALT: 25 U/L (ref 9–46)
AST: 22 U/L (ref 10–40)
Albumin: 4.2 g/dL (ref 3.6–5.1)
Alkaline phosphatase (APISO): 68 U/L (ref 36–130)
BUN: 7 mg/dL (ref 7–25)
CO2: 27 mmol/L (ref 20–32)
Calcium: 9.4 mg/dL (ref 8.6–10.3)
Chloride: 101 mmol/L (ref 98–110)
Creat: 0.92 mg/dL (ref 0.60–1.35)
GFR, Est African American: 126 mL/min/1.73m2
GFR, Est Non African American: 109 mL/min/1.73m2
Globulin: 2.3 g/dL (ref 1.9–3.7)
Glucose, Bld: 76 mg/dL (ref 65–99)
Potassium: 4.3 mmol/L (ref 3.5–5.3)
Sodium: 136 mmol/L (ref 135–146)
Total Bilirubin: 0.4 mg/dL (ref 0.2–1.2)
Total Protein: 6.5 g/dL (ref 6.1–8.1)

## 2018-08-14 LAB — LIPID PANEL
Cholesterol: 220 mg/dL — ABNORMAL HIGH
HDL: 41 mg/dL
LDL Cholesterol (Calc): 134 mg/dL — ABNORMAL HIGH
Non-HDL Cholesterol (Calc): 179 mg/dL — ABNORMAL HIGH
Total CHOL/HDL Ratio: 5.4 (calc) — ABNORMAL HIGH
Triglycerides: 292 mg/dL — ABNORMAL HIGH

## 2018-08-14 LAB — HIV ANTIBODY (ROUTINE TESTING W REFLEX): HIV 1&2 Ab, 4th Generation: NONREACTIVE

## 2018-08-15 LAB — URINE CYTOLOGY ANCILLARY ONLY
Chlamydia: NEGATIVE
Neisseria Gonorrhea: NEGATIVE

## 2018-08-20 ENCOUNTER — Ambulatory Visit: Payer: Medicaid Other | Admitting: Family Medicine

## 2018-09-06 ENCOUNTER — Other Ambulatory Visit: Payer: Self-pay | Admitting: Family Medicine

## 2018-09-06 DIAGNOSIS — J069 Acute upper respiratory infection, unspecified: Secondary | ICD-10-CM

## 2018-09-18 ENCOUNTER — Ambulatory Visit: Payer: Medicaid Other | Admitting: Nurse Practitioner

## 2018-10-04 NOTE — Progress Notes (Signed)
Patient's Name: David Phillips  MRN: [id number]  Referring Provider: Hubbard Hartshorn, FNP  DOB: 1985-09-02  PCP: Hubbard Hartshorn, FNP  DOS: 10/10/2018  Note by: David Santa, MD  Service setting: Ambulatory outpatient  Specialty: Interventional Pain Management  Location: [facility] Virtual Visit  Visit type: Initial Patient Evaluation  Patient type: New Patient   Pain Management Virtual Encounter Note - Virtual Visit via [place] (real-time audio visits between healthcare provider and patient).  Patient's Phone No.:  [phone] (home); [phone] (mobile); (Preferred) [phone] [email or web address] .[id number] [address], [address] [place] [address] Phone: [phone] Fax: [phone]   Pre-screening note:  Our staff contacted Mr. Ptacek and offered him an "in person", "face-to-face" appointment versus a telephone encounter. He indicated preferring the telephone encounter, at this time.  Primary Reason(s) for Visit: Tele-Encounter for initial evaluation of one or more chronic problems (new to examiner) potentially causing chronic pain, and posing a threat to normal musculoskeletal function. (Level of risk: High) CC: Shoulder Pain (bilateral); Hip Pain (bilateral); and Knee Pain (bilateral)  I contacted David Phillips on 10/10/2018 at 2:31 PM via video conference and clearly identified myself as David Santa, MD. I verified that I was speaking with the correct person using two identifiers (Name and date of birth: September 01, 1985).  Advanced Informed Consent I sought verbal advanced consent from David Phillips for virtual visit interactions. I informed Mr. Bonn of possible security and privacy concerns, risks, and limitations associated with providing "not-in-person" medical evaluation and management services. I also informed Mr. Silversmith of the availability of  "in-person" appointments. Finally, I informed him that there would be a charge for the virtual visit and that he could be  personally, fully or partially, financially responsible for it. [name] expressed understanding and agreed to proceed.   HPI  David Phillips is a 33 y.o. year old, male patient, contacted today for an initial evaluation of his chronic pain. He has Hypertension; Allergic rhinitis; Chronic left hip pain; Chronic left-sided low back pain without sciatica; Neutrophilia; Schizoaffective disorder, bipolar type ([place]); Encounter for tobacco use cessation counseling; Overweight (BMI 25.0-29.9); Hyperlipidemia; and Chest pain on their problem list.  Pain Assessment: Location: Right, Left Shoulder Radiating: denies Onset: More than a month ago Duration: Chronic pain Quality: Sharp, Throbbing Severity: 7 /10 (subjective, self-reported pain score)  Effect on ADL: difficulty performing daily activities Timing: Constant Modifying factors: nothing BP:    HR:    Onset and Duration: Gradual Cause of pain: Work related accident or event Severity: Getting worse, NAS-11 at its worse: 9/10, NAS-11 at its best: 7.7/10, NAS-11 now: 9/10 and NAS-11 on the average: 8/10 Timing: Not influenced by the time of the day, During activity or exercise, After activity or exercise and After a period of immobility Aggravating Factors: Motion, Prolonged sitting, Prolonged standing and Walking Alleviating Factors: no alleviating factors Associated Problems: Pain that does not allow patient to sleep Quality of Pain: Sharp and Throbbing Previous Examinations or Tests: MRI scan, Chiropractic evaluation and Psychiatric evaluation Previous Treatments: Physical Therapy and TENS   33 year old male who endorses multiple pain generators along various joints including bilateral knees, bilateral hips, low back and bilateral shoulders.  Patient states that the pain is been present for years.  This was related to a  work-related accident that the patient did not elaborate.  Patient has had an MRI of his  hip performed at [facility].  Results of this are not in our computer.  Patient has been seen by a chiropractor and has had physical therapy which was not very helpful.  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  Patient denies any history of drug abuse but he does have 1 prior urine drug screen that is positive for cocaine.  He was not forthcoming about this.  Criminal history positive for DUI conviction in 2008.  Patient denies having any injections performed.  Not on chronic opioid therapy.  The patient was informed that my practice is divided into two sections: an interventional pain management section, as well as a completely separate and distinct medication management section. I explained that I have procedure days for my interventional therapies, and evaluation days for follow-ups and medication management. Because of the amount of documentation required during both, they are kept separated. This means that there is the possibility that he may be scheduled for a procedure on one day, and medication management the next. I have also informed him that because of staffing and facility limitations, I no longer take patients for medication management only. To illustrate the reasons for this, I gave the patient the example of surgeons, and how inappropriate it would be to refer a patient to his/her care, just to write for the post-surgical antibiotics on a surgery done by a different surgeon.   Because interventional pain management is my board-certified specialty, the patient was informed that joining my practice means that they are open to any and all interventional therapies. I made it clear that this does not mean that they will be forced to have any procedures done. What this means is that I believe interventional therapies to be essential part of the diagnosis and proper  management of chronic pain conditions. Therefore, patients not interested in these interventional alternatives will be better served under the care of a different practitioner.  The patient was also made aware of my Comprehensive Pain Management Safety Guidelines where by joining my practice, they limit all of their nerve blocks and joint injections to those done by our practice, for as long as we are retained to manage their care.   Historic Controlled Substance Pharmacotherapy Review   Historical Monitoring: The patient  reports current drug use. List of all UDS Test(s): Lab Results  Component Value Date   MDMA NONE DETECTED 12/22/2016   MDMA NEGATIVE 04/13/2013   COCAINSCRNUR NONE DETECTED 12/22/2016   COCAINSCRNUR POSITIVE 04/13/2013   PCPSCRNUR NONE DETECTED 12/22/2016   PCPSCRNUR NEGATIVE 04/13/2013   THCU NONE DETECTED 12/22/2016   THCU NEGATIVE 04/13/2013   ETH <5 12/22/2016   List of other Serum/Urine Drug Screening Test(s):  Lab Results  Component Value Date   COCAINSCRNUR NONE DETECTED 12/22/2016   COCAINSCRNUR POSITIVE 04/13/2013   THCU NONE DETECTED 12/22/2016   THCU NEGATIVE 04/13/2013   ETH <5 12/22/2016   Historical Background Evaluation: [place] PMP: PDMP reviewed during this encounter. Six (6) year initial data search conducted.             [place] Department of public safety, offender search: Editor, commissioning Information) Positive for DWI in 2008 Risk Assessment Profile: Aberrant behavior: claims that "nothing else works" and use of illicit substances Risk factors for fatal opioid overdose: age 51-70 years old, caucasian, history of substance abuse and nicotine dependence Fatal overdose hazard ratio (HR): Calculation deferred Non-fatal overdose hazard ratio (HR): Calculation deferred Risk of opioid abuse or dependence: 0.7-3.0%  with doses ? 36 MME/day and 6.1-26% with doses ? 120 MME/day. Substance use disorder (SUD) risk level: High Personal History of Substance Abuse  (SUD-Substance use disorder):  Alcohol: Negative  Illegal Drugs: Negative  Rx Drugs: Negative  ORT Risk Level calculation: Low Risk Opioid Risk Tool - 10/09/18 1328      Family History of Substance Abuse   Alcohol  Negative    Illegal Drugs  Negative    Rx Drugs  Negative      Personal History of Substance Abuse   Alcohol  Negative    Illegal Drugs  Negative    Rx Drugs  Negative      Age   Age between 80-45 years   Yes      History of Preadolescent Sexual Abuse   History of Preadolescent Sexual Abuse  Negative or Male      Psychological Disease   Psychological Disease  Positive    ADD  Negative    OCD  Negative    Bipolar  Negative    Schizophrenia  Positive    Depression  Negative      Total Score   Opioid Risk Tool Scoring  3    Opioid Risk Interpretation  Low Risk      ORT Scoring interpretation table:  Score <3 = Low Risk for SUD  Score between 4-7 = Moderate Risk for SUD  Score >8 = High Risk for Opioid Abuse   PHQ-2 Depression Scale:  Total score:    PHQ-2 Scoring interpretation table: (Score and probability of major depressive disorder)  Score 0 = No depression  Score 1 = 15.4% Probability  Score 2 = 21.1% Probability  Score 3 = 38.4% Probability  Score 4 = 45.5% Probability  Score 5 = 56.4% Probability  Score 6 = 78.6% Probability   PHQ-9 Depression Scale:  Total score:    PHQ-9 Scoring interpretation table:  Score 0-4 = No depression  Score 5-9 = Mild depression  Score 10-14 = Moderate depression  Score 15-19 = Moderately severe depression  Score 20-27 = Severe depression (2.4 times higher risk of SUD and 2.89 times higher risk of overuse)   Pharmacologic Plan: Non-opioid analgesic therapy offered.            Initial impression: High risk for opiate therapy.  Meds   Current Outpatient Medications:  .  atorvastatin (LIPITOR) 20 MG tablet, Take 1 tablet (20 mg total) by mouth daily., Disp: 90 tablet, Rfl: 1 .  betamethasone valerate  (VALISONE) 0.1 % cream, APP AA ON HANDS D DURING DIFFICULT FLARES, Disp: , Rfl: 3 .  ELIDEL 1 % cream, APPLY ON THE AFFECTED SKIN D, Disp: , Rfl: 3 .  fluticasone (FLONASE) 50 MCG/ACT nasal spray, Place 2 sprays into both nostrils daily., Disp: 16 g, Rfl: 6 .  guaiFENesin (MUCINEX) 600 MG 12 hr tablet, Take 1 tablet (600 mg total) by mouth 2 (two) times daily., Disp: 20 tablet, Rfl: 0 .  hydrOXYzine (ATARAX/VISTARIL) 50 MG tablet, Take 1 tablet (50 mg total) by mouth 3 (three) times daily as needed. (Patient taking differently: Take 50 mg by mouth every 6 (six) hours as needed. ), Disp: 30 tablet, Rfl: 0 .  losartan (COZAAR) 25 MG tablet, Take 1 tablet (25 mg total) by mouth daily., Disp: 90 tablet, Rfl: 3 .  Lurasidone HCl (LATUDA) 120 MG TABS, Take 1 tablet by mouth daily., Disp: , Rfl:  .  diclofenac (VOLTAREN) 75 MG EC tablet, Take 1 tablet (  75 mg total) by mouth 2 (two) times daily. Discontinue and refrain from all NSAIDs while on diclofenac., Disp: 60 tablet, Rfl: 1  ROS  Cardiovascular: No reported cardiovascular signs or symptoms such as High blood pressure, coronary artery disease, abnormal heart rate or rhythm, heart attack, blood thinner therapy or heart weakness and/or failure Pulmonary or Respiratory: Smoking Neurological: No reported neurological signs or symptoms such as seizures, abnormal skin sensations, urinary and/or fecal incontinence, being born with an abnormal open spine and/or a tethered spinal cord Review of Past Neurological Studies:  Results for orders placed or performed in visit on 03/21/03  MR Brain Wo Contrast   Narrative   FINDINGS CLINICAL DATA:  CLOSED HEAD INJURY.  FELL GIVING BLOOD TWO MONTHS AGO.  HIT BACK OF HEAD. SEVERE HEADACHES. MRI BRAIN WITHOUT CONTRAST MULTIPLANAR T1- AND T2-WEIGHTED IMAGES WERE OBTAINED WITHOUT CONTRAST.  SAGITTAL T1-WEIGHTED IMAGES ARE UNREMARKABLE.  THERE IS NO OBVIOUS EXTRADURAL HEMATOMA.  NO OBVIOUS CALVARIAL DEFORMITY IS SEEN.   UPPER CERVICAL SPINE FROM C3-4 TO THE BASE OF THE SKULL APPEARS NORMALLY ALIGNED.  PITUITARY GLAND IS NORMAL HEIGHT. AXIAL DIFFUSION IMAGES ARE NORMAL.  AXIAL T2-WEIGHTED IMAGES DEMONSTRATE NORMAL VENTRICLES, CISTERNS AND SULCI.  FLAIR IMAGES SHOW NO ABNORMAL SIGNAL IN THE CORTEX OR WHITE MATTER.  T1- WEIGHTED IMAGES ARE UNREMARKABLE, SHOWING NO AREAS OF OCCULT HEMORRHAGE OR EXTRAAXIAL CEREBRAL FLUID COLLECTION.  CORONAL GRADIENT ECHO IMAGES DEMONSTRATE NO EVIDENCE FOR OCCULT SHEARING INJURY, CORTICAL CONTUSION, OR SUBDURAL HEMATOMA.  CONVENTIONAL CORONAL T2-WEIGHTED IMAGES HIGHLIGHT THE NORMAL INTRACRANIAL ANATOMY. IMPRESSION NEGATIVE CRANIAL MRI WITH SPECIFIC ATTENTION TO THE DETECTION OF RECENT OR REMOTE HEMORRHAGE, EXTRAAXIAL FLUID COLLECTIONS, OR PREVIOUS BRAIN CONTUSION.   Psychological-Psychiatric: Psychiatric disorder Gastrointestinal: No reported gastrointestinal signs or symptoms such as vomiting or evacuating blood, reflux, heartburn, alternating episodes of diarrhea and constipation, inflamed or scarred liver, or pancreas or irrregular and/or infrequent bowel movements Genitourinary: No reported renal or genitourinary signs or symptoms such as difficulty voiding or producing urine, peeing blood, non-functioning kidney, kidney stones, difficulty emptying the bladder, difficulty controlling the flow of urine, or chronic kidney disease Hematological: No reported hematological signs or symptoms such as prolonged bleeding, low or poor functioning platelets, bruising or bleeding easily, hereditary bleeding problems, low energy levels due to low hemoglobin or being anemic Endocrine: No reported endocrine signs or symptoms such as high or low blood sugar, rapid heart rate due to high thyroid levels, obesity or weight gain due to slow thyroid or thyroid disease Rheumatologic: No reported rheumatological signs and symptoms such as fatigue, joint pain, tenderness, swelling, redness, heat,  stiffness, decreased range of motion, with or without associated rash Musculoskeletal: Negative for myasthenia gravis, muscular dystrophy, multiple sclerosis or malignant hyperthermia Work History: Disabled  Allergies  Mr. Nordell is allergic to prednisone.  Laboratory Chemistry  Inflammation Markers (CRP: Acute Phase) (ESR: Chronic Phase) No results found for: CRP, ESRSEDRATE, LATICACIDVEN                       Rheumatology Markers No results found for: RF, ANA, LABURIC, URICUR, LYMEIGGIGMAB, LYMEABIGMQN, HLAB27                      Renal Function Markers Lab Results  Component Value Date   BUN 7 08/13/2018   CREATININE 0.92 08/13/2018   BCR NOT APPLICABLE 08/13/2018   GFRAA 126 08/13/2018   GFRNONAA 109 08/13/2018                               Hepatic Function Markers Lab Results  Component Value Date   AST 22 08/13/2018   ALT 25 08/13/2018   ALBUMIN 4.5 02/23/2017   ALKPHOS 83 02/23/2017   LIPASE 21 08/28/2011                        Electrolytes Lab Results  Component Value Date   NA 136 08/13/2018   K 4.3 08/13/2018   CL 101 08/13/2018   CALCIUM 9.4 08/13/2018                        Neuropathy Markers Lab Results  Component Value Date   HGBA1C 5.4 02/23/2017   HIV NON-REACTIVE 08/13/2018                        CNS Tests No results found for: COLORCSF, APPEARCSF, RBCCOUNTCSF, WBCCSF, POLYSCSF, LYMPHSCSF, EOSCSF, PROTEINCSF, GLUCCSF, JCVIRUS, CSFOLI, IGGCSF, LABACHR, ACETBL                      Bone Pathology Markers No results found for: VD25OH, VD125OH2TOT, VD3125OH2, VD2125OH2, 25OHVITD1, 25OHVITD2, 25OHVITD3, TESTOFREE, TESTOSTERONE                       Coagulation Parameters Lab Results  Component Value Date   INR 1.05 08/28/2011   LABPROT 13.9 08/28/2011   APTT 31 08/28/2011   PLT 365 08/13/2018                        Cardiovascular Markers Lab Results  Component Value Date   HGB 16.8 08/13/2018   HCT 47.2 08/13/2018                          ID Markers Lab Results  Component Value Date   HIV NON-REACTIVE 08/13/2018                        CA Markers No results found for: CEA, CA125, LABCA2                      Endocrine Markers Lab Results  Component Value Date   TSH 1.470 02/23/2017                        Note: Lab results reviewed.    PFSH  Drug: Mr. Finnan  reports current drug use. Alcohol:  reports current alcohol use of about 6.0 standard drinks of alcohol per week. Tobacco:  reports that he has been smoking cigarettes. He started smoking about 22 years ago. He has a 20.00 pack-year smoking history. He has quit using smokeless tobacco. Medical:  has a past medical history of Gastric ulcer and Schizoaffective disorder (HCC). Family: family history includes Diabetes in his father and mother; Heart attack (age of onset: 56) in his father and paternal grandfather; Schizophrenia in an other family member.  No past surgical history on file. Active Ambulatory Problems    Diagnosis Date Noted  . Hypertension 02/23/2017  . Allergic rhinitis 03/23/2017  . Chronic left hip pain 02/12/2018  . Chronic left-sided low back pain without sciatica 02/12/2018  . Neutrophilia 02/12/2018  . Schizoaffective disorder, bipolar type (HCC) 02/12/2018  . Encounter for tobacco use cessation counseling 02/12/2018  . Overweight (BMI 25.0-29.9) 02/12/2018  . Hyperlipidemia 03/19/2018  .   Chest pain 05/29/2018   Resolved Ambulatory Problems    Diagnosis Date Noted  . No Resolved Ambulatory Problems   Past Medical History:  Diagnosis Date  . Gastric ulcer   . Schizoaffective disorder [facility])    Assessment  Primary Diagnosis & Pertinent Problem List: The primary encounter diagnosis was Chronic polyarthritis. Diagnoses of Chronic left hip pain, Chronic left-sided low back pain without sciatica, Schizoaffective disorder, bipolar type (HCC), Chronic pain syndrome, Chronic pain of both shoulders, Cervicalgia, Chronic bilateral low  back pain without sciatica, and Chronic pain of both knees were also pertinent to this visit.  Visit Diagnosis (New problems to examiner): 1. Chronic polyarthritis   2. Chronic left hip pain   3. Chronic left-sided low back pain without sciatica   4. Schizoaffective disorder, bipolar type ([place])   5. Chronic pain syndrome   6. Chronic pain of both shoulders   7. Cervicalgia   8. Chronic bilateral low back pain without sciatica   9. Chronic pain of both knees    33 year old male with a chief complaint of polyarthralgias starting with bilateral knees, bilateral hip, left greater than right, bilateral shoulder pain as well as chronic neck pain.  Patient states that this was related to a work accident but did not elaborate on the details.  I informed the patient that because I am unable to conduct a physical exam, this virtual visit will be fairly limited however we can proceed with diagnostic work-up to better understand the etiology of his diffuse widespread polyarthralgias.  There are no imaging studies present that correlate with his pain generators so I will obtain x-rays of his cervical spine, bilateral shoulders, lumbar spine, bilateral hips, bilateral knees.  Results from these imaging studies will be used to guide management and treatment plan which may include injections and/or advanced imaging if x-rays are concerning.  Pending imaging results, patient could be a candidate for intra-articular shoulder steroid injections, suprascapular nerve block, lumbar facet medial branch nerve blocks, lumbar epidural steroid injection, intra-articular hip injection, bilateral genicular nerve block, bilateral intra-articular knee injection, bilateral intra-articular knee steroid injection.  Patient denies having received any injections in the past.  Furthermore I was very clear with the patient regarding medication management.  Given the patient's previous substance abuse history and criminal history along  with the psych history of schizoaffective disorder currently on Latuda, patient is high risk for substance abuse/misuse and opioid therapy will not be a part of the patient's treatment plan.  Patient endorsed understanding.  Patient was requesting medication to help with his pain and we discussed non-opioid analgesics.  Trial of diclofenac 75 mg twice daily as below.  Patient instructed to refrain from all other NSAIDs while on this medication.  Patient endorsed understanding.  Future considerations could include gabapentin, Lyrica, Cymbalta however will need to discuss with patient's mental health provider as patient is on [place] and could interfere with his psych management.  Muscle relaxers could also be a treatment option for this patient.  Not a candidate for chronic opioid management.  Patient instructed to follow-up after he has obtained x-rays as below and after he has contacted [facility] to fax his previous hip MRI.  Patient endorsed understanding.  Note:      Not a candidate for chronic opioid therapy for the reasons listed above.  Problem-specific plan: No problem-specific Assessment & Plan notes found for this encounter.  Lab Orders  No laboratory test(s) ordered today    Imaging Orders     DG  Cervical Spine With Flex & Extend     DG HIP UNILAT W OR W/O PELVIS 2-3 VIEWS RIGHT     DG HIP UNILAT W OR W/O PELVIS 2-3 VIEWS LEFT     DG Knee 1-2 Views Right     DG Knee 1-2 Views Left     DG Lumbar Spine Complete W/Bend     DG Shoulder Left     DG Shoulder Right Referral Orders  No referral(s) requested today   Procedure Orders    No procedure(s) ordered today   Pharmacotherapy (current): Medications ordered:  Meds ordered this encounter  Medications  . diclofenac (VOLTAREN) 75 MG EC tablet    Sig: Take 1 tablet (75 mg total) by mouth 2 (two) times daily. Discontinue and refrain from all NSAIDs while on diclofenac.    Dispense:  60 tablet    Refill:  1   Medications  administered during this visit: Gentle Medina had no medications administered during this visit.   Pharmacological management options:  Opioid Analgesics: N/A.  Not a candidate for chronic opioid therapy for reasons listed above.  Membrane stabilizer: To be determined at a later time  Muscle relaxant: To be determined at a later time  NSAID: To be determined at a later time  Other analgesic(s): To be determined at a later time   Interventional management options: Mr. Hush was informed that there is no guarantee that he would be a candidate for interventional therapies. The decision will be based on the results of diagnostic studies, as well as Mr. Tesch's risk profile.  Procedure(s) under consideration:   intra-articular shoulder steroid injections, suprascapular nerve block, lumbar facet medial branch nerve blocks, lumbar epidural steroid injection, intra-articular hip injection, bilateral genicular nerve block, bilateral intra-articular knee injection, bilateral intra-articular knee steroid injection.   Provider-requested follow-up: No follow-ups on file.  Future Appointments  Date Time Provider Department Center  11/13/2018  2:00 PM Boyce, Emily E, FNP [facility]    Primary Care Physician: Boyce, Emily E, FNP Location: [facility] Note by:  , MD Date: 10/10/2018; Time: 2:31 PM 

## 2018-10-09 ENCOUNTER — Encounter: Payer: Self-pay | Admitting: Student in an Organized Health Care Education/Training Program

## 2018-10-10 ENCOUNTER — Other Ambulatory Visit: Payer: Self-pay

## 2018-10-10 ENCOUNTER — Ambulatory Visit
Payer: Medicaid Other | Attending: Student in an Organized Health Care Education/Training Program | Admitting: Student in an Organized Health Care Education/Training Program

## 2018-10-10 DIAGNOSIS — M130 Polyarthritis, unspecified: Secondary | ICD-10-CM

## 2018-10-10 DIAGNOSIS — M542 Cervicalgia: Secondary | ICD-10-CM

## 2018-10-10 DIAGNOSIS — M545 Low back pain, unspecified: Secondary | ICD-10-CM

## 2018-10-10 DIAGNOSIS — M25512 Pain in left shoulder: Secondary | ICD-10-CM

## 2018-10-10 DIAGNOSIS — M25552 Pain in left hip: Secondary | ICD-10-CM

## 2018-10-10 DIAGNOSIS — M25562 Pain in left knee: Secondary | ICD-10-CM

## 2018-10-10 DIAGNOSIS — M25511 Pain in right shoulder: Secondary | ICD-10-CM

## 2018-10-10 DIAGNOSIS — F250 Schizoaffective disorder, bipolar type: Secondary | ICD-10-CM

## 2018-10-10 DIAGNOSIS — G894 Chronic pain syndrome: Secondary | ICD-10-CM

## 2018-10-10 DIAGNOSIS — M25561 Pain in right knee: Secondary | ICD-10-CM

## 2018-10-10 DIAGNOSIS — G8929 Other chronic pain: Secondary | ICD-10-CM

## 2018-10-10 MED ORDER — DICLOFENAC SODIUM 75 MG PO TBEC: 75.0000 mg | DELAYED_RELEASE_TABLET | Freq: Two times a day (BID) | ORAL | 1 refills | Status: DC

## 2018-11-13 ENCOUNTER — Telehealth: Payer: Self-pay | Admitting: Student in an Organized Health Care Education/Training Program

## 2018-11-13 ENCOUNTER — Ambulatory Visit: Payer: Medicaid Other | Admitting: Family Medicine

## 2018-11-13 NOTE — Telephone Encounter (Signed)
Pt called stating that he needs a PA for his anti inflammatory medication. He states that the pharmacy says they have sent over a PA before and nothing has been done. Pt states that he can't keep only getting a few pills at a time because it costs more to do it that way.

## 2018-11-13 NOTE — Telephone Encounter (Signed)
Called patient to see what preferred drugs he has tried prior to being placed on diclofenac.  Voicemail left asking him to call back.

## 2018-11-14 ENCOUNTER — Telehealth: Payer: Self-pay | Admitting: *Deleted

## 2018-11-14 NOTE — Telephone Encounter (Signed)
Patient called asking if the other tests that were done are back and where he goes from here, what is next... Please return his call [phone]

## 2018-11-14 NOTE — Telephone Encounter (Signed)
David Phillips. I do not know have any recent test. We have not seen him in several months. Is he calling the correct office?

## 2018-11-14 NOTE — Telephone Encounter (Signed)
He said he saw an [place] doctor who was ordering more tests and he never heard back from those tests

## 2018-11-14 NOTE — Telephone Encounter (Signed)
Patient called back, returning my call.  Patient is unable to remember which drugs he had taken in the past.  Conveyed to me with much profanity that it would be too difficult to find out as it was in a different city.  Will send PA with info that I have.

## 2018-11-14 NOTE — Telephone Encounter (Signed)
He said Dr B was to get results from previous doctors from [place] in [place]. This is a note from his PCP  12:45 PM EST Hi Mr. Varriale,  Gonorrhea and Chlamydia are negative - good news. Hepatitis panel is negative - good news, NEGATIVE for Hep C HIV and Syphilis are negative - good news. Sugar, kidney function, electrolytes, and liver function tests are within normal limits CBC does show ongoing elevated white blood cell count - please follow up with your hematologist as scheduled in April 2020. I also need to strongly recommend that you quit smoking as your hematologist (Dr. Donneta Romberg) felt this would really help these numbers.  Your choleserol levels are still quite poor. You need to make sure that you are takign your atorvastatin every single day, and follow the below recommendations: Some strategies to focus on to help improve your LDL levels:  - Eat 20 to 30 grams of fiber every day.  - Eat Foods such as fruits and vegetables, whole grains, beans, peas, nuts, and seeds can help lower LDL. - Avoid Saturated fats - Dairy foods - such as butter, cream, ghee, regular-fat milk and cheese. Meat - such as fatty cuts of beef, pork and lamb, processed meats like salami, sausages and the skin on chicken. Lard., fatty snack foods, cakes, biscuits, pies and deep fried foods) - Avoid smoking - Foods that increase HDL include beans and legumes, whole grains, high-fiber fruits:prunes, apples, and pears; fatty fish- salmon, tuna, sardines; nuts, olive oil.  Thanks so much, Maurice Small NP-C

## 2018-11-14 NOTE — Telephone Encounter (Signed)
Dr. B did not order any labs on him.

## 2018-11-15 NOTE — Telephone Encounter (Signed)
We do not have any outside records on him. He needs to sign a release of information if he would like our office to see these notes. Dr. Leonard Schwartz - please review notes from pcp and advise. Thanks, [employer]

## 2018-11-19 ENCOUNTER — Telehealth: Payer: Self-pay

## 2018-11-19 ENCOUNTER — Telehealth: Payer: Self-pay | Admitting: Internal Medicine

## 2018-11-19 NOTE — Telephone Encounter (Signed)
Dr B, can you please advise on this patient.

## 2018-11-19 NOTE — Telephone Encounter (Signed)
Pt called requesting a nurse call him back. I read the previous notes on the encounter and he does not need to have release completed for [place] to obtain records for continuity of care. Please call pt as he seems very frustrated that no one is contacting him back.

## 2018-11-19 NOTE — Telephone Encounter (Signed)
Patient left vm stating we need to get prior auth from [facility] for his anti inflammatory that was prescribed.

## 2018-11-20 ENCOUNTER — Inpatient Hospital Stay: Payer: Medicaid Other | Attending: Internal Medicine

## 2018-11-20 ENCOUNTER — Other Ambulatory Visit: Payer: Self-pay | Admitting: Internal Medicine

## 2018-11-20 ENCOUNTER — Other Ambulatory Visit: Payer: Self-pay

## 2018-11-20 DIAGNOSIS — Z7289 Other problems related to lifestyle: Secondary | ICD-10-CM | POA: Insufficient documentation

## 2018-11-20 DIAGNOSIS — D72829 Elevated white blood cell count, unspecified: Secondary | ICD-10-CM | POA: Diagnosis present

## 2018-11-20 DIAGNOSIS — F17210 Nicotine dependence, cigarettes, uncomplicated: Secondary | ICD-10-CM | POA: Diagnosis not present

## 2018-11-20 DIAGNOSIS — D729 Disorder of white blood cells, unspecified: Secondary | ICD-10-CM

## 2018-11-20 DIAGNOSIS — Z8249 Family history of ischemic heart disease and other diseases of the circulatory system: Secondary | ICD-10-CM | POA: Diagnosis not present

## 2018-11-20 DIAGNOSIS — Z818 Family history of other mental and behavioral disorders: Secondary | ICD-10-CM | POA: Diagnosis not present

## 2018-11-20 DIAGNOSIS — Z833 Family history of diabetes mellitus: Secondary | ICD-10-CM | POA: Diagnosis not present

## 2018-11-20 DIAGNOSIS — F1990 Other psychoactive substance use, unspecified, uncomplicated: Secondary | ICD-10-CM | POA: Diagnosis not present

## 2018-11-20 DIAGNOSIS — F259 Schizoaffective disorder, unspecified: Secondary | ICD-10-CM | POA: Insufficient documentation

## 2018-11-20 LAB — CBC WITH DIFFERENTIAL/PLATELET
Abs Immature Granulocytes: 0.08 10*3/uL — ABNORMAL HIGH (ref 0.00–0.07)
Basophils Absolute: 0.2 10*3/uL — ABNORMAL HIGH (ref 0.0–0.1)
Basophils Relative: 1 %
Eosinophils Absolute: 0.5 10*3/uL (ref 0.0–0.5)
Eosinophils Relative: 3 %
HCT: 45.9 % (ref 39.0–52.0)
Hemoglobin: 16.4 g/dL (ref 13.0–17.0)
Immature Granulocytes: 1 %
Lymphocytes Relative: 24 %
Lymphs Abs: 3.7 10*3/uL (ref 0.7–4.0)
MCH: 33.6 pg (ref 26.0–34.0)
MCHC: 35.7 g/dL (ref 30.0–36.0)
MCV: 94.1 fL (ref 80.0–100.0)
Monocytes Absolute: 1.1 10*3/uL — ABNORMAL HIGH (ref 0.1–1.0)
Monocytes Relative: 7 %
Neutro Abs: 10.1 10*3/uL — ABNORMAL HIGH (ref 1.7–7.7)
Neutrophils Relative %: 64 %
Platelets: 297 10*3/uL (ref 150–400)
RBC: 4.88 MIL/uL (ref 4.22–5.81)
RDW: 13.2 % (ref 11.5–15.5)
WBC: 15.5 10*3/uL — ABNORMAL HIGH (ref 4.0–10.5)
nRBC: 0 % (ref 0.0–0.2)

## 2018-11-20 LAB — TECHNOLOGIST SMEAR REVIEW: Tech Review: NORMAL

## 2018-11-20 LAB — LACTATE DEHYDROGENASE: LDH: 150 U/L (ref 98–192)

## 2018-11-20 MED ORDER — DICLOFENAC EPOLAMINE 1.3 % TD PTCH: 1.0000 | MEDICATED_PATCH | Freq: Two times a day (BID) | TRANSDERMAL | 1 refills | Status: DC

## 2018-11-20 NOTE — Progress Notes (Signed)
Colette, please schedule patient for 3 pm. Today for lab only. Also please sch for dox visit next Friday 6/12 at 230 pm.   Spoke with patient. Initially he was very hesitant to come into the clinic for labs. He does not want labs repeated necessarily.  He did not recall any missed calls or voice msgs from Dr. B last evening. Stated that he was up to after 8 pm last night and did not receive any notifications of missed calls. Stated that he was been "dealing with white blood counts for the last 4 years." He does not want any unnecessary calls. Reassured patient that these panels would screen for leukemia and the labs panels are necessary for Dr. Sharmaine Base assessment (since his previously medical records are not available). Patient asked if a bone marrow biopsy is required at this time. I explained to patient that Dr. Jacinto Reap will review these labs first. If any further work up is needed after obtaining these results, Dr. B would review this information with him at the virtual visit. Patient gave verbal understanding. He voiced concerns about needle phobia. Reassurance and active listening provided to patient.  Discussed pre-office covid screening and no visitor restrictions. Patient has not been tested for covid/no symptoms/not been around any one with covid.

## 2018-11-20 NOTE — Telephone Encounter (Signed)
Patient advised of new script, instructed on directions.

## 2018-11-20 NOTE — Progress Notes (Signed)
Left message for pt to call us back.   I would recommend cbc with/ldh/peripheral blood flow cytometry/ bcr-abl- this week. [ordered]  Follow up with me virtual- Dox  in later next week.

## 2018-11-20 NOTE — Telephone Encounter (Signed)
Voltaren was denied by insurance. Would you like to prescribe something else?

## 2018-11-22 LAB — COMP PANEL: LEUKEMIA/LYMPHOMA

## 2018-11-26 ENCOUNTER — Encounter: Payer: Self-pay | Admitting: Internal Medicine

## 2018-11-27 ENCOUNTER — Encounter: Payer: Self-pay | Admitting: Nurse Practitioner

## 2018-11-27 ENCOUNTER — Other Ambulatory Visit: Payer: Self-pay

## 2018-11-27 ENCOUNTER — Ambulatory Visit: Payer: Medicaid Other | Admitting: Nurse Practitioner

## 2018-11-27 VITALS — BP 136/80 | HR 87 | Temp 98.3°F | Resp 14 | Ht 69.0 in | Wt 184.2 lb

## 2018-11-27 DIAGNOSIS — Z113 Encounter for screening for infections with a predominantly sexual mode of transmission: Secondary | ICD-10-CM

## 2018-11-27 NOTE — Patient Instructions (Signed)

## 2018-11-27 NOTE — Progress Notes (Signed)
Name: David Phillips   MRN: [id number]    DOB: 03/20/1986   Date:11/27/2018       Progress Note  Subjective  Chief Complaint  Chief Complaint  Patient presents with  . STD check    HPI Patient had unprotected sex and would like STD testing. Denies penile discharge, pain, or swelling. Patient states he was in a monogamous relationship and was not using condoms and was told by a friend about potential infidelity.  Patient has history of STDs that were treated in the past.   PHQ2/9: [facility] 11/27/2018 08/13/2018 03/19/2018 02/12/2018  Decreased Interest 0 0 0 0  Down, Depressed, Hopeless 0 0 0 0  PHQ - 2 Score 0 0 0 0  Altered sleeping 0 0 0 0  Tired, decreased energy 0 0 0 1  Change in appetite 0 0 0 0  Feeling bad or failure about yourself  0 0 0 0  Trouble concentrating 0 0 0 0  Moving slowly or fidgety/restless 0 0 0 0  Suicidal thoughts 0 0 0 0  PHQ-9 Score 0 0 0 -  Difficult doing work/chores Not difficult at all Not difficult at all - -     PHQ reviewed. Negative  Patient Active Problem List   Diagnosis Date Noted  . Chest pain 05/29/2018  . Hyperlipidemia 03/19/2018  . Chronic left hip pain 02/12/2018  . Chronic left-sided low back pain without sciatica 02/12/2018  . Neutrophilia 02/12/2018  . Schizoaffective disorder, bipolar type (HCC) 02/12/2018  . Encounter for tobacco use cessation counseling 02/12/2018  . Overweight (BMI 25.0-29.9) 02/12/2018  . Allergic rhinitis 03/23/2017  . Hypertension 02/23/2017    Past Medical History:  Diagnosis Date  . Gastric ulcer   . Schizoaffective disorder (HCC)     History reviewed. No pertinent surgical history.  Social History   Tobacco Use  . Smoking status: Current Every Day Smoker    Packs/day: 1.00    Years: 20.00    Pack years: 20.00    Types: Cigarettes    Start date: 581998  . Smokeless tobacco: Former Engineer, waterUser  Substance Use Topics  . Alcohol use: Yes    Alcohol/week: 6.0 standard drinks     Types: 6 Cans of beer per week     Current Outpatient Medications:  .  atorvastatin (LIPITOR) 20 MG tablet, Take 1 tablet (20 mg total) by mouth daily., Disp: 90 tablet, Rfl: 1 .  betamethasone valerate (VALISONE) 0.1 % cream, APP AA ON HANDS D DURING DIFFICULT FLARES, Disp: , Rfl: 3 .  diclofenac (FLECTOR) 1.3 % PTCH, Place 1 patch onto the skin 2 (two) times daily., Disp: 60 patch, Rfl: 1 .  diclofenac (VOLTAREN) 75 MG EC tablet, Take 1 tablet (75 mg total) by mouth 2 (two) times daily. Discontinue and refrain from all NSAIDs while on diclofenac., Disp: 60 tablet, Rfl: 1 .  ELIDEL 1 % cream, APPLY ON THE AFFECTED SKIN D, Disp: , Rfl: 3 .  fluticasone (FLONASE) 50 MCG/ACT nasal spray, Place 2 sprays into both nostrils daily., Disp: 16 g, Rfl: 6 .  guaiFENesin (MUCINEX) 600 MG 12 hr tablet, Take 1 tablet (600 mg total) by mouth 2 (two) times daily., Disp: 20 tablet, Rfl: 0 .  hydrOXYzine (ATARAX/VISTARIL) 50 MG tablet, Take 1 tablet (50 mg total) by mouth 3 (three) times daily as needed. (Patient taking differently: Take 50 mg by mouth every 6 (six) hours as needed. ), Disp: 30 tablet, Rfl: 0 .  losartan (  COZAAR) 25 MG tablet, Take 1 tablet (25 mg total) by mouth daily., Disp: 90 tablet, Rfl: 3 .  Lurasidone HCl (LATUDA) 120 MG TABS, Take 1 tablet by mouth daily., Disp: , Rfl:   Allergies  Allergen Reactions  . Prednisone Nausea And Vomiting    ROS   No other specific complaints in a complete review of systems (except as listed in HPI above).  Objective  Vitals:   11/27/18 1502  BP: 136/80  Pulse: 87  Resp: 14  Temp: 98.3 F (36.8 C)  TempSrc: Oral  SpO2: 99%  Weight: 184 lb 3.2 oz (83.6 kg)  Height: 5\' 9"  (1.753 m)     Body mass index is 27.2 kg/m.  Nursing Note and Vital Signs reviewed.  Physical Exam Constitutional:      Appearance: Normal appearance.  HENT:     Head: Normocephalic and atraumatic.  Cardiovascular:     Rate and Rhythm: Normal rate.  Pulmonary:      Effort: Pulmonary effort is normal.  Skin:    General: Skin is warm and dry.  Neurological:     General: No focal deficit present.     Mental Status: He is alert and oriented to person, place, and time.  Psychiatric:        Mood and Affect: Mood normal.        Behavior: Behavior normal.        No results found for this or any previous visit (from the past 48 hour(s)).  Assessment & Plan  1. Screening for STD (sexually transmitted disease) Instructed on safe sex practices.  - RPR - Hepatitis panel, acute - HIV antibody (with reflex) - GC Probe amplification, urine

## 2018-11-28 ENCOUNTER — Telehealth: Payer: Self-pay

## 2018-11-28 ENCOUNTER — Other Ambulatory Visit: Payer: Self-pay

## 2018-11-28 ENCOUNTER — Other Ambulatory Visit (HOSPITAL_COMMUNITY)
Admission: RE | Admit: 2018-11-28 | Discharge: 2018-11-28 | Disposition: A | Discharge status: Home / Self Care | Payer: Medicaid Other | Source: Ambulatory Visit | Attending: Nurse Practitioner | Admitting: Nurse Practitioner

## 2018-11-28 DIAGNOSIS — Z113 Encounter for screening for infections with a predominantly sexual mode of transmission: Secondary | ICD-10-CM

## 2018-11-28 LAB — BCR-ABL1 FISH
Cells Analyzed: 200
Cells Counted: 200

## 2018-11-28 NOTE — Telephone Encounter (Signed)
-----   Message from Fredderick Severance, NP sent at 11/28/2018 12:57 PM EDT ----- Another hep panel not drawn? idk whats happening here

## 2018-11-28 NOTE — Telephone Encounter (Signed)
Lab is being added.

## 2018-11-29 ENCOUNTER — Telehealth: Payer: Self-pay | Admitting: Internal Medicine

## 2018-11-29 LAB — URINE CYTOLOGY ANCILLARY ONLY
Chlamydia: NEGATIVE
Neisseria Gonorrhea: NEGATIVE

## 2018-11-30 ENCOUNTER — Inpatient Hospital Stay (HOSPITAL_BASED_OUTPATIENT_CLINIC_OR_DEPARTMENT_OTHER): Payer: Medicaid Other | Admitting: Internal Medicine

## 2018-11-30 ENCOUNTER — Encounter: Payer: Self-pay | Admitting: Internal Medicine

## 2018-11-30 DIAGNOSIS — D729 Disorder of white blood cells, unspecified: Secondary | ICD-10-CM | POA: Diagnosis not present

## 2018-11-30 LAB — HIV ANTIBODY (ROUTINE TESTING W REFLEX): HIV 1&2 Ab, 4th Generation: NONREACTIVE

## 2018-11-30 LAB — TEST AUTHORIZATION

## 2018-11-30 LAB — HEPATITIS B SURFACE ANTIGEN: Hepatitis B Surface Ag: NONREACTIVE

## 2018-11-30 LAB — RPR: RPR Ser Ql: NONREACTIVE

## 2018-11-30 NOTE — Progress Notes (Signed)
I connected with [name] on 11/30/18 at  2:30 PM EDT by telephone visit and verified that I am speaking with the correct person using two identifiers.  I discussed the limitations, risks, security and privacy concerns of performing an evaluation and management service by telemedicine and the availability of in-person appointments. I also discussed with the patient that there may be a patient responsible charge related to this service. The patient expressed understanding and agreed to proceed.    Other persons participating in the visit and their role in the encounter: CMA/medical reconciliation Patient's location: Home Provider's location: Office  Oncology History   No history exists.     Chief Complaint: Leukocytosis   History of present illness:David Phillips 33 y.o.  male with history of leukocytosis longstanding-is here today to review the results of the blood work.   Patient denies any recent fevers or chills.  Denies any lumps or bumps.  Complains of chronic joint pains bone pain.  Not any worse.   He had some concerns of recent exposure to STDs.  He is being worked up with his PCP.  Observation/objective:  Assessment and plan: Neutrophilia # MILD leukocytosis/neutrophilia-stable since 2015; white count 13-15/platelets- stable; normal hemoglobin/platelets.  Suspect secondary to smoking/reactive.  Peripheral blood flow cytometry negative for any acute process; BCR able negative.  Would not recommend any further work-up at this time.  # smoking- Discussed with the patient regarding the ill effects of smoking- including but not limited to cardiac lung and vascular diseases and malignancies. Counseled against smoking; patient-not interested in quitting.    #Disposition:  No follow-ups with us/follow-up with PCP  Thank you Ms.[name] NP for allowing me to participate in the care of your pleasant patient. Please do not hesitate to contact me with questions or concerns in  the interim.   Follow-up instructions:  I discussed the assessment and treatment plan with the patient.  The patient was provided an opportunity to ask questions and all were answered.  The patient agreed with the plan and demonstrated understanding of instructions.  The patient was advised to call back or seek an in person evaluation if the symptoms worsen or if the condition fails to improve as anticipated.  I provided 12 minutes of non face-to-face telephone visit time during this encounter, and > 50% was spent counseling as documented under my assessment & plan.   Dr. Charlaine Dalton [facility] at [facility] 11/30/2018 2:22 PM

## 2018-11-30 NOTE — Progress Notes (Signed)
The patient c/o whole body pain ( pain level 9) the patient states he only get a little relief with taking medication but never goes away. The patient name and DOB has been verified by phone today.

## 2018-11-30 NOTE — Assessment & Plan Note (Addendum)
#   MILD leukocytosis/neutrophilia-stable since 2015; white count 13-15/platelets- stable; normal hemoglobin/platelets.  Suspect secondary to smoking/reactive.  Peripheral blood flow cytometry negative for any acute process; BCR able negative.  Would not recommend any further work-up at this time.  # smoking- Discussed with the patient regarding the ill effects of smoking- including but not limited to cardiac lung and vascular diseases and malignancies. Counseled against smoking; patient-not interested in quitting.    #Disposition:  No follow-ups with us/follow-up with PCP  Thank you Ms.[name] NP for allowing me to participate in the care of your pleasant patient. Please do not hesitate to contact me with questions or concerns in the interim.

## 2018-12-04 ENCOUNTER — Encounter: Payer: Self-pay | Admitting: Family Medicine

## 2019-01-30 ENCOUNTER — Encounter: Payer: Self-pay | Admitting: Family Medicine

## 2019-02-04 ENCOUNTER — Ambulatory Visit
Admission: RE | Admit: 2019-02-04 | Discharge: 2019-02-04 | Disposition: A | Discharge status: Home / Self Care | Payer: Medicaid Other | Source: Ambulatory Visit | Attending: Student in an Organized Health Care Education/Training Program | Admitting: Student in an Organized Health Care Education/Training Program

## 2019-02-04 ENCOUNTER — Other Ambulatory Visit: Payer: Self-pay

## 2019-02-04 DIAGNOSIS — M25512 Pain in left shoulder: Secondary | ICD-10-CM | POA: Diagnosis present

## 2019-02-04 DIAGNOSIS — M25562 Pain in left knee: Secondary | ICD-10-CM | POA: Diagnosis present

## 2019-02-04 DIAGNOSIS — M545 Low back pain: Secondary | ICD-10-CM | POA: Insufficient documentation

## 2019-02-04 DIAGNOSIS — M25511 Pain in right shoulder: Secondary | ICD-10-CM | POA: Insufficient documentation

## 2019-02-04 DIAGNOSIS — M25561 Pain in right knee: Secondary | ICD-10-CM | POA: Diagnosis present

## 2019-02-04 DIAGNOSIS — G8929 Other chronic pain: Secondary | ICD-10-CM

## 2019-02-04 DIAGNOSIS — M25552 Pain in left hip: Secondary | ICD-10-CM | POA: Diagnosis present

## 2019-02-08 ENCOUNTER — Encounter: Payer: Self-pay | Admitting: Student in an Organized Health Care Education/Training Program

## 2019-02-18 ENCOUNTER — Telehealth: Payer: Self-pay | Admitting: *Deleted

## 2019-02-18 NOTE — Telephone Encounter (Signed)
Spoke with patient regarding his discharge form the clinic. Informed him Dr. Holley Raring had not taken him on as a patient and was not planning to prescribe medication form the note in his chart. He discussed the denial of anti inflammatory from his insurance and all the copays he has to pay. Informed him he could request thes medications form PCP. He asked could they refer him somewhere else. I told him he could definitely get a referral but Dr. Holley Raring was jot going to take over his medications. Verbalizes understanding.

## 2019-03-06 ENCOUNTER — Other Ambulatory Visit: Payer: Self-pay

## 2019-03-06 ENCOUNTER — Emergency Department
Admission: EM | Admit: 2019-03-06 | Discharge: 2019-03-06 | Disposition: A | Discharge status: Home / Self Care | Payer: Medicaid Other | Attending: Emergency Medicine | Admitting: Emergency Medicine

## 2019-03-06 ENCOUNTER — Encounter: Payer: Self-pay | Admitting: Emergency Medicine

## 2019-03-06 DIAGNOSIS — L03113 Cellulitis of right upper limb: Secondary | ICD-10-CM | POA: Diagnosis not present

## 2019-03-06 DIAGNOSIS — Z888 Allergy status to other drugs, medicaments and biological substances status: Secondary | ICD-10-CM | POA: Diagnosis not present

## 2019-03-06 DIAGNOSIS — F17210 Nicotine dependence, cigarettes, uncomplicated: Secondary | ICD-10-CM | POA: Diagnosis not present

## 2019-03-06 DIAGNOSIS — L0390 Cellulitis, unspecified: Secondary | ICD-10-CM

## 2019-03-06 DIAGNOSIS — I10 Essential (primary) hypertension: Secondary | ICD-10-CM | POA: Diagnosis not present

## 2019-03-06 DIAGNOSIS — R21 Rash and other nonspecific skin eruption: Secondary | ICD-10-CM | POA: Diagnosis present

## 2019-03-06 MED ORDER — CEPHALEXIN 500 MG PO CAPS: 500.0000 mg | ORAL_CAPSULE | Freq: Two times a day (BID) | ORAL | 0 refills | Status: DC

## 2019-03-06 NOTE — ED Triage Notes (Signed)
Patient ambulatory to triage with steady gait, without difficulty or distress noted, mask in place; pt reports "I thnk I got bit my a brown recluse spider"; pt indicates small reddened area to inside of rt wrist; denies seeing a spider, just noticed area today

## 2019-03-06 NOTE — ED Notes (Signed)
Patient updated on wait time 

## 2019-03-06 NOTE — ED Provider Notes (Signed)
[name] Emergency Department Provider Note  Time seen: 3:06 AM  I have reviewed the triage vital signs and the nursing notes.   HISTORY  Chief Complaint Insect Bite   HPI David Phillips is a 33 y.o. male with a past medical history of schizoaffective disorder presents to the emergency department for a red itchy area to his right wrist.   According to the patient he has noticed a lot of itchiness to his right wrist, tonight noticed that there was a red bump to the wrist.  Patient was concerned this could have been a spider bite such as a brown recluse so he came to the emergency department for evaluation.  Patient did not see any spider.  Denies any fever.  Denies any cough congestion or shortness of breath.  Past Medical History:  Diagnosis Date  . Gastric ulcer   . Schizoaffective disorder [facility](HCC)     Patient Active Problem List   Diagnosis Date Noted  . Chest pain 05/29/2018  . Hyperlipidemia 03/19/2018  . Chronic left hip pain 02/12/2018  . Chronic left-sided low back pain without sciatica 02/12/2018  . Neutrophilia 02/12/2018  . Schizoaffective disorder, bipolar type (HCC) 02/12/2018  . Encounter for tobacco use cessation counseling 02/12/2018  . Overweight (BMI 25.0-29.9) 02/12/2018  . Allergic rhinitis 03/23/2017  . Hypertension 02/23/2017    History reviewed. No pertinent surgical history.  Prior to Admission medications   Medication Sig Start Date End Date Taking? Authorizing Provider  atorvastatin (LIPITOR) 20 MG tablet Take 1 tablet (20 mg total) by mouth daily. 03/19/18   Doren CustardBoyce, Emily E, FNP  betamethasone valerate (VALISONE) 0.1 % cream APP AA ON HANDS D DURING DIFFICULT FLARES 03/01/18   [provider]  diclofenac (FLECTOR) 1.3 % PTCH Place 1 patch onto the skin 2 (two) times daily. 11/20/18   [facility] JollyLateef, Bilal, MD  diclofenac (VOLTAREN) 75 MG EC tablet Take 1 tablet (75 mg total) by mouth 2 (two) times daily. Discontinue and refrain  from all NSAIDs while on diclofenac. 10/10/18   [facility] JollyLateef, Bilal, MD  ELIDEL 1 % cream APPLY ON THE AFFECTED SKIN D 02/28/18   [provider]  fluticasone (FLONASE) 50 MCG/ACT nasal spray Place 2 sprays into both nostrils daily. 08/13/18   Doren CustardBoyce, Emily E, FNP  guaiFENesin (MUCINEX) 600 MG 12 hr tablet Take 1 tablet (600 mg total) by mouth 2 (two) times daily. 08/13/18   Doren CustardBoyce, Emily E, FNP  hydrOXYzine (ATARAX/VISTARIL) 50 MG tablet Take 1 tablet (50 mg total) by mouth 3 (three) times daily as needed. Patient taking differently: Take 50 mg by mouth every 6 (six) hours as needed.  12/02/17   Joni ReiningSmith, Ronald K, PA-C  losartan (COZAAR) 25 MG tablet Take 1 tablet (25 mg total) by mouth daily. 05/30/18   Antonieta IbaGollan, Timothy J, MD  Lurasidone HCl (LATUDA) 120 MG TABS Take 1 tablet by mouth daily.    [provider]    Allergies  Allergen Reactions  . Prednisone Nausea And Vomiting    Family History  Problem Relation Age of Onset  . Diabetes Mother   . Diabetes Father   . Heart attack Father [id number]       Bypass surgeries; 3 MI's  . Schizophrenia Other   . Heart attack Paternal Grandfather [id number]  . Other Neg Hx     Social History Social History   Tobacco Use  . Smoking status: Current Every Day Smoker    Packs/day: 1.00    Years: 20.00  Pack years: 20.00    Types: Cigarettes    Start date: 68  . Smokeless tobacco: Former Network engineer Use Topics  . Alcohol use: Yes    Alcohol/week: 6.0 standard drinks    Types: 6 Cans of beer per week  . Drug use: Yes    Comment: h/o drug use in the past.    Review of Systems Constitutional: Negative for fever. Cardiovascular: Negative for chest pain. Respiratory: Negative for shortness of breath. Gastrointestinal: Negative for abdominal pain Musculoskeletal: Bump and redness to right wrist Skin: Redness and bump to right wrist. Neurological: Negative for headache All other ROS  negative  ____________________________________________   PHYSICAL EXAM:  VITAL SIGNS: ED Triage Vitals [03/06/19 0130]  Enc Vitals Group     BP (!) 170/100     Pulse Rate 79     Resp 20     Temp 98 F (36.7 C)     Temp Source Oral     SpO2 99 %     Weight 180 lb (81.6 kg)     Height 5\' 9"  (1.753 m)     Head Circumference      Peak Flow      Pain Score      Pain Loc      Pain Edu?      Excl. in [place]?    Constitutional: Alert . Well appearing and in no distress. Eyes: Normal exam ENT      Head: Normocephalic and atraumatic.      Mouth/Throat: Mucous membranes are moist. Cardiovascular: Normal rate, regular rhythm.  Respiratory: Normal respiratory effort without tachypnea nor retractions. Breath sounds are clear Gastrointestinal: Soft and nontender. No distention Musculoskeletal: Patient has a small approximately 1 cm bump to his palmar aspect of the right wrist.  Mild erythema.  No abscess. Neurologic:  Normal speech and language. No gross focal neurologic deficits Skin:  Skin is warm.  Small area of erythema as noted above to the right wrist Psychiatric: Mood and affect are normal.   ____________________________________________   INITIAL IMPRESSION / ASSESSMENT AND PLAN / ED COURSE  Pertinent labs & imaging results that were available during my care of the patient were reviewed by me and considered in my medical decision making (see chart for details).   Patient has a small inflamed area to the right wrist with erythema.  Possibly representing a small area of cellulitis.  No sign of abscess.  We will cover with a short course of Keflex.  Patient agreeable to plan of care.  David Phillips was evaluated in Emergency Department on 03/06/2019 for the symptoms described in the history of present illness. He was evaluated in the context of the global COVID-19 pandemic, which necessitated consideration that the patient might be at risk for infection with the SARS-CoV-2 virus that  causes COVID-19. Institutional protocols and algorithms that pertain to the evaluation of patients at risk for COVID-19 are in a state of rapid change based on information released by regulatory bodies including the CDC and federal and state organizations. These policies and algorithms were followed during the patient's care in the ED.  ____________________________________________   FINAL CLINICAL IMPRESSION(S) / ED DIAGNOSES  Cellulitis   Harvest Dark, MD 03/06/19 [phone]

## 2019-03-13 ENCOUNTER — Other Ambulatory Visit: Payer: Self-pay | Admitting: Family Medicine

## 2019-03-13 DIAGNOSIS — E782 Mixed hyperlipidemia: Secondary | ICD-10-CM

## 2019-03-14 NOTE — Telephone Encounter (Signed)
Requested medication (s) are due for refill today: yes  Requested medication (s) are on the active medication list: yes  Last refill:  01/31/2019  Future visit scheduled: no  Notes to clinic:  Review for refill  Requested Prescriptions  Pending Prescriptions Disp Refills   atorvastatin (LIPITOR) 20 MG tablet [Pharmacy Med Name: ATORVASTATIN 20MG  TABLETS] 90 tablet 1    Sig: TAKE 1 TABLET(20 MG) BY MOUTH DAILY     Cardiovascular:  Antilipid - Statins Failed - 03/13/2019  8:31 PM      Failed - Total Cholesterol in normal range and within 360 days    Cholesterol, Total  Date Value Ref Range Status  02/23/2017 191 100 - 199 mg/dL Final   Cholesterol  Date Value Ref Range Status  08/13/2018 220 (H) <200 mg/dL Final         Failed - LDL in normal range and within 360 days    LDL Cholesterol (Calc)  Date Value Ref Range Status  08/13/2018 134 (H) mg/dL (calc) Final    Comment:    Reference range: <100 . Desirable range <100 mg/dL for primary prevention;   <70 mg/dL for patients with CHD or diabetic patients  with > or = 2 CHD risk factors. Marland Kitchen LDL-C is now calculated using the Martin-Hopkins  calculation, which is a validated novel method providing  better accuracy than the Friedewald equation in the  estimation of LDL-C.  Cresenciano Genre et al. Annamaria Helling. 1275;170(01): 2061-2068  (http://education.[employer].com/faq/FAQ164)          Failed - Triglycerides in normal range and within 360 days    Triglycerides  Date Value Ref Range Status  08/13/2018 292 (H) <150 mg/dL Final    Comment:    . If a non-fasting specimen was collected, consider repeat triglyceride testing on a fasting specimen if clinically indicated.  Yates Decamp et al. J. of Clin. Lipidol. 7494;4:967-591. Marland Kitchen          Passed - HDL in normal range and within 360 days    HDL  Date Value Ref Range Status  08/13/2018 41 > OR = 40 mg/dL Final  02/23/2017 36 (L) >39 mg/dL Final         Passed - Patient is not  pregnant      Passed - Valid encounter within last 12 months    Recent Outpatient Visits          3 months ago Screening for STD (sexually transmitted disease)   [place], NP   7 months ago Viral upper respiratory tract infection   [place], [place], FNP   12 months ago Annual physical exam   [place], FNP   1 year ago Leukocytosis, unspecified type   [place], [place]

## 2019-03-31 ENCOUNTER — Other Ambulatory Visit: Payer: Self-pay | Admitting: Student in an Organized Health Care Education/Training Program

## 2019-05-03 ENCOUNTER — Other Ambulatory Visit: Payer: Self-pay | Admitting: Family Medicine

## 2019-05-09 ENCOUNTER — Other Ambulatory Visit: Payer: Self-pay

## 2019-05-09 ENCOUNTER — Encounter: Payer: Self-pay | Admitting: Family Medicine

## 2019-05-09 ENCOUNTER — Other Ambulatory Visit (HOSPITAL_COMMUNITY)
Admission: RE | Admit: 2019-05-09 | Discharge: 2019-05-09 | Disposition: A | Discharge status: Home / Self Care | Payer: Medicare (Managed Care) | Source: Ambulatory Visit | Attending: Family Medicine | Admitting: Family Medicine

## 2019-05-09 ENCOUNTER — Ambulatory Visit (INDEPENDENT_AMBULATORY_CARE_PROVIDER_SITE_OTHER): Payer: Medicare (Managed Care) | Admitting: Family Medicine

## 2019-05-09 VITALS — BP 120/76 | HR 90 | Temp 97.8°F | Resp 14 | Ht 69.0 in | Wt 176.3 lb

## 2019-05-09 DIAGNOSIS — E782 Mixed hyperlipidemia: Secondary | ICD-10-CM | POA: Diagnosis not present

## 2019-05-09 DIAGNOSIS — Z23 Encounter for immunization: Secondary | ICD-10-CM | POA: Diagnosis not present

## 2019-05-09 DIAGNOSIS — Z716 Tobacco abuse counseling: Secondary | ICD-10-CM

## 2019-05-09 DIAGNOSIS — M545 Low back pain, unspecified: Secondary | ICD-10-CM

## 2019-05-09 DIAGNOSIS — F250 Schizoaffective disorder, bipolar type: Secondary | ICD-10-CM | POA: Diagnosis not present

## 2019-05-09 DIAGNOSIS — I10 Essential (primary) hypertension: Secondary | ICD-10-CM | POA: Diagnosis not present

## 2019-05-09 DIAGNOSIS — Z113 Encounter for screening for infections with a predominantly sexual mode of transmission: Secondary | ICD-10-CM | POA: Diagnosis present

## 2019-05-09 DIAGNOSIS — R0789 Other chest pain: Secondary | ICD-10-CM

## 2019-05-09 DIAGNOSIS — D729 Disorder of white blood cells, unspecified: Secondary | ICD-10-CM

## 2019-05-09 DIAGNOSIS — M25552 Pain in left hip: Secondary | ICD-10-CM

## 2019-05-09 DIAGNOSIS — G8929 Other chronic pain: Secondary | ICD-10-CM

## 2019-05-09 DIAGNOSIS — R1013 Epigastric pain: Secondary | ICD-10-CM

## 2019-05-09 MED ORDER — MELOXICAM 15 MG PO TABS: 15.0000 mg | ORAL_TABLET | Freq: Every day | ORAL | 0 refills | Status: DC

## 2019-05-09 NOTE — Patient Instructions (Addendum)
As your psychiatrist about Wellbutrin for smoking cessation.

## 2019-05-09 NOTE — Progress Notes (Signed)
Name: David Phillips   MRN: [id number]    DOB: Apr 12, 1986   Date:05/09/2019       Progress Note  Subjective  Chief Complaint  Chief Complaint  Patient presents with  . Hyperlipidemia  . Hypertension  . Medication Refill    anti inflammatory    HPI  Tobacco Abuse: Has tried gum, patch, mints without success; chantix made him too depressed.  Discussed treatment in detail.  He is smoking 1ppd at this time - much less than his 2-2.5ppd that he was up to at his peak.  Started smoking at age 20yo.  Schizoaffective Disorder: He is seeing psychiatry at this time - taking latuda and hydroxyzine.  Doing well on this.  States he has had auditory and visual hallucinations and this is under good control with his medications.   He does have some increased sexual activity - does use condoms, but has new partners regularly.  We will provide STI screening today  HTN: Saw Dr. Rockey Situ due to chest pain and abnormal EKG in the past - was told to follow up PRN.  He is taking Losartan 25mg  and doing well on this.  He denies chest pain, shortness of breath, or BLE edema.    Epigastric Discomfort:  He notes epigastric pain that is described as sore, it is daily and is constant.  Denies any exacerbating or relieving factors.  Is chronic smoker and coughs a lot in the morning.  He requests Xray today - we will provide.  Consider Anoro for possible COPD vs acid reducing therapy depending on CXR results.  Hip pain/back pain/neck pain: Was seeing chiropractor for his neck pain.  Was then sent to Dr. Holley Raring, and he was discharged from their clinic after using profanity during a phone call and sending an inappropriate MyChart message.  Discussed this behavior in detail.  Did recommend PT but he states that is too expensive and time consuming.  Has history gastric ulcer, but no issues recently; we will trial Meloxicam as diclofenac is not covered and is too expensive.  HLD: Has been compliant with atorvastatin;  avoiding pork products. Denies chest pain or shortness of breath.  Does try to walk inside as much as possible.  Neutrophilia: Determined to be mild by Dr. Rogue Bussing.  Last visit June 2020, told to follow up PRN.  Patient Active Problem List   Diagnosis Date Noted  . Chest pain 05/29/2018  . Hyperlipidemia 03/19/2018  . Chronic left hip pain 02/12/2018  . Chronic left-sided low back pain without sciatica 02/12/2018  . Neutrophilia 02/12/2018  . Schizoaffective disorder, bipolar type ([place]) 02/12/2018  . Encounter for tobacco use cessation counseling 02/12/2018  . Overweight (BMI 25.0-29.9) 02/12/2018  . Allergic rhinitis 03/23/2017  . Hypertension 02/23/2017    No past surgical history on file.  Family History  Problem Relation Age of Onset  . Diabetes Mother   . Diabetes Father   . Heart attack Father 50       Bypass surgeries; 3 MI's  . Schizophrenia Other   . Heart attack Paternal Grandfather 43  . Other Neg Hx     Social History   Socioeconomic History  . Marital status: Single    Spouse name: Not on file  . Number of children: 0  . Years of education: Not on file  . Highest education level: Not on file  Occupational History  . Occupation: looking for a Lobbyist job  . Occupation: disability  Social Needs  . Emergency planning/management officer  strain: Somewhat hard  . Food insecurity    Worry: Sometimes true    Inability: Sometimes true  . Transportation needs    Medical: Yes    Non-medical: Yes  Tobacco Use  . Smoking status: Current Every Day Smoker    Packs/day: 1.00    Years: 20.00    Pack years: 20.00    Types: Cigarettes    Start date: 56  . Smokeless tobacco: Former Engineer, water and Sexual Activity  . Alcohol use: Yes    Alcohol/week: 6.0 standard drinks    Types: 6 Cans of beer per week  . Drug use: Yes    Comment: h/o drug use in the past.  . Sexual activity: Not Currently  Lifestyle  . Physical activity    Days per week: 0 days    Minutes per  session: 0 min  . Stress: Rather much  Relationships  . Social Musician on phone: Not on file    Gets together: Not on file    Attends religious service: Not on file    Active member of club or organization: Not on file    Attends meetings of clubs or organizations: Not on file    Relationship status: Not on file  . Intimate partner violence    Fear of current or ex partner: Not on file    Emotionally abused: Not on file    Physically abused: Not on file    Forced sexual activity: Not on file  Other Topics Concern  . Not on file  Social History Narrative  . Not on file     Current Outpatient Medications:  .  atorvastatin (LIPITOR) 20 MG tablet, TAKE 1 TABLET(20 MG) BY MOUTH DAILY, Disp: 90 tablet, Rfl: 1 .  betamethasone valerate (VALISONE) 0.1 % cream, APP AA ON HANDS D DURING DIFFICULT FLARES, Disp: , Rfl: 3 .  diclofenac (VOLTAREN) 75 MG EC tablet, Take 1 tablet (75 mg total) by mouth 2 (two) times daily. Discontinue and refrain from all NSAIDs while on diclofenac., Disp: 60 tablet, Rfl: 1 .  ELIDEL 1 % cream, APPLY ON THE AFFECTED SKIN D, Disp: , Rfl: 3 .  fluticasone (FLONASE) 50 MCG/ACT nasal spray, Place 2 sprays into both nostrils daily., Disp: 16 g, Rfl: 6 .  hydrOXYzine (ATARAX/VISTARIL) 50 MG tablet, Take 1 tablet (50 mg total) by mouth 3 (three) times daily as needed. (Patient taking differently: Take 50 mg by mouth every 6 (six) hours as needed. ), Disp: 30 tablet, Rfl: 0 .  losartan (COZAAR) 25 MG tablet, Take 1 tablet (25 mg total) by mouth daily., Disp: 90 tablet, Rfl: 3 .  Lurasidone HCl (LATUDA) 120 MG TABS, Take 1 tablet by mouth daily., Disp: , Rfl:  .  cephALEXin (KEFLEX) 500 MG capsule, Take 1 capsule (500 mg total) by mouth 2 (two) times daily. (Patient not taking: Reported on 05/09/2019), Disp: 10 capsule, Rfl: 0 .  diclofenac (FLECTOR) 1.3 % PTCH, Place 1 patch onto the skin 2 (two) times daily. (Patient not taking: Reported on 05/09/2019), Disp:  60 patch, Rfl: 1 .  guaiFENesin (MUCINEX) 600 MG 12 hr tablet, Take 1 tablet (600 mg total) by mouth 2 (two) times daily. (Patient not taking: Reported on 05/09/2019), Disp: 20 tablet, Rfl: 0  Allergies  Allergen Reactions  . Prednisone Nausea And Vomiting    I personally reviewed active problem list, medication list, allergies, notes from last encounter, lab results with the patient/caregiver today.  ROS  Ten systems reviewed and is negative except as mentioned in HPI  Objective  Vitals:   05/09/19 1331  BP: 120/76  Pulse: 90  Resp: 14  Temp: 97.8 F (36.6 C)  TempSrc: Temporal  SpO2: 99%  Weight: 176 lb 4.8 oz (80 kg)  Height: 5\' 9"  (1.753 m)    Body mass index is 26.03 kg/m.  Physical Exam  Constitutional: Patient appears well-developed and well-nourished. No distress.  HENT: Head: Normocephalic and atraumatic.  Eyes: Conjunctivae and EOM are normal. No scleral icterus.   Neck: Normal range of motion. Neck supple. No JVD present. No thyromegaly present.  Cardiovascular: Normal rate, regular rhythm and normal heart sounds.  No murmur heard. No BLE edema. Pulmonary/Chest: Effort normal and breath sounds normal. No respiratory distress. Abdominal: Soft. Bowel sounds are normal, no distension. There is reproducible tenderness at the epigastric region. No masses. Musculoskeletal: Normal range of motion, no joint effusions. No gross deformities Neurological: Pt is alert and oriented to person, place, and time. No cranial nerve deficit. Coordination, balance, strength, speech and gait are normal.  Skin: Skin is warm and dry. No rash noted. No erythema.  Psychiatric: Patient has a normal mood and affect. behavior is normal. Judgment and thought content normal.  No results found for this or any previous visit (from the past 72 hour(s)).  PHQ2/9: Depression screen [facility] 2/9 05/09/2019 11/27/2018 08/13/2018 03/19/2018 02/12/2018  Decreased Interest 0 0 0 0 0  Down, Depressed,  Hopeless 0 0 0 0 0  PHQ - 2 Score 0 0 0 0 0  Altered sleeping 0 0 0 0 0  Tired, decreased energy 0 0 0 0 1  Change in appetite 0 0 0 0 0  Feeling bad or failure about yourself  0 0 0 0 0  Trouble concentrating 0 0 0 0 0  Moving slowly or fidgety/restless 0 0 0 0 0  Suicidal thoughts 0 0 0 0 0  PHQ-9 Score 0 0 0 0 -  Difficult doing work/chores Not difficult at all Not difficult at all Not difficult at all - -   PHQ-2/9 Result is negative.    Fall Risk: Fall Risk  05/09/2019 11/27/2018 03/19/2018 02/12/2018  Falls in the past year? 0 0 No No  Number falls in past yr: 0 - - -  Injury with Fall? 0 - - -  Follow up Falls evaluation completed - - -   Assessment & Plan 1. Encounter for tobacco use cessation counseling - Counseling provided; would like to quit but has not bee successful, working with psychaitry  2. Schizoaffective disorder, bipolar type (HCC) - Seeing psychiatry  3. Essential hypertension - Stable and at goal today - COMPLETE METABOLIC PANEL WITH GFR  4. Mixed hyperlipidemia - Lipid panel  5. Epigastric pain - ?GERD vs MSK issue, position and description, along with reproducibility of the pain with palpation cause very low suspicion for ACS - DG Chest 2 View; Future  6. Xiphoid pain - DG Chest 2 View; Future  7. Chronic left-sided low back pain without sciatica - meloxicam (MOBIC) 15 MG tablet; Take 1 tablet (15 mg total) by mouth daily.  Dispense: 90 tablet; Refill: 0  8. Chronic left hip pain - meloxicam (MOBIC) 15 MG tablet; Take 1 tablet (15 mg total) by mouth daily.  Dispense: 90 tablet; Refill: 0  9. Neutrophilia - CBC with Differential/Platelet  10. Routine screening for STI (sexually transmitted infection) - RPR - HIV Antibody (routine testing w rflx) -  Urine cytology ancillary only  11. Need for influenza vaccination - Flu Vaccine QUAD 6+ mos PF IM (Fluarix Quad PF)  11. Routine screening for STI (sexually transmitted infection) - RPR - HIV  Antibody (routine testing w rflx) - Urine cytology ancillary only

## 2019-05-10 LAB — CBC WITH DIFFERENTIAL/PLATELET
Absolute Monocytes: 1323 cells/uL — ABNORMAL HIGH (ref 200–950)
Basophils Absolute: 135 cells/uL (ref 0–200)
Basophils Relative: 1 %
Eosinophils Absolute: 473 cells/uL (ref 15–500)
Eosinophils Relative: 3.5 %
HCT: 48.6 % (ref 38.5–50.0)
Hemoglobin: 16.8 g/dL (ref 13.2–17.1)
Lymphs Abs: 3456 cells/uL (ref 850–3900)
MCH: 33.7 pg — ABNORMAL HIGH (ref 27.0–33.0)
MCHC: 34.6 g/dL (ref 32.0–36.0)
MCV: 97.4 fL (ref 80.0–100.0)
MPV: 10.4 fL (ref 7.5–12.5)
Monocytes Relative: 9.8 %
Neutro Abs: 8114 cells/uL — ABNORMAL HIGH (ref 1500–7800)
Neutrophils Relative %: 60.1 %
Platelets: 324 10*3/uL (ref 140–400)
RBC: 4.99 10*6/uL (ref 4.20–5.80)
RDW: 12.9 % (ref 11.0–15.0)
Total Lymphocyte: 25.6 %
WBC: 13.5 10*3/uL — ABNORMAL HIGH (ref 3.8–10.8)

## 2019-05-10 LAB — COMPLETE METABOLIC PANEL WITH GFR
AG Ratio: 2.2 (calc) (ref 1.0–2.5)
ALT: 46 U/L (ref 9–46)
AST: 29 U/L (ref 10–40)
Albumin: 4.8 g/dL (ref 3.6–5.1)
Alkaline phosphatase (APISO): 83 U/L (ref 36–130)
BUN: 13 mg/dL (ref 7–25)
CO2: 26 mmol/L (ref 20–32)
Calcium: 9.6 mg/dL (ref 8.6–10.3)
Chloride: 100 mmol/L (ref 98–110)
Creat: 1.13 mg/dL (ref 0.60–1.35)
GFR, Est African American: 98 mL/min/{1.73_m2} (ref >=60)
GFR, Est Non African American: 84 mL/min/{1.73_m2} (ref >=60)
Globulin: 2.2 g/dL (calc) (ref 1.9–3.7)
Glucose, Bld: 64 mg/dL — ABNORMAL LOW (ref 65–99)
Potassium: 4.4 mmol/L (ref 3.5–5.3)
Sodium: 138 mmol/L (ref 135–146)
Total Bilirubin: 0.5 mg/dL (ref 0.2–1.2)
Total Protein: 7 g/dL (ref 6.1–8.1)

## 2019-05-10 LAB — LIPID PANEL
Cholesterol: 176 mg/dL (ref <200)
HDL: 45 mg/dL (ref >=40)
LDL Cholesterol (Calc): 90 mg/dL (calc)
Non-HDL Cholesterol (Calc): 131 mg/dL (calc) — ABNORMAL HIGH (ref <130)
Total CHOL/HDL Ratio: 3.9 (calc) (ref <5.0)
Triglycerides: 284 mg/dL — ABNORMAL HIGH (ref <150)

## 2019-05-10 LAB — RPR: RPR Ser Ql: NONREACTIVE

## 2019-05-10 LAB — HIV ANTIBODY (ROUTINE TESTING W REFLEX): HIV 1&2 Ab, 4th Generation: NONREACTIVE

## 2019-05-13 LAB — URINE CYTOLOGY ANCILLARY ONLY
Chlamydia: NEGATIVE
Comment: NEGATIVE
Comment: NORMAL
Neisseria Gonorrhea: NEGATIVE

## 2019-05-22 ENCOUNTER — Encounter: Payer: Self-pay | Admitting: Family Medicine

## 2019-05-30 ENCOUNTER — Other Ambulatory Visit: Payer: Self-pay | Admitting: Cardiovascular Disease

## 2019-06-25 ENCOUNTER — Ambulatory Visit
Admission: RE | Admit: 2019-06-25 | Discharge: 2019-06-25 | Disposition: A | Discharge status: Home / Self Care | Payer: Medicare Other | Source: Ambulatory Visit | Attending: Family Medicine | Admitting: Family Medicine

## 2019-06-25 ENCOUNTER — Ambulatory Visit
Admission: RE | Admit: 2019-06-25 | Discharge: 2019-06-25 | Disposition: A | Discharge status: Home / Self Care | Payer: Medicare Other | Attending: Family Medicine | Admitting: Family Medicine

## 2019-06-25 ENCOUNTER — Other Ambulatory Visit: Payer: Self-pay

## 2019-06-25 DIAGNOSIS — R1013 Epigastric pain: Secondary | ICD-10-CM | POA: Insufficient documentation

## 2019-06-25 DIAGNOSIS — R0789 Other chest pain: Secondary | ICD-10-CM | POA: Diagnosis present

## 2019-06-26 ENCOUNTER — Other Ambulatory Visit: Payer: Self-pay | Admitting: Family Medicine

## 2019-06-26 DIAGNOSIS — R9389 Abnormal findings on diagnostic imaging of other specified body structures: Secondary | ICD-10-CM

## 2019-06-27 ENCOUNTER — Encounter: Payer: Self-pay | Admitting: Family Medicine

## 2019-06-30 ENCOUNTER — Other Ambulatory Visit: Payer: Self-pay | Admitting: Cardiovascular Disease

## 2019-07-08 ENCOUNTER — Encounter: Payer: Self-pay | Admitting: Family Medicine

## 2019-07-08 ENCOUNTER — Other Ambulatory Visit: Payer: Self-pay

## 2019-07-08 ENCOUNTER — Ambulatory Visit (INDEPENDENT_AMBULATORY_CARE_PROVIDER_SITE_OTHER): Payer: Medicare Other | Admitting: Family Medicine

## 2019-07-08 DIAGNOSIS — R9389 Abnormal findings on diagnostic imaging of other specified body structures: Secondary | ICD-10-CM

## 2019-07-08 DIAGNOSIS — Z7189 Other specified counseling: Secondary | ICD-10-CM | POA: Diagnosis not present

## 2019-07-08 DIAGNOSIS — J209 Acute bronchitis, unspecified: Secondary | ICD-10-CM

## 2019-07-08 MED ORDER — BENZONATATE 100 MG PO CAPS: 100.0000 mg | ORAL_CAPSULE | Freq: Three times a day (TID) | ORAL | 0 refills | Status: DC | PRN

## 2019-07-08 MED ORDER — ALBUTEROL SULFATE HFA 108 (90 BASE) MCG/ACT IN AERS: 2.0000 | INHALATION_SPRAY | RESPIRATORY_TRACT | 1 refills | Status: DC | PRN

## 2019-07-08 MED ORDER — GUAIFENESIN ER 600 MG PO TB12: 600.0000 mg | ORAL_TABLET | Freq: Two times a day (BID) | ORAL | 0 refills | Status: AC

## 2019-07-08 MED ORDER — DEXAMETHASONE 6 MG PO TABS: 6.0000 mg | ORAL_TABLET | Freq: Every day | ORAL | 0 refills | Status: DC

## 2019-07-08 MED ORDER — DOXYCYCLINE HYCLATE 100 MG PO CAPS: 100.0000 mg | ORAL_CAPSULE | Freq: Two times a day (BID) | ORAL | 0 refills | Status: DC

## 2019-07-08 NOTE — Progress Notes (Signed)
Name: David Phillips   MRN: [id number]    DOB: [birth year]   Date:07/08/2019       Progress Note  Subjective:    Chief Complaint  Chief Complaint  Patient presents with  . Follow-up    explain xray in more detail  . Medication Refill  . Hyperlipidemia    I connected with  Ella Bodo  on 07/08/19 at 11:20 AM EST by a video enabled telemedicine application and verified that I am speaking with the correct person using two identifiers.  I discussed the limitations of evaluation and management by telemedicine and the availability of in person appointments. The patient expressed understanding and agreed to proceed. Staff also discussed with the patient that there may be a patient responsible charge related to this service. Patient Location: home Provider Location: [facility] clinic Additional Individuals present: none  HPI  Pt presents via telephone for questions regarding CXR report. He was seen only by his PCP in May 09, 2019  He complained of CP and coughing and she ordered a CXR but it was not done Pt smoked 2+ ppd for most of his life, so that's his concern.   Pt has some pain to his left lower lung, notices when he takes a deep breath, but also can feel it just here and there. Some mild SOB, breathing more labored with activity, chronic cough is slightly worse, cough is productive sputum brown sometimes darker spots in it No fever, chills, sweats He has lost some weight over the past 6-8 months Was 184 now 176   Exposed to close family member- step dad who had COVID and pneumonia on 12/26, but he was only at their house for  a few minutes and he doesn't even know when step dad was sick or diagnosed, he was only in close contact with his mom for a few minutes she had no sx.     Reviewed with pt today Xray findings: CLINICAL DATA:  34 year old male with history of mid chest pain/tightness for the past 2-3 months. Cough.  EXAM: CHEST - 2 VIEW  COMPARISON:   None.  FINDINGS: Interstitial prominence throughout the lungs bilaterally with ill-defined nodular density projecting over the left lower lung, apparently within the lingula on the lateral projection. No pleural effusions. No pneumothorax. No evidence of pulmonary edema. Heart size is normal. Upper mediastinal contours are within normal limits.  IMPRESSION: 1. Widespread interstitial prominence and focal ill-defined nodular density projecting over the region of the lingula. Given the patient's history, these findings could reflect sequela of prior atypical infection such as viral pneumonia. Follow-up standing PA and lateral chest radiograph is recommended in 2 months to ensure resolution of the lingular opacity.   Electronically Signed   By: Trudie Reed M.D.   On: 06/25/2019 21:00  Pt states between Nov and Jan he did not get the CXR because of financial reasons. No acute worsening in cough or breathing, but he is concerned because of his smoking and he wants to know "what it is" Sounds like the only change to chronic cough due to heavy smoking was a gradual increase in productive sputum and no acute worsening over the past several months.  But patient is a difficult historian.  He sounds concerned for Covid but also when I asked him what his concerns are he states that the government is trying to limit her population and is unclear whether or not he wants to be tested for Covid or wants to avoid anything with  Covid but his possible exposure which is low risk exposure occurred almost 3 to 4 weeks ago, testing would be unusual at this time.      Patient Active Problem List   Diagnosis Date Noted  . Chest pain 05/29/2018  . Hyperlipidemia 03/19/2018  . Chronic left hip pain 02/12/2018  . Chronic left-sided low back pain without sciatica 02/12/2018  . Neutrophilia 02/12/2018  . Schizoaffective disorder, bipolar type ([place]) 02/12/2018  . Encounter for tobacco use cessation  counseling 02/12/2018  . Overweight (BMI 25.0-29.9) 02/12/2018  . Allergic rhinitis 03/23/2017  . Hypertension 02/23/2017    Social History   Tobacco Use  . Smoking status: Current Every Day Smoker    Packs/day: 1.00    Years: 20.00    Pack years: 20.00    Types: Cigarettes    Start date: 77  . Smokeless tobacco: Former Network engineer Use Topics  . Alcohol use: Yes    Alcohol/week: 6.0 standard drinks    Types: 6 Cans of beer per week     Current Outpatient Medications:  .  atorvastatin (LIPITOR) 20 MG tablet, TAKE 1 TABLET(20 MG) BY MOUTH DAILY, Disp: 90 tablet, Rfl: 1 .  betamethasone valerate (VALISONE) 0.1 % cream, APP AA ON HANDS D DURING DIFFICULT FLARES, Disp: , Rfl: 3 .  ELIDEL 1 % cream, APPLY ON THE AFFECTED SKIN D, Disp: , Rfl: 3 .  fluticasone (FLONASE) 50 MCG/ACT nasal spray, Place 2 sprays into both nostrils daily., Disp: 16 g, Rfl: 6 .  hydrOXYzine (ATARAX/VISTARIL) 50 MG tablet, Take 1 tablet (50 mg total) by mouth 3 (three) times daily as needed. (Patient taking differently: Take 50 mg by mouth every 6 (six) hours as needed. ), Disp: 30 tablet, Rfl: 0 .  losartan (COZAAR) 25 MG tablet, Take 1 tablet (25 mg total) by mouth daily. Please call to schedule appointment for further refills., Disp: 15 tablet, Rfl: 0 .  Lurasidone HCl (LATUDA) 120 MG TABS, Take 1 tablet by mouth daily., Disp: , Rfl:  .  meloxicam (MOBIC) 15 MG tablet, Take 1 tablet (15 mg total) by mouth daily., Disp: 90 tablet, Rfl: 0  Allergies  Allergen Reactions  . Prednisone Nausea And Vomiting   I personally reviewed active problem list, medication list, allergies, family history, social history, health maintenance, notes from last encounter, lab results, imaging with the patient/caregiver today.  Review of Systems  Constitutional: Negative.  Negative for activity change, appetite change, chills, diaphoresis, fatigue and fever.  HENT: Negative.   Eyes: Negative.   Respiratory: Positive for  cough, chest tightness and shortness of breath. Negative for choking and stridor.   Cardiovascular: Negative.  Negative for palpitations and leg swelling.  Gastrointestinal: Negative.   Endocrine: Negative.   Genitourinary: Negative.   Musculoskeletal: Negative.   Skin: Negative.   Allergic/Immunologic: Negative.   Neurological: Negative.   Hematological: Negative.   Psychiatric/Behavioral: Negative.   All other systems reviewed and are negative.    Objective:   Virtual encounter, vitals limited, only able to obtain the following There were no vitals filed for this visit. There is no height or weight on file to calculate BMI. Nursing Note and Vital Signs reviewed.  Physical Exam Vitals and nursing note reviewed.  Pulmonary:     Comments: Able to speak in full and complete sentences Intermittent coughing No audible wheeze or stridor Neurological:     Mental Status: He is alert.     PE limited by telephone encounter  No results  found for this or any previous visit (from the past 72 hour(s)).  Assessment and Plan:     ICD-10-CM   1. Bronchitis, subacute  J20.9 doxycycline (VIBRAMYCIN) 100 MG capsule    albuterol (VENTOLIN HFA) 108 (90 Base) MCG/ACT inhaler    guaiFENesin (MUCINEX) 600 MG 12 hr tablet    benzonatate (TESSALON) 100 MG capsule   tx for acute bronchitis with pred, doxy, albuterol, cough meds, f/up in 1 month in person, do f/up CXR   2. Abnormal chest x-ray  R93.89    reviewed findings and explained findings and tx and f/up plan, pt has not been seen recently and only briefly mentioned CC in Nov routine visit  3. Educated about COVID-19 virus infection  Z71.89    potential exposure about 3-4 weeks ago, would be low risk exposure, tested not needed this far out, and pt very weary of anything related to Covid    One month in office f/up Treating for possible CAP with left lower lung pain and chronic bronchitis/smokers cough sx gradually worsening cough over  several months with productive sputum now brown.  No fever, sweats, weight loss.  Discussed at length CXR findings and possible dx, he was never actually seen or evaluated for his symptoms over several months and he had delayed completion of imaging which was ordered in November by his PCP.  I explained that this makes [place] difficult to tell him exactly what is most likely with the x-ray findings so we will treat for possible atypical pneumonia and for bronchitis and do a follow-up chest x-ray in the next 6 weeks or so and will review findings at that time for any further follow-up if needed.     -Red flags and when to present for emergency care or RTC including fever >101.87F, chest pain, shortness of breath, new/worsening/un-resolving symptoms,  reviewed with patient at time of visit. Follow up and care instructions discussed and provided in AVS. - I discussed the assessment and treatment plan with the patient. The patient was provided an opportunity to ask questions and all were answered. The patient agreed with the plan and demonstrated an understanding of the instructions.  I provided 19 minutes of non-face-to-face time during this encounter. Spent additional time today reviewed past visits, CXR results, arranging f/up care, chart documentation, etc.   [name], PA-C 07/08/19 11:38 AM

## 2019-07-12 ENCOUNTER — Encounter: Payer: Self-pay | Admitting: Family Medicine

## 2019-07-13 ENCOUNTER — Other Ambulatory Visit: Payer: Self-pay | Admitting: Cardiovascular Disease

## 2019-07-15 ENCOUNTER — Other Ambulatory Visit: Payer: Self-pay | Admitting: Family Medicine

## 2019-07-15 MED ORDER — AZITHROMYCIN 250 MG PO TABS: 250.0000 mg | ORAL_TABLET | Freq: Every day | ORAL | 0 refills | Status: DC

## 2019-07-15 NOTE — Progress Notes (Signed)
Pt complained of N/V with meds to treat suspected acute bronchitis  D/c doxy, can try zpak Steroid should be complete at this time, no more steroids If N/V and respiratory sx not improving and pt unable to tolerate PO's recommend he go to UC for eval  Hesitate to rx zpak and antiemetics due to possibility of multiple QTc prolonging meds without knowing his baseline.  Push clear fluids slowly 1/2 cup every 30 min and advance slowly  Instructions sent to nurse to call pt back with plan  [name], PA-C

## 2019-07-15 NOTE — Telephone Encounter (Signed)
Please schedule overdue F/U appointment with Dr. Gollan. Thank you! 

## 2019-07-15 NOTE — Telephone Encounter (Signed)
LVM to schedule

## 2019-07-18 ENCOUNTER — Encounter: Payer: Self-pay | Admitting: Family Medicine

## 2019-07-18 DIAGNOSIS — E782 Mixed hyperlipidemia: Secondary | ICD-10-CM

## 2019-07-18 NOTE — Progress Notes (Deleted)
Cardiology Office Note    Date:  07/19/2019   ID:  David Phillips, DOB [birth year], MRN [id number]  PCP:  Doren Custard, FNP  Cardiologist:  Julien Nordmann, MD  Electrophysiologist:  None   Chief Complaint: Follow-up  History of Present Illness:   David Phillips is a 34 y.o. male with history of schizoaffective disorder, uncomplicated HTN, HLD, tobacco use, chronic leukocytosis with neutrophilia followed by hematology, and gastric ulcer who presents for follow-up of hypertension.  Patient was seen in the ED in 04/2018 for right-sided chest pain localized to the lower ribs and tender to palpation.  Troponin negative.  He followed up with cardiology in 05/2018 with pain felt to be atypical and patient was advised to follow-up with chiropractor.  BP was elevated in the 150s systolic.  Patient wanted to stop lisinopril she was concerned about side effects/erectile dysfunction.  He was started on losartan 25 mg daily.  Follow-up was recommended as needed.  ***   Labs independently reviewed: 04/2019 - BUN 13, serum creatinine 1.13, potassium 4.4, albumin 4.8, AST/ALT normal, TC 176, TG 284, HDL 45, LDL 90, Hgb 16.8, PLT 324  Past Medical History:  Diagnosis Date  . Essential hypertension   . Gastric ulcer   . Schizoaffective disorder (HCC)     No past surgical history on file.  Current Medications: No outpatient medications have been marked as taking for the 07/23/19 encounter (Appointment) with Sondra Barges, PA-C.    Allergies:   Prednisone   Social History   Socioeconomic History  . Marital status: Single    Spouse name: Not on file  . Number of children: 0  . Years of education: Not on file  . Highest education level: Not on file  Occupational History  . Occupation: looking for a Administrator, sports job  . Occupation: disability  Tobacco Use  . Smoking status: Current Every Day Smoker    Packs/day: 1.00    Years: 20.00    Pack years: 20.00    Types: Cigarettes    Start date:  59  . Smokeless tobacco: Former Engineer, water and Sexual Activity  . Alcohol use: Yes    Alcohol/week: 6.0 standard drinks    Types: 6 Cans of beer per week  . Drug use: Yes    Comment: h/o drug use in the past.  . Sexual activity: Not Currently  Other Topics Concern  . Not on file  Social History Narrative  . Not on file   Social Determinants of Health   Financial Resource Strain:   . Difficulty of Paying Living Expenses: Not on file  Food Insecurity:   . Worried About Programme researcher, broadcasting/film/video in the Last Year: Not on file  . Ran Out of Food in the Last Year: Not on file  Transportation Needs:   . Lack of Transportation (Medical): Not on file  . Lack of Transportation (Non-Medical): Not on file  Physical Activity:   . Days of Exercise per Week: Not on file  . Minutes of Exercise per Session: Not on file  Stress:   . Feeling of Stress : Not on file  Social Connections:   . Frequency of Communication with Friends and Family: Not on file  . Frequency of Social Gatherings with Friends and Family: Not on file  . Attends Religious Services: Not on file  . Active Member of Clubs or Organizations: Not on file  . Attends Banker Meetings: Not on file  . Marital Status:  Not on file     Family History:  The patient's family history includes Diabetes in his father and mother; Heart attack (age of onset: 77) in his father and paternal grandfather; Schizophrenia in an other family member. There is no history of Other.  ROS:   ROS   EKGs/Labs/Other Studies Reviewed:    Studies reviewed were summarized above. The additional studies were reviewed today: ***  EKG:  EKG is ordered today.  The EKG ordered today demonstrates ***  Recent Labs: 05/09/2019: ALT 46; BUN 13; Creat 1.13; Hemoglobin 16.8; Platelets 324; Potassium 4.4; Sodium 138  Recent Lipid Panel    Component Value Date/Time   CHOL 176 05/09/2019 0000   CHOL 191 02/23/2017 1853   TRIG 284 (H) 05/09/2019  0000   HDL 45 05/09/2019 0000   HDL 36 (L) 02/23/2017 1853   CHOLHDL 3.9 05/09/2019 0000   LDLCALC 90 05/09/2019 0000    PHYSICAL EXAM:    VS:  There were no vitals taken for this visit.  BMI: There is no height or weight on file to calculate BMI.  Physical Exam  Wt Readings from Last 3 Encounters:  05/09/19 176 lb 4.8 oz (80 kg)  03/06/19 180 lb (81.6 kg)  11/27/18 184 lb 3.2 oz (83.6 kg)     ASSESSMENT & PLAN:   1. HTN: Blood pressure ***  2. HLD: LDL of 90 from 04/2019.  3. Tobacco use:  Disposition: F/u with Dr. Rockey Situ or an APP in ***.   Medication Adjustments/Labs and Tests Ordered: Current medicines are reviewed at length with the patient today.  Concerns regarding medicines are outlined above. Medication changes, Labs and Tests ordered today are summarized above and listed in the Patient Instructions accessible in Encounters.   Signed, Christell Faith, PA-C 07/19/2019 1:32 PM     [address] [place], [place] [address] [phone]

## 2019-07-19 ENCOUNTER — Encounter: Payer: Self-pay | Admitting: Physician Assistant

## 2019-07-19 MED ORDER — ATORVASTATIN CALCIUM 20 MG PO TABS: ORAL_TABLET | ORAL | 1 refills | Status: DC

## 2019-07-19 NOTE — Telephone Encounter (Signed)
Route to [facility] for her pt normal stuff until she is actually on maternity leave please

## 2019-07-23 ENCOUNTER — Other Ambulatory Visit: Payer: Self-pay | Admitting: Cardiovascular Disease

## 2019-07-23 ENCOUNTER — Ambulatory Visit: Payer: Medicare Other | Admitting: Physician Assistant

## 2019-08-04 ENCOUNTER — Other Ambulatory Visit: Payer: Self-pay | Admitting: Family Medicine

## 2019-08-04 DIAGNOSIS — M25552 Pain in left hip: Secondary | ICD-10-CM

## 2019-08-04 DIAGNOSIS — G8929 Other chronic pain: Secondary | ICD-10-CM

## 2019-08-04 DIAGNOSIS — M545 Low back pain, unspecified: Secondary | ICD-10-CM

## 2019-08-09 ENCOUNTER — Ambulatory Visit
Admission: RE | Admit: 2019-08-09 | Discharge: 2019-08-09 | Disposition: A | Discharge status: Home / Self Care | Payer: Medicare Other | Source: Ambulatory Visit | Attending: Family Medicine | Admitting: Family Medicine

## 2019-08-09 ENCOUNTER — Ambulatory Visit (INDEPENDENT_AMBULATORY_CARE_PROVIDER_SITE_OTHER): Payer: Medicare Other | Admitting: Family Medicine

## 2019-08-09 ENCOUNTER — Other Ambulatory Visit: Payer: Self-pay

## 2019-08-09 ENCOUNTER — Ambulatory Visit
Admission: RE | Admit: 2019-08-09 | Discharge: 2019-08-09 | Disposition: A | Discharge status: Home / Self Care | Payer: Medicare Other | Attending: Family Medicine | Admitting: Family Medicine

## 2019-08-09 ENCOUNTER — Encounter: Payer: Self-pay | Admitting: Family Medicine

## 2019-08-09 VITALS — BP 130/82 | HR 91 | Temp 98.0°F | Resp 14 | Ht 69.0 in | Wt 182.4 lb

## 2019-08-09 DIAGNOSIS — R1110 Vomiting, unspecified: Secondary | ICD-10-CM | POA: Diagnosis not present

## 2019-08-09 DIAGNOSIS — J209 Acute bronchitis, unspecified: Secondary | ICD-10-CM

## 2019-08-09 DIAGNOSIS — R9389 Abnormal findings on diagnostic imaging of other specified body structures: Secondary | ICD-10-CM | POA: Diagnosis present

## 2019-08-09 DIAGNOSIS — R05 Cough: Secondary | ICD-10-CM | POA: Diagnosis not present

## 2019-08-09 DIAGNOSIS — F17200 Nicotine dependence, unspecified, uncomplicated: Secondary | ICD-10-CM

## 2019-08-09 DIAGNOSIS — Z716 Tobacco abuse counseling: Secondary | ICD-10-CM

## 2019-08-09 DIAGNOSIS — J069 Acute upper respiratory infection, unspecified: Secondary | ICD-10-CM

## 2019-08-09 DIAGNOSIS — R053 Chronic cough: Secondary | ICD-10-CM

## 2019-08-09 MED ORDER — CETIRIZINE HCL 10 MG PO TABS: 10.0000 mg | ORAL_TABLET | Freq: Every day | ORAL | 11 refills | Status: DC

## 2019-08-09 MED ORDER — BUDESONIDE-FORMOTEROL FUMARATE 80-4.5 MCG/ACT IN AERO: 2.0000 | INHALATION_SPRAY | Freq: Two times a day (BID) | RESPIRATORY_TRACT | 12 refills | Status: DC

## 2019-08-09 MED ORDER — FLUTICASONE PROPIONATE 50 MCG/ACT NA SUSP: 2.0000 | Freq: Every day | NASAL | 6 refills | Status: DC

## 2019-08-09 MED ORDER — PROMETHAZINE-DM 6.25-15 MG/5ML PO SYRP: 5.0000 mL | ORAL_SOLUTION | Freq: Four times a day (QID) | ORAL | 1 refills | Status: DC | PRN

## 2019-08-09 NOTE — Progress Notes (Signed)
Patient ID: David Phillips, male    DOB: April 22, 1986, 34 y.o.   MRN: [id number]  PCP: Doren Custard, FNP  Chief Complaint  Patient presents with  . Follow-up  . Cough    abnormal chest xray    Subjective:   David Phillips is a 34 y.o. male, presents to clinic with CC of the following:  HPI  Patient presents for follow-up of abnormal chest x-ray, was seen for a visit a few weeks ago -see interval history and HPI below He got a repeat chest x-ray and is here for follow-up and also complains of continued worsening cough symptoms.  He states that he has almost daily morning coughing and vomiting x 5 years - emesis is usually something that he drank with some mucous, phlegm or sometimes streaks of blood.  Further questioning -usually has some congestion in the back of his throat that he clears first in the morning some intermittent coughing he does not have any indigestion or reflux that he knows of, typically he will have some emesis that sounds only posttussive emesis and otherwise does not have any nausea or vomiting indigestion Pretty constant morning coughing  07/08/2019 virtual encounter - Pt presents via telephone for questions regarding CXR report. He was seen only by his PCP in May 09, 2019  He complained of CP and coughing and she ordered a CXR but it was not done Pt smoked 2+ ppd for most of his life, so that's his concern.   Pt has some pain to his left lower lung, notices when he takes a deep breath, but also can feel it just here and there. Some mild SOB, breathing more labored with activity, chronic cough is slightly worse, cough is productive sputum brown sometimes darker spots in it No fever, chills, sweats He has lost some weight over the past 6-8 months Was 184 now 176 Patient has significant smoking history, at least 1 pack a day for the past 20 years  We reviewed his CXR today at length and pt will go to do repeated cxr today   Patient Active Problem List   Diagnosis Date Noted  . Chest pain 05/29/2018  . Hyperlipidemia 03/19/2018  . Chronic left hip pain 02/12/2018  . Chronic left-sided low back pain without sciatica 02/12/2018  . Neutrophilia 02/12/2018  . Schizoaffective disorder, bipolar type (HCC) 02/12/2018  . Encounter for tobacco use cessation counseling 02/12/2018  . Overweight (BMI 25.0-29.9) 02/12/2018  . Allergic rhinitis 03/23/2017  . Hypertension 02/23/2017      Current Outpatient Medications:  .  albuterol (VENTOLIN HFA) 108 (90 Base) MCG/ACT inhaler, Inhale 2 puffs into the lungs every 4 (four) hours as needed for wheezing or shortness of breath (coughing fits)., Disp: 18 g, Rfl: 1 .  atorvastatin (LIPITOR) 20 MG tablet, TAKE 1 TABLET(20 MG) BY MOUTH DAILY, Disp: 90 tablet, Rfl: 1 .  betamethasone valerate (VALISONE) 0.1 % cream, APP AA ON HANDS D DURING DIFFICULT FLARES, Disp: , Rfl: 3 .  ELIDEL 1 % cream, APPLY ON THE AFFECTED SKIN D, Disp: , Rfl: 3 .  fluticasone (FLONASE) 50 MCG/ACT nasal spray, Place 2 sprays into both nostrils daily., Disp: 16 g, Rfl: 6 .  hydrOXYzine (ATARAX/VISTARIL) 50 MG tablet, Take 1 tablet (50 mg total) by mouth 3 (three) times daily as needed. (Patient taking differently: Take 50 mg by mouth every 6 (six) hours as needed. ), Disp: 30 tablet, Rfl: 0 .  losartan (COZAAR) 25 MG tablet, TAKE 1 TABLET(25  MG) BY MOUTH DAILY, Disp: 90 tablet, Rfl: 0 .  Lurasidone HCl (LATUDA) 120 MG TABS, Take 1 tablet by mouth daily., Disp: , Rfl:  .  meloxicam (MOBIC) 15 MG tablet, TAKE 1 TABLET(15 MG) BY MOUTH DAILY, Disp: 90 tablet, Rfl: 0   Allergies  Allergen Reactions  . Prednisone Nausea And Vomiting     Family History  Problem Relation Age of Onset  . Diabetes Mother   . Diabetes Father   . Heart attack Father 58       Bypass surgeries; 3 MI's  . Schizophrenia Other   . Heart attack Paternal Grandfather 47  . Other Neg Hx      Social History   Socioeconomic History  . Marital status: Single     Spouse name: Not on file  . Number of children: 0  . Years of education: Not on file  . Highest education level: Not on file  Occupational History  . Occupation: looking for a Administrator, sports job  . Occupation: disability  Tobacco Use  . Smoking status: Current Every Day Smoker    Packs/day: 1.00    Years: 20.00    Pack years: 20.00    Types: Cigarettes    Start date: 95  . Smokeless tobacco: Former Engineer, water and Sexual Activity  . Alcohol use: Yes    Alcohol/week: 6.0 standard drinks    Types: 6 Cans of beer per week  . Drug use: Yes    Comment: h/o drug use in the past.  . Sexual activity: Not Currently  Other Topics Concern  . Not on file  Social History Narrative  . Not on file   Social Determinants of Health   Financial Resource Strain:   . Difficulty of Paying Living Expenses: Not on file  Food Insecurity:   . Worried About Programme researcher, broadcasting/film/video in the Last Year: Not on file  . Ran Out of Food in the Last Year: Not on file  Transportation Needs:   . Lack of Transportation (Medical): Not on file  . Lack of Transportation (Non-Medical): Not on file  Physical Activity:   . Days of Exercise per Week: Not on file  . Minutes of Exercise per Session: Not on file  Stress:   . Feeling of Stress : Not on file  Social Connections:   . Frequency of Communication with Friends and Family: Not on file  . Frequency of Social Gatherings with Friends and Family: Not on file  . Attends Religious Services: Not on file  . Active Member of Clubs or Organizations: Not on file  . Attends Banker Meetings: Not on file  . Marital Status: Not on file  Intimate Partner Violence:   . Fear of Current or Ex-Partner: Not on file  . Emotionally Abused: Not on file  . Physically Abused: Not on file  . Sexually Abused: Not on file    Chart Review Today: I personally reviewed active problem list, medication list, allergies, family history, social history, health maintenance,  notes from last encounter, lab results, imaging with the patient/caregiver today.   Review of Systems  Constitutional: Negative.   HENT: Negative.   Eyes: Negative.   Respiratory: Negative.   Cardiovascular: Negative.   Gastrointestinal: Negative.   Endocrine: Negative.   Genitourinary: Negative.   Musculoskeletal: Negative.   Skin: Negative.   Allergic/Immunologic: Negative.   Neurological: Negative.   Hematological: Negative.   Psychiatric/Behavioral: Negative.   All other systems reviewed and are negative.  Objective:   Vitals:   08/09/19 1309  BP: 130/82  Pulse: 91  Resp: 14  Temp: 98 F (36.7 C)  SpO2: 98%  Weight: 182 lb 6.4 oz (82.7 kg)  Height: 5\' 9"  (1.753 m)    Body mass index is 26.94 kg/m.  Physical Exam Vitals and nursing note reviewed.  Constitutional:      Appearance: He is well-developed.  HENT:     Head: Normocephalic and atraumatic.     Nose: Nose normal.  Eyes:     General:        Right eye: No discharge.        Left eye: No discharge.     Conjunctiva/sclera: Conjunctivae normal.  Neck:     Trachea: No tracheal deviation.  Cardiovascular:     Rate and Rhythm: Normal rate and regular rhythm.  Pulmonary:     Effort: Pulmonary effort is normal. No respiratory distress.     Breath sounds: No stridor. Wheezing present. No rhonchi or rales.  Musculoskeletal:        General: Normal range of motion.  Skin:    General: Skin is warm and dry.     Findings: No rash.  Neurological:     Mental Status: He is alert.     Motor: No abnormal muscle tone.     Coordination: Coordination normal.  Psychiatric:        Behavior: Behavior normal.       CLINICAL DATA:  34 year old male with history of mid chest pain/tightness for the past 2-3 months. Cough.  EXAM: CHEST - 2 VIEW  COMPARISON:  None.  FINDINGS: Interstitial prominence throughout the lungs bilaterally with ill-defined nodular density projecting over the left lower  lung, apparently within the lingula on the lateral projection. No pleural effusions. No pneumothorax. No evidence of pulmonary edema. Heart size is normal. Upper mediastinal contours are within normal limits.  IMPRESSION: 1. Widespread interstitial prominence and focal ill-defined nodular density projecting over the region of the lingula. Given the patient's history, these findings could reflect sequela of prior atypical infection such as viral pneumonia. Follow-up standing PA and lateral chest radiograph is recommended in 2 months to ensure resolution of the lingular opacity.   Electronically Signed   By: Vinnie Langton M.D.   On: 06/25/2019 21:00     Assessment & Plan:      ICD-10-CM   1. Bronchitis, subacute  J20.9 DG Chest 2 View    Ambulatory referral to Pulmonology    budesonide-formoterol (SYMBICORT) 80-4.5 MCG/ACT inhaler    promethazine-dextromethorphan (PROMETHAZINE-DM) 6.25-15 MG/5ML syrup  2. Abnormal chest x-ray  R93.89 DG Chest 2 View    Ambulatory referral to Pulmonology  3. Chronic coughing  R05 Ambulatory referral to Pulmonology    budesonide-formoterol (SYMBICORT) 80-4.5 MCG/ACT inhaler    promethazine-dextromethorphan (PROMETHAZINE-DM) 6.25-15 MG/5ML syrup  4. Post-tussive emesis  R11.10 Ambulatory referral to Pulmonology  5. Encounter for tobacco use cessation counseling  Z71.6   6. Current smoker  F17.200 Ambulatory referral to Pulmonology  7. Viral upper respiratory tract infection  J06.9 promethazine-dextromethorphan (PROMETHAZINE-DM) 6.25-15 MG/5ML syrup    fluticasone (FLONASE) 50 MCG/ACT nasal spray    cetirizine (ZYRTEC) 10 MG tablet    Pt and his sister here today, prefer to est with pulmonology -with his increased smoking he believes his coughing and breathing has worsened, has some slight expiratory wheeze today, his symptoms of morning frequent coughing with posttussive emesis we discussed that it could be from acid reflux or from  some  postnasal drip have encouraged him to start treating with Flonase and Zyrtec or other similar medications see if that will improve his morning coughing, cut back on smoking is much as he can, did offer referral to the stop smoking program team at [facility], will do a daily inhaler, recheck his chest x-ray and have him follow-up with pulmonology for further evaluation since I do not currently have access to PFTs  Smoking cessation instruction/counseling given:  counseled patient on the dangers of tobacco use, advised patient to stop smoking, and reviewed strategies to maximize success     [name], PA-C 08/09/19 1:17 PM

## 2019-08-10 ENCOUNTER — Encounter: Payer: Self-pay | Admitting: Family Medicine

## 2019-08-12 NOTE — Progress Notes (Deleted)
I'll have to look, I'm still getting used to medicare/medicaid coverage and I haven't had a change to look up his insurance.  I believe he said they were around $50 before and he couldn't afford.    Thanks

## 2019-08-13 ENCOUNTER — Telehealth: Payer: Self-pay | Admitting: *Deleted

## 2019-08-13 NOTE — Telephone Encounter (Signed)
Contacted patient and reviewed [employer] Smoking Cessation program as well as options for 1:1 and group sessions. Texted information for patient to sign up. Patient has my contact information for any questions or concerns.

## 2019-08-13 NOTE — Telephone Encounter (Signed)
Thanks so much! I've asked Asher Muir to look into insurance coverage for patches etc.

## 2019-08-14 NOTE — Progress Notes (Deleted)
I will ask David Phillips because she said all Medicare plans legally have to publish their cover medications.  I will put something in after I have a chance to look at that

## 2019-08-15 MED ORDER — NICOTINE 21 MG/24HR TD PT24: 21.0000 mg | MEDICATED_PATCH | Freq: Every day | TRANSDERMAL | 0 refills | Status: DC

## 2019-08-15 NOTE — Addendum Note (Signed)
Addended by: [name] on: 08/15/2019 05:22 PM   Modules accepted: Orders

## 2019-09-17 ENCOUNTER — Institutional Professional Consult (permissible substitution): Payer: Medicare Other | Admitting: Pulmonary Disease

## 2019-10-03 ENCOUNTER — Other Ambulatory Visit: Payer: Self-pay | Admitting: Family Medicine

## 2019-10-03 DIAGNOSIS — E782 Mixed hyperlipidemia: Secondary | ICD-10-CM

## 2019-10-03 NOTE — Telephone Encounter (Signed)
Requested Prescriptions  Pending Prescriptions Disp Refills  . atorvastatin (LIPITOR) 20 MG tablet [Pharmacy Med Name: ATORVASTATIN 20MG  TABLETS] 90 tablet 1    Sig: TAKE 1 TABLET(20 MG) BY MOUTH DAILY     Cardiovascular:  Antilipid - Statins Failed - 10/03/2019  9:09 AM      Failed - Triglycerides in normal range and within 360 days    Triglycerides  Date Value Ref Range Status  05/09/2019 284 (H) <150 mg/dL Final    Comment:    . If a non-fasting specimen was collected, consider repeat triglyceride testing on a fasting specimen if clinically indicated.  05/11/2019 et al. J. of Clin. Lipidol. 2015;9:129-169. [place]          Passed - Total Cholesterol in normal range and within 360 days    Cholesterol, Total  Date Value Ref Range Status  02/23/2017 191 100 - 199 mg/dL Final   Cholesterol  Date Value Ref Range Status  05/09/2019 176 <200 mg/dL Final         Passed - LDL in normal range and within 360 days    LDL Cholesterol (Calc)  Date Value Ref Range Status  05/09/2019 90 mg/dL (calc) Final    Comment:    Reference range: <100 . Desirable range <100 mg/dL for primary prevention;   <70 mg/dL for patients with CHD or diabetic patients  with > or = 2 CHD risk factors. 05/11/2019 LDL-C is now calculated using the Martin-Hopkins  calculation, which is a validated novel method providing  better accuracy than the Friedewald equation in the  estimation of LDL-C.  Marland Kitchen et al. Horald Pollen. Lenox Ahr): 2061-2068  (http://education.[facility].com/faq/FAQ164)          Passed - HDL in normal range and within 360 days    HDL  Date Value Ref Range Status  05/09/2019 45 > OR = 40 mg/dL Final  05/11/2019 36 (L) >39 mg/dL Final         Passed - Patient is not pregnant      Passed - Valid encounter within last 12 months    Recent Outpatient Visits          1 month ago Bronchitis, subacute   [facility], PA-C   2 months ago Bronchitis, subacute   [facility], PA-C   4 months ago Encounter for tobacco use cessation counseling   [facility] [place], FNP   10 months ago Screening for STD (sexually transmitted disease)   [facility], NP   1 year ago Viral upper respiratory tract infection   [facility] [place], FNP      Future Appointments            In 2 months Gerome Apley, FNP [facility]

## 2019-10-10 ENCOUNTER — Institutional Professional Consult (permissible substitution): Payer: Medicare Other | Admitting: Pulmonary Disease

## 2019-10-19 ENCOUNTER — Other Ambulatory Visit: Payer: Self-pay | Admitting: Cardiovascular Disease

## 2019-10-31 ENCOUNTER — Encounter: Payer: Self-pay | Admitting: Family Medicine

## 2019-11-01 ENCOUNTER — Ambulatory Visit: Payer: Medicaid Other | Admitting: Family Medicine

## 2019-11-21 ENCOUNTER — Institutional Professional Consult (permissible substitution): Payer: Medicare Other | Admitting: Pulmonary Disease

## 2019-12-01 ENCOUNTER — Other Ambulatory Visit: Payer: Self-pay

## 2019-12-01 ENCOUNTER — Emergency Department
Admission: EM | Admit: 2019-12-01 | Discharge: 2019-12-01 | Disposition: A | Discharge status: Home / Self Care | Payer: Medicare Other | Attending: Emergency Medicine | Admitting: Emergency Medicine

## 2019-12-01 DIAGNOSIS — Y9389 Activity, other specified: Secondary | ICD-10-CM | POA: Insufficient documentation

## 2019-12-01 DIAGNOSIS — I10 Essential (primary) hypertension: Secondary | ICD-10-CM | POA: Insufficient documentation

## 2019-12-01 DIAGNOSIS — W57XXXA Bitten or stung by nonvenomous insect and other nonvenomous arthropods, initial encounter: Secondary | ICD-10-CM | POA: Insufficient documentation

## 2019-12-01 DIAGNOSIS — S60461A Insect bite (nonvenomous) of left index finger, initial encounter: Secondary | ICD-10-CM | POA: Diagnosis present

## 2019-12-01 DIAGNOSIS — Y999 Unspecified external cause status: Secondary | ICD-10-CM | POA: Insufficient documentation

## 2019-12-01 DIAGNOSIS — F17210 Nicotine dependence, cigarettes, uncomplicated: Secondary | ICD-10-CM | POA: Insufficient documentation

## 2019-12-01 DIAGNOSIS — T63301A Toxic effect of unspecified spider venom, accidental (unintentional), initial encounter: Secondary | ICD-10-CM

## 2019-12-01 DIAGNOSIS — Y929 Unspecified place or not applicable: Secondary | ICD-10-CM | POA: Diagnosis not present

## 2019-12-01 MED ORDER — SULFAMETHOXAZOLE-TRIMETHOPRIM 800-160 MG PO TABS: 1.0000 | ORAL_TABLET | Freq: Two times a day (BID) | ORAL | 0 refills | Status: AC

## 2019-12-01 NOTE — Discharge Instructions (Signed)
Your exam is normal at this time. You did not receive a black widow spider bite. Take the antibiotic as directed. Follow-up with your provider or return as needed.

## 2019-12-01 NOTE — ED Provider Notes (Signed)
[facility] Emergency Department Provider Note ____________________________________________  Time seen: 2015  I have reviewed the triage vital signs and the nursing notes.  HISTORY  Chief Complaint  Insect Bite  HPI David Phillips is a 34 y.o. male presents himself to the ED for evaluation of a suspected spider bite. The bite occurred after he reached under the couch to grab a vest. He was bitten by a black spider that he subsequently stepped on. he presents the spider for review, concerned that it is a black widow. No pain, fever, redness, swelling, chest pain, or nausea.    Past Medical History:  Diagnosis Date  . Essential hypertension   . Gastric ulcer   . Schizoaffective disorder [facility])     Patient Active Problem List   Diagnosis Date Noted  . Chest pain 05/29/2018  . Hyperlipidemia 03/19/2018  . Chronic left hip pain 02/12/2018  . Chronic left-sided low back pain without sciatica 02/12/2018  . Neutrophilia 02/12/2018  . Schizoaffective disorder, bipolar type (HCC) 02/12/2018  . Encounter for tobacco use cessation counseling 02/12/2018  . Overweight (BMI 25.0-29.9) 02/12/2018  . Allergic rhinitis 03/23/2017  . Hypertension 02/23/2017    History reviewed. No pertinent surgical history.  Prior to Admission medications   Medication Sig Start Date End Date Taking? Authorizing Provider  albuterol (VENTOLIN HFA) 108 (90 Base) MCG/ACT inhaler Inhale 2 puffs into the lungs every 4 (four) hours as needed for wheezing or shortness of breath (coughing fits). 07/08/19   [name], PA-C  atorvastatin (LIPITOR) 20 MG tablet TAKE 1 TABLET(20 MG) BY MOUTH DAILY 10/03/19   Doren Custard, FNP  betamethasone valerate (VALISONE) 0.1 % cream APP AA ON HANDS D DURING DIFFICULT FLARES 03/01/18   [provider]  budesonide-formoterol (SYMBICORT) 80-4.5 MCG/ACT inhaler Inhale 2 puffs into the lungs in the morning and at bedtime. 08/09/19   [name], PA-C   cetirizine (ZYRTEC) 10 MG tablet Take 1 tablet (10 mg total) by mouth at bedtime. 08/09/19   [name], PA-C  ELIDEL 1 % cream APPLY ON THE AFFECTED SKIN D 02/28/18   [provider]  fluticasone (FLONASE) 50 MCG/ACT nasal spray Place 2 sprays into both nostrils daily. 08/09/19   [name], PA-C  hydrOXYzine (ATARAX/VISTARIL) 50 MG tablet Take 1 tablet (50 mg total) by mouth 3 (three) times daily as needed. Patient taking differently: Take 50 mg by mouth every 6 (six) hours as needed.  12/02/17   Joni Reining, PA-C  losartan (COZAAR) 25 MG tablet TAKE 1 TABLET(25 MG) BY MOUTH DAILY 10/21/19   Antonieta Iba, MD  Lurasidone HCl (LATUDA) 120 MG TABS Take 1 tablet by mouth daily.    [provider]  meloxicam (MOBIC) 15 MG tablet TAKE 1 TABLET(15 MG) BY MOUTH DAILY 08/04/19   Doren Custard, FNP  nicotine (NICODERM CQ - DOSED IN MG/24 HOURS) 21 mg/24hr patch Place 1 patch (21 mg total) onto the skin daily. 08/15/19   [name], PA-C  promethazine-dextromethorphan (PROMETHAZINE-DM) 6.25-15 MG/5ML syrup Take 5 mLs by mouth 4 (four) times daily as needed for cough. 08/09/19   [name], PA-C  sulfamethoxazole-trimethoprim (BACTRIM DS) 800-160 MG tablet Take 1 tablet by mouth 2 (two) times daily for 5 days. 12/01/19 12/06/19  Tyaire Odem, Charlesetta Ivory, PA-C    Allergies Prednisone  Family History  Problem Relation Age of Onset  . Diabetes Mother   . Diabetes Father   . Heart attack Father 24  Bypass surgeries; 3 MI's  . Schizophrenia Other   . Heart attack Paternal Grandfather 76  . Other Neg Hx     Social History Social History   Tobacco Use  . Smoking status: Current Every Day Smoker    Packs/day: 1.00    Years: 20.00    Pack years: 20.00    Types: Cigarettes    Start date: 20  . Smokeless tobacco: Former Clinical biochemist  . Vaping Use: Never used  Substance Use Topics  . Alcohol use: Yes    Alcohol/week: 6.0 standard drinks    Types: 6 Cans of  beer per week  . Drug use: Yes    Comment: h/o drug use in the past.    Review of Systems  Constitutional: Negative for fever. Cardiovascular: Negative for chest pain. Respiratory: Negative for shortness of breath. Gastrointestinal: Negative for abdominal pain, vomiting and diarrhea. Genitourinary: Negative for dysuria. Musculoskeletal: Negative for back pain. Skin: Negative for rash. Left index finger bite.  Neurological: Negative for headaches, focal weakness or numbness. ____________________________________________  PHYSICAL EXAM:  VITAL SIGNS: ED Triage Vitals  Enc Vitals Group     BP 12/01/19 1919 (!) 156/102     Pulse Rate 12/01/19 1919 85     Resp 12/01/19 1919 18     Temp 12/01/19 1919 98.2 F (36.8 C)     Temp src --      SpO2 12/01/19 1919 98 %     Weight 12/01/19 1920 174 lb (78.9 kg)     Height 12/01/19 1920 5\' 8"  (1.727 m)     Head Circumference --      Peak Flow --      Pain Score 12/01/19 1919 3     Pain Loc --      Pain Edu? --      Excl. in GC? --     Constitutional: Alert and oriented. Well appearing and in no distress. Head: Normocephalic and atraumatic. Eyes: Conjunctivae are normal.  Normal extraocular movements Cardiovascular: Normal rate, regular rhythm. Normal distal pulses. Respiratory: Normal respiratory effort.  Musculoskeletal: normal composite fist. No gross evidence of an insect or spider bite to the left index finger. No erythema, edema, or punctures noted. Nontender with normal range of motion in all extremities.  Neurologic:  Normal gait without ataxia. Normal speech and language. No gross focal neurologic deficits are appreciated. Skin:  Skin is warm, dry and intact. No rash noted. ___________________________________________  PROCEDURES  Procedures ____________________________________________  INITIAL IMPRESSION / ASSESSMENT AND PLAN / ED COURSE  Patient presents for evaluation of a spider bite to the left index finger. No  signs of acute infection or abscess. The spider he presented is not a black widow spider. He may take the prescription Bactrim he asked for and follow-up with his provider.   Kelven Flater was evaluated in Emergency Department on 12/01/2019 for the symptoms described in the history of present illness. He was evaluated in the context of the global COVID-19 pandemic, which necessitated consideration that the patient might be at risk for infection with the SARS-CoV-2 virus that causes COVID-19. Institutional protocols and algorithms that pertain to the evaluation of patients at risk for COVID-19 are in a state of rapid change based on information released by regulatory bodies including the CDC and federal and state organizations. These policies and algorithms were followed during the patient's care in the ED. ____________________________________________  FINAL CLINICAL IMPRESSION(S) / ED DIAGNOSES  Final diagnoses:  Spider bite wound, accidental  or unintentional, initial encounter      Melvenia Needles, PA-C 12/01/19 2342    Vanessa [place]ich[place], MD 12/03/19 [phone]

## 2019-12-01 NOTE — ED Triage Notes (Signed)
Patient c/o possible black widow bite to index finger left hand. Patient felt sting on finger, looked down and saw spider. No obvious bite seen to area indicated by patient. Patient brought spider with him for confirmation that it is a black widow.

## 2019-12-05 ENCOUNTER — Institutional Professional Consult (permissible substitution): Payer: Medicare Other | Admitting: Pulmonary Disease

## 2019-12-06 ENCOUNTER — Ambulatory Visit: Payer: Medicare Other | Admitting: Family Medicine

## 2019-12-31 ENCOUNTER — Other Ambulatory Visit: Payer: Self-pay | Admitting: Cardiovascular Disease

## 2020-01-08 ENCOUNTER — Ambulatory Visit: Payer: Medicare Other | Admitting: Pulmonary Disease

## 2020-01-08 ENCOUNTER — Other Ambulatory Visit: Payer: Self-pay

## 2020-01-08 ENCOUNTER — Encounter: Payer: Self-pay | Admitting: Pulmonary Disease

## 2020-01-08 VITALS — BP 160/98 | HR 89 | Temp 98.9°F | Ht 68.0 in | Wt 172.2 lb

## 2020-01-08 DIAGNOSIS — K219 Gastro-esophageal reflux disease without esophagitis: Secondary | ICD-10-CM

## 2020-01-08 DIAGNOSIS — J849 Interstitial pulmonary disease, unspecified: Secondary | ICD-10-CM

## 2020-01-08 DIAGNOSIS — J449 Chronic obstructive pulmonary disease, unspecified: Secondary | ICD-10-CM

## 2020-01-08 MED ORDER — OMEPRAZOLE 40 MG PO CPDR: 40.0000 mg | DELAYED_RELEASE_CAPSULE | Freq: Every day | ORAL | 3 refills | Status: DC

## 2020-01-08 MED ORDER — TRELEGY ELLIPTA 100-62.5-25 MCG/INH IN AEPB: 1.0000 | INHALATION_SPRAY | Freq: Every day | RESPIRATORY_TRACT | 0 refills | Status: AC

## 2020-01-08 NOTE — Progress Notes (Signed)
 Assessment & Plan:  1. COPD suggested by initial evaluation (Primary)  2. Interstitial pulmonary disease (HCC) - CT Chest W Contrast; Future  3. Gastroesophageal reflux disease, unspecified whether esophagitis present   Patient Instructions  We are going to get a chest CT to evaluate the mild changes noted on your chest x-ray.  We are going to get breathing tests this will let us  know how your lungs are functioning.  You have a prescription of omeprazole  sent to your pharmacy.  This will not interfere with the Latuda  but will help with your reflux symptoms and heartburn.  You are getting a trial of Trelegy Ellipta  this is an inhaler that you will use once a day.  Make sure you rinse your mouth well after you use it with some baking soda in the water.  Do not use Symbicort  while using the Trelegy.  If the Trelegy works well for you let us  know so that we can send a prescription to your pharmacy.  Please note: late entry documentation due to logistical difficulties during COVID-19 pandemic. This note is filed for information purposes only, and is not intended to be used for billing, nor does it represent the full scope/nature of the visit in question. Please see any associated scanned media linked to date of encounter for additional pertinent information.  Subjective:    HPI: David Phillips is a 34 y.o. male presenting to the pulmonology clinic on 01/08/2020 with report of: PULMONARY CONSULT (per Damien Bare, NP--CXR 08/10/2019. c/o sob with exertion, prod cough with black to greenish mucus, left side chest discomfort under breast line and occ wheezing.)     Outpatient Encounter Medications as of 01/08/2020  Medication Sig   betamethasone valerate (VALISONE) 0.1 % cream    [DISCONTINUED] albuterol  (VENTOLIN  HFA) 108 (90 Base) MCG/ACT inhaler Inhale 2 puffs into the lungs every 4 (four) hours as needed for wheezing or shortness of breath (coughing fits).   [DISCONTINUED]  atorvastatin  (LIPITOR) 20 MG tablet TAKE 1 TABLET(20 MG) BY MOUTH DAILY   [DISCONTINUED] budesonide -formoterol  (SYMBICORT ) 80-4.5 MCG/ACT inhaler Inhale 2 puffs into the lungs in the morning and at bedtime.   [DISCONTINUED] cetirizine  (ZYRTEC ) 10 MG tablet Take 1 tablet (10 mg total) by mouth at bedtime. (Patient not taking: Reported on 07/23/2020)   [DISCONTINUED] ELIDEL 1 % cream APPLY ON THE AFFECTED SKIN D (Patient not taking: Reported on 07/23/2020)   [DISCONTINUED] fluticasone  (FLONASE ) 50 MCG/ACT nasal spray Place 2 sprays into both nostrils daily. (Patient not taking: Reported on 07/23/2020)   [DISCONTINUED] hydrOXYzine  (ATARAX /VISTARIL ) 50 MG tablet Take 1 tablet (50 mg total) by mouth 3 (three) times daily as needed. (Patient taking differently: Take 50 mg by mouth every 6 (six) hours as needed.)   [DISCONTINUED] losartan  (COZAAR ) 25 MG tablet TAKE 1 TABLET(25 MG) BY MOUTH DAILY   [DISCONTINUED] Lurasidone  HCl 120 MG TABS Take 1 tablet by mouth daily.   [DISCONTINUED] meloxicam  (MOBIC ) 15 MG tablet TAKE 1 TABLET(15 MG) BY MOUTH DAILY   [DISCONTINUED] nicotine  (NICODERM CQ  - DOSED IN MG/24 HOURS) 21 mg/24hr patch Place 1 patch (21 mg total) onto the skin daily.   [DISCONTINUED] promethazine -dextromethorphan (PROMETHAZINE -DM) 6.25-15 MG/5ML syrup Take 5 mLs by mouth 4 (four) times daily as needed for cough.   [EXPIRED] Fluticasone -Umeclidin-Vilant (TRELEGY ELLIPTA ) 100-62.5-25 MCG/INH AEPB Inhale 1 puff into the lungs daily for 1 day.   [DISCONTINUED] omeprazole  (PRILOSEC) 40 MG capsule Take 1 capsule (40 mg total) by mouth daily.   No facility-administered  encounter medications on file as of 01/08/2020.      Objective:   Vitals:   01/08/20 1510  BP: (!) 160/98  Pulse: 89  Temp: 98.9 F (37.2 C)  Height: 5' 8 (1.727 m)  Weight: 172 lb 3.2 oz (78.1 kg)  SpO2: 98%  TempSrc: Temporal  BMI (Calculated): 26.19     Physical exam documentation is limited by delayed entry of information.

## 2020-01-08 NOTE — Patient Instructions (Signed)
We are going to get a chest CT to evaluate the mild changes noted on your chest x-ray.  We are going to get breathing tests this will let us know how your lungs are functioning.  You have a prescription of omeprazole sent to your pharmacy.  This will not interfere with the Latuda but will help with your reflux symptoms and heartburn.  You are getting a trial of Trelegy Ellipta this is an inhaler that you will use once a day.  Make sure you rinse your mouth well after you use it with some baking soda in the water.  Do not use Symbicort while using the Trelegy.  If the Trelegy works well for you let us know so that we can send a prescription to your pharmacy.

## 2020-01-09 ENCOUNTER — Other Ambulatory Visit: Payer: Self-pay

## 2020-01-09 DIAGNOSIS — E782 Mixed hyperlipidemia: Secondary | ICD-10-CM

## 2020-01-09 MED ORDER — ATORVASTATIN CALCIUM 20 MG PO TABS: 20.0000 mg | ORAL_TABLET | Freq: Every day | ORAL | 0 refills | Status: DC

## 2020-01-10 ENCOUNTER — Telehealth: Payer: Self-pay

## 2020-01-10 NOTE — Telephone Encounter (Signed)
Unfortunately with PFTs I will not be able to provide him with more information with regards to his lung issues.

## 2020-01-10 NOTE — Telephone Encounter (Signed)
Physician is aware. Rhonda J Cobb

## 2020-01-10 NOTE — Telephone Encounter (Signed)
Spoke to pt and relayed date/time of covid test.  Pt did not wish to have covid test, as he feels that he will get covid from getting tested.  Pt has been educated on this matter, but still declined. Pt is aware that PFT will need to be canceled, as covid test is required. Pt wished to cancel PFT at this time. Rhonda, please advise.  Will route to Dr. Jayme Cloud as an Lorain Childes.

## 2020-01-10 NOTE — Telephone Encounter (Signed)
Called and spoke with David Phillips in Cardiopulmonary and canceled PFT. Msg sent to [place] to cancel the Covid Test.  Phone message routed to Dr. Jayme Cloud as Lorain Childes. Rhonda J Cobb

## 2020-01-13 ENCOUNTER — Other Ambulatory Visit: Payer: Medicare Other

## 2020-01-13 ENCOUNTER — Other Ambulatory Visit: Payer: Self-pay

## 2020-01-13 ENCOUNTER — Telehealth: Payer: Medicare Other | Admitting: Family Medicine

## 2020-01-13 ENCOUNTER — Telehealth (INDEPENDENT_AMBULATORY_CARE_PROVIDER_SITE_OTHER): Payer: Medicare Other | Admitting: Family Medicine

## 2020-01-13 DIAGNOSIS — Z5329 Procedure and treatment not carried out because of patient's decision for other reasons: Secondary | ICD-10-CM

## 2020-01-13 DIAGNOSIS — Z91199 Patient's noncompliance with other medical treatment and regimen due to unspecified reason: Secondary | ICD-10-CM

## 2020-01-13 NOTE — Progress Notes (Signed)
Called patient several times, no answer - visit not completed

## 2020-01-14 ENCOUNTER — Ambulatory Visit: Payer: Medicare Other

## 2020-01-20 ENCOUNTER — Other Ambulatory Visit: Payer: Self-pay

## 2020-01-20 ENCOUNTER — Telehealth: Payer: Medicare Other | Admitting: Family Medicine

## 2020-01-20 ENCOUNTER — Ambulatory Visit
Admission: RE | Admit: 2020-01-20 | Discharge: 2020-01-20 | Disposition: A | Discharge status: Home / Self Care | Payer: Medicare Other | Source: Ambulatory Visit | Attending: Pulmonary Disease | Admitting: Pulmonary Disease

## 2020-01-20 DIAGNOSIS — J849 Interstitial pulmonary disease, unspecified: Secondary | ICD-10-CM | POA: Insufficient documentation

## 2020-01-20 LAB — POCT I-STAT CREATININE: Creatinine, Ser: 1 mg/dL (ref 0.61–1.24)

## 2020-01-20 MED ORDER — IOHEXOL 300 MG/ML  SOLN
75.0000 mL | Freq: Once | INTRAMUSCULAR | Status: AC | PRN
  Administered 2020-01-20: 75 mL via INTRAVENOUS

## 2020-01-29 ENCOUNTER — Encounter: Payer: Self-pay | Admitting: Family Medicine

## 2020-01-29 ENCOUNTER — Telehealth (INDEPENDENT_AMBULATORY_CARE_PROVIDER_SITE_OTHER): Payer: Medicare Other | Admitting: Family Medicine

## 2020-01-29 VITALS — Ht 69.0 in | Wt 170.0 lb

## 2020-01-29 DIAGNOSIS — I10 Essential (primary) hypertension: Secondary | ICD-10-CM | POA: Diagnosis not present

## 2020-01-29 DIAGNOSIS — M25531 Pain in right wrist: Secondary | ICD-10-CM | POA: Diagnosis not present

## 2020-01-29 DIAGNOSIS — K219 Gastro-esophageal reflux disease without esophagitis: Secondary | ICD-10-CM | POA: Diagnosis not present

## 2020-01-29 DIAGNOSIS — E782 Mixed hyperlipidemia: Secondary | ICD-10-CM | POA: Diagnosis not present

## 2020-01-29 DIAGNOSIS — M25532 Pain in left wrist: Secondary | ICD-10-CM

## 2020-01-29 MED ORDER — MELOXICAM 15 MG PO TABS: ORAL_TABLET | ORAL | 0 refills | Status: DC

## 2020-01-29 MED ORDER — ATORVASTATIN CALCIUM 20 MG PO TABS: 20.0000 mg | ORAL_TABLET | Freq: Every day | ORAL | 0 refills | Status: DC

## 2020-01-29 NOTE — Patient Instructions (Signed)
Take losartan 50 mg daily and follow up in office in the next week (take two pills)
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
## 2020-02-19 ENCOUNTER — Other Ambulatory Visit: Payer: Self-pay | Admitting: Family Medicine

## 2020-02-19 DIAGNOSIS — E782 Mixed hyperlipidemia: Secondary | ICD-10-CM

## 2020-03-10 ENCOUNTER — Other Ambulatory Visit: Payer: Self-pay

## 2020-03-10 ENCOUNTER — Telehealth: Payer: Medicare Other | Admitting: Family Medicine

## 2020-03-10 ENCOUNTER — Encounter: Payer: Self-pay | Admitting: Pulmonary Disease

## 2020-03-10 ENCOUNTER — Ambulatory Visit (INDEPENDENT_AMBULATORY_CARE_PROVIDER_SITE_OTHER): Payer: Medicare Other | Admitting: Pulmonary Disease

## 2020-03-10 DIAGNOSIS — F17210 Nicotine dependence, cigarettes, uncomplicated: Secondary | ICD-10-CM | POA: Diagnosis not present

## 2020-03-10 DIAGNOSIS — K219 Gastro-esophageal reflux disease without esophagitis: Secondary | ICD-10-CM

## 2020-03-10 DIAGNOSIS — J849 Interstitial pulmonary disease, unspecified: Secondary | ICD-10-CM

## 2020-03-10 DIAGNOSIS — J449 Chronic obstructive pulmonary disease, unspecified: Secondary | ICD-10-CM | POA: Diagnosis not present

## 2020-03-10 NOTE — Progress Notes (Signed)
Subjective:    Patient ID: David Phillips, male    DOB: Aug 29, 1985, 34 y.o.   MRN: [id number] Virtual Visit Via Video or Telephone Note:   This visit type was conducted due to national recommendations for restrictions regarding the COVID-19 pandemic .  This format is felt to be most appropriate for this patient at this time.  All issues noted in this document were discussed and addressed.  No physical exam was performed (except for noted visual exam findings with Video Visits).   I connected with Ella Bodo by  telephone at 9:40 AM and verified that I was speaking with the correct person using two identifiers. Location patient: home Location provider: [facility] Persons participating in the virtual visit: patient, physician   I discussed the limitations, risks, security and privacy concerns of performing an evaluation and management service by video and the availability of in person appointments. The patient expressed understanding and agreed to proceed. Patient actually requested visit be transitioned to televisit due to inability to get a ride to come in the office today.  HPI Patient is a 34 year old current smoker (2 packs/day) whom we are following with with regards to fire abnormal findings on chest x-ray.  Patient was initially seen on 08 January 2020.  PFTs and a chest CT were ordered at that time after chest x-ray performed in February 2021 showed "subtle interstitial prominence.  The patient continues to smoke 2 packs of cigarettes a day.  He has failed many options of quitting smoking and states also that anxiety prevents him from quitting.  I have advised him to discuss with his mental health professional the possibilities to help quit smoking as he is on numerous psychiatric medications.  Patient has issues with gastroesophageal reflux that are well controlled with omeprazole.  We provided scription for this medication to him on his initial visit.  Further refills may  come from his primary care practitioner.  The patient refused to have the PFTs ordered as he did not want to have a Covid test performed.  He states that he "does not want anything to do with Covid".  At the same time he wonders if he has significant lung disease and I have advised him that I cannot make that determination as I do not have measurement of his lung function.  Chest CT on 20 January 2020 was consistent with smokers bronchiolitis, I discussed the findings with the patient.  Smokers bronchiolitis (RB ILD) is treated by smoking cessation.  This was discussed with the patient.  Review of Systems A 10 point review of systems was performed and it is as noted above otherwise negative.    Objective:   Physical Exam  No physical exam performed as the visit was via telephone.  Patient did not exhibit conversational dyspnea during the visit.      Assessment & Plan:     ICD-10-CM   1. 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 COPD suggested by initial evaluation [facility])  J44.9    Patient is on no inhalers except for as needed albuterol Has declined in his inhalers  3. Gastroesophageal reflux disease, unspecified whether esophagitis present  K21.9    Doing well on omeprazole Continue the same  4. Tobacco dependence due to cigarettes  F17.210    Patient counseled regards discontinuation of smoking   We will see  the patient in follow-up in 6 months time he is to contact us prior to that time should any new difficulties arise.    Total visit time 18 minutes.  Gailen Shelter, MD [facility]   *This note was dictated using voice recognition software/Dragon.  Despite best efforts to proofread, errors can occur which can change the meaning.  Any change was purely unintentional.

## 2020-03-10 NOTE — Patient Instructions (Addendum)
We will see you in follow-up in 6 months time.  Call sooner if any new problems arise.

## 2020-03-24 ENCOUNTER — Ambulatory Visit (INDEPENDENT_AMBULATORY_CARE_PROVIDER_SITE_OTHER): Payer: Medicare Other

## 2020-03-24 DIAGNOSIS — Z748 Other problems related to care provider dependency: Secondary | ICD-10-CM | POA: Diagnosis not present

## 2020-03-24 DIAGNOSIS — Z0000 Encounter for general adult medical examination without abnormal findings: Secondary | ICD-10-CM

## 2020-03-24 NOTE — Progress Notes (Signed)
Subjective:   David Phillips is a 34 y.o. male who presents for an Initial Medicare Annual Wellness Visit.  Virtual Visit via Telephone Note  I connected with  David Phillips on 03/24/20 at 10:40 AM EDT by telephone and verified that I am speaking with the correct person using two identifiers.  Medicare Annual Wellness visit completed telephonically due to Covid-19 pandemic.   Location: Patient: home Provider: [facility]   I discussed the limitations, risks, security and privacy concerns of performing an evaluation and management service by telephone and the availability of in person appointments. The patient expressed understanding and agreed to proceed.  Unable to perform video visit due to video visit attempted and failed and/or patient does not have video capability.   Some vital signs may be absent or patient reported.   Reather Littler, LPN    Review of Systems     Cardiac Risk Factors include: advanced age (>25men, >31 women)     Objective:    Today's Vitals   03/24/20 1045  PainSc: 7    There is no height or weight on file to calculate BMI.  Advanced Directives 03/24/2020 12/01/2019 03/06/2019 11/30/2018 04/23/2018 04/22/2018 02/21/2018  Does Patient Have a Medical Advance Directive? No No No No No No No  Would patient like information on creating a medical advance directive? No - Patient declined No - Patient declined No - Patient declined No - Guardian declined No - Patient declined - No - Patient declined    Current Medications (verified) Outpatient Encounter Medications as of 03/24/2020  Medication Sig  . albuterol (VENTOLIN HFA) 108 (90 Base) MCG/ACT inhaler Inhale 2 puffs into the lungs every 4 (four) hours as needed for wheezing or shortness of breath (coughing fits).  Marland Kitchen atorvastatin (LIPITOR) 20 MG tablet TAKE 1 TABLET(20 MG) BY MOUTH DAILY  . betamethasone valerate (VALISONE) 0.1 % cream APP AA ON HANDS D DURING DIFFICULT FLARES  . ELIDEL 1 % cream APPLY ON THE AFFECTED  SKIN D  . fluticasone (FLONASE) 50 MCG/ACT nasal spray Place 2 sprays into both nostrils daily.  . hydrOXYzine (ATARAX/VISTARIL) 50 MG tablet Take 1 tablet (50 mg total) by mouth 3 (three) times daily as needed. (Patient taking differently: Take 50 mg by mouth every 6 (six) hours as needed. )  . losartan (COZAAR) 25 MG tablet TAKE 1 TABLET(25 MG) BY MOUTH DAILY  . Lurasidone HCl (LATUDA) 120 MG TABS Take 1 tablet by mouth daily.  . meloxicam (MOBIC) 15 MG tablet TAKE 1 TABLET(15 MG) BY MOUTH DAILY PRN for joint pain  . omeprazole (PRILOSEC) 40 MG capsule Take 1 capsule (40 mg total) by mouth daily.  . cetirizine (ZYRTEC) 10 MG tablet Take 1 tablet (10 mg total) by mouth at bedtime. (Patient not taking: Reported on 03/24/2020)   No facility-administered encounter medications on file as of 03/24/2020.    Allergies (verified) Prednisone   History: Past Medical History:  Diagnosis Date  . Essential hypertension   . Gastric ulcer   . Hyperlipidemia   . Schizoaffective disorder (HCC)    History reviewed. No pertinent surgical history. Family History  Problem Relation Age of Onset  . Diabetes Mother   . Diabetes Father   . Heart attack Father 20       Bypass surgeries; 3 MI's  . Schizophrenia Other   . Heart attack Paternal Grandfather 32  . Other Neg Hx    Social History   Socioeconomic History  . Marital status: Single  Spouse name: Not on file  . Number of children: 0  . Years of education: Not on file  . Highest education level: Not on file  Occupational History  . Occupation: looking for a Administrator, sports job  . Occupation: disability  Tobacco Use  . Smoking status: Current Every Day Smoker    Packs/day: 2.00    Years: 20.00    Pack years: 40.00    Types: Cigarettes    Start date: 49  . Smokeless tobacco: Former Neurosurgeon  . Tobacco comment: currently at 1 pack daily--01/08/2020; has tried chantix, patches and gum without success  Vaping Use  . Vaping Use: Former  Substance  and Sexual Activity  . Alcohol use: Yes    Alcohol/week: 6.0 standard drinks    Types: 6 Cans of beer per week    Comment: two 40 oz beers per night  . Drug use: Yes    Comment: h/o drug use in the past.  . Sexual activity: Not Currently  Other Topics Concern  . Not on file  Social History Narrative  . Not on file   Social Determinants of Health   Financial Resource Strain: Medium Risk  . Difficulty of Paying Living Expenses: Somewhat hard  Food Insecurity: No Food Insecurity  . Worried About Programme researcher, broadcasting/film/video in the Last Year: Never true  . Ran Out of Food in the Last Year: Never true  Transportation Needs: Unmet Transportation Needs  . Lack of Transportation (Medical): Yes  . Lack of Transportation (Non-Medical): Yes  Physical Activity: Inactive  . Days of Exercise per Week: 0 days  . Minutes of Exercise per Session: 0 min  Stress: Stress Concern Present  . Feeling of Stress : Rather much  Social Connections: Unknown  . Frequency of Communication with Friends and Family: Not on file  . Frequency of Social Gatherings with Friends and Family: Not on file  . Attends Religious Services: Not on file  . Active Member of Clubs or Organizations: Not on file  . Attends Banker Meetings: Not on file  . Marital Status: Never married    Tobacco Counseling Ready to quit: Not Answered Counseling given: Not Answered Comment: currently at 1 pack daily--01/08/2020; has tried chantix, patches and gum without success   Clinical Intake:  Pre-visit preparation completed: Yes  Pain : 0-10 Pain Score: 7  Pain Type: Chronic pain Pain Location: Knee Pain Orientation: Left, Right Pain Descriptors / Indicators: Aching, Discomfort, Sore Pain Onset: More than a month ago Pain Frequency: Constant     Nutritional Status: BMI 25 -29 Overweight Nutritional Risks: Nausea/ vomitting/ diarrhea (related to alcohol and coffee consumption) Diabetes: No  How often do you need to  have someone help you when you read instructions, pamphlets, or other written materials from your doctor or pharmacy?: 1 - Never    Interpreter Needed?: No  Information entered by :: Reather Littler LPN   Activities of Daily Living In your present state of health, do you have any difficulty performing the following activities: 03/24/2020 01/29/2020  Hearing? N N  Comment declines hearing aids -  Vision? N N  Difficulty concentrating or making decisions? N N  Walking or climbing stairs? N N  Dressing or bathing? N N  Doing errands, shopping? N N  Preparing Food and eating ? N -  Using the Toilet? N -  In the past six months, have you accidently leaked urine? N -  Do you have problems with loss of bowel  control? N -  Managing your Medications? N -  Managing your Finances? N -  Housekeeping or managing your Housekeeping? N -  Some recent data might be hidden    Patient Care Team: Danelle Berryapia, [name], PA-C as PCP - General (Family Medicine) Antonieta IbaGollan, Timothy J, MD as PCP - Cardiology (Cardiology)  Indicate any recent Medical Services you may have received from other than [facility] providers in the past year (date may be approximate).     Assessment:   This is a routine wellness examination for David Phillips.  Hearing/Vision screen  Hearing Screening   125Hz  250Hz  500Hz  1000Hz  2000Hz  3000Hz  4000Hz  6000Hz  8000Hz   Right ear:           Left ear:           Comments: Pt denies hearing difficulty  Vision Screening Comments: [facility]  Dietary issues and exercise activities discussed: Current Exercise Habits: The patient does not participate in regular exercise at present, Exercise limited by: orthopedic condition(s)  Goals    . DIET - INCREASE WATER INTAKE     Recommend drinking 6-8 glasses of water per day       Depression Screen PHQ 2/9 Scores 03/24/2020 01/29/2020 01/13/2020 08/09/2019 07/08/2019 05/09/2019 11/27/2018  PHQ - 2 Score 1 1 0 0 1 0 0  PHQ- 9 Score  3 1 0 1 2 0 0    Fall Risk Fall Risk  03/24/2020 01/29/2020 01/13/2020 08/09/2019 07/08/2019  Falls in the past year? 0 0 1 0 0  Number falls in past yr: 0 0 0 0 -  Injury with Fall? 0 0 0 0 0  Risk for fall due to : No Fall Risks - - - -  Follow up Falls prevention discussed - - - -    Any stairs in or around the home? No  If so, are there any without handrails? No  Home free of loose throw rugs in walkways, pet beds, electrical cords, etc? Yes  Adequate lighting in your home to reduce risk of falls? Yes   ASSISTIVE DEVICES UTILIZED TO PREVENT FALLS:  Life alert? No  Use of a cane, walker or w/c? No  Grab bars in the bathroom? No  Shower chair or bench in shower? No  Elevated toilet seat or a handicapped toilet? No   TIMED UP AND GO:  Was the test performed? No . Telephonic visit.   Cognitive Function: 6CIT deferred for 2021 AWV; pt states no memory issues        Immunizations Immunization History  Administered Date(s) Administered  . Influenza,inj,Quad PF,6+ Mos 02/12/2018, 05/09/2019  . Influenza-Unspecified 03/30/2017  . Tdap 02/12/2018    TDAP status: Up to date   Flu Vaccine status: Declined, Education has been provided regarding the importance of this vaccine but patient still declined. Advised may receive this vaccine at local pharmacy or Health Dept. Aware to provide a copy of the vaccination record if obtained from local pharmacy or Health Dept. Verbalized acceptance and understanding.   Pneumococcal vaccine: due at age 35  Covid-19 vaccine status: Declined, Education has been provided regarding the importance of this vaccine but patient still declined. Advised may receive this vaccine at local pharmacy or Health Dept.or vaccine clinic. Aware to provide a copy of the vaccination record if obtained from local pharmacy or Health Dept. Verbalized acceptance and understanding.  Qualifies for Shingles Vaccine? No  due at age 34  Screening Tests Health Maintenance    Topic  Date Due  . INFLUENZA VACCINE  01/19/2020  . COVID-19 Vaccine (1) 04/09/2020 (Originally 03/27/1997)  . TETANUS/TDAP  02/13/2028  . Hepatitis C Screening  Completed  . HIV Screening  Completed    Health Maintenance  Health Maintenance Due  Topic Date Due  . INFLUENZA VACCINE  01/19/2020   Colorectal cancer screening: due at age 89  Lung Cancer Screening: (Low Dose CT Chest recommended if Age 20-80 years, 30 pack-year currently smoking OR have quit w/in 15years.) does not qualify.   Additional Screening:  Hepatitis C Screening: does qualify; Completed 08/13/18  Vision Screening: Recommended annual ophthalmology exams for early detection of glaucoma and other disorders of the eye. Is the patient up to date with their annual eye exam?  Yes  Who is the provider or what is the name of the office in which the patient attends annual eye exams? [facility]  Dental Screening: Recommended annual dental exams for proper oral hygiene  Community Resource Referral / Chronic Care Management: CRR required this visit?  yes - transportation  CCM required this visit?  No      Plan:     I have personally reviewed and noted the following in the patient's chart:   . Medical and social history . Use of alcohol, tobacco or illicit drugs  . Current medications and supplements . Functional ability and status . Nutritional status . Physical activity . Advanced directives . List of other physicians . Hospitalizations, surgeries, and ER visits in previous 12 months . Vitals . Screenings to include cognitive, depression, and falls . Referrals and appointments  In addition, I have reviewed and discussed with patient certain preventive protocols, quality metrics, and best practice recommendations. A written personalized care plan for preventive services as well as general preventive health recommendations were provided to patient.     Reather Littler, LPN   08/20/1222   Nurse Notes:  Patient interested in quitting smoking and would like to discuss options with [name] PAC; has tried patches, gum and chantix before without success. Pt advised to schedule appt.

## 2020-03-24 NOTE — Patient Instructions (Signed)
[name] , Thank you for taking time to come for your Medicare Wellness Visit. I appreciate your ongoing commitment to your health goals. Please review the following plan we discussed and let me know if I can assist you in the future.   Screening recommendations/referrals: Colonoscopy: due at age 35 Recommended yearly ophthalmology/optometry visit for glaucoma screening and checkup Recommended yearly dental visit for hygiene and checkup  Vaccinations: Influenza vaccine: due Pneumococcal vaccine: due at age 65 Tdap vaccine: done 02/12/18 Shingles vaccine: due at age 16   Covid-19: declined  Advanced directives: Advance directive discussed with you today. Even though you declined this today please call our office should you change your mind and we can give you the proper paperwork for you to fill out.  Conditions/risks identified: If you wish to quit smoking, help is available. For free tobacco cessation program offerings call the [facility] at (339) 222-2293 or Live Well Line at 819-066-1586. You may also visit [email or web address] or email [email or web address] for more information on other programs.    Next appointment: Follow up in one year for your annual wellness visit   Preventive Care 40-64 Years, Male Preventive care refers to lifestyle choices and visits with your health care provider that can promote health and wellness. What does preventive care include?  A yearly physical exam. This is also called an annual well check.  Dental exams once or twice a year.  Routine eye exams. Ask your health care provider how often you should have your eyes checked.  Personal lifestyle choices, including:  Daily care of your teeth and gums.  Regular physical activity.  Eating a healthy diet.  Avoiding tobacco and drug use.  Limiting alcohol use.  Practicing safe sex.  Taking low-dose aspirin every day starting at age 36. What happens during an annual  well check? The services and screenings done by your health care provider during your annual well check will depend on your age, overall health, lifestyle risk factors, and family history of disease. Counseling  Your health care provider may ask you questions about your:  Alcohol use.  Tobacco use.  Drug use.  Emotional well-being.  Home and relationship well-being.  Sexual activity.  Eating habits.  Work and work Astronomer. Screening  You may have the following tests or measurements:  Height, weight, and BMI.  Blood pressure.  Lipid and cholesterol levels. These may be checked every 5 years, or more frequently if you are over 57 years old.  Skin check.  Lung cancer screening. You may have this screening every year starting at age 75 if you have a 30-pack-year history of smoking and currently smoke or have quit within the past 15 years.  Fecal occult blood test (FOBT) of the stool. You may have this test every year starting at age 31.  Flexible sigmoidoscopy or colonoscopy. You may have a sigmoidoscopy every 5 years or a colonoscopy every 10 years starting at age 89.  Prostate cancer screening. Recommendations will vary depending on your family history and other risks.  Hepatitis C blood test.  Hepatitis B blood test.  Sexually transmitted disease (STD) testing.  Diabetes screening. This is done by checking your blood sugar (glucose) after you have not eaten for a while (fasting). You may have this done every 1-3 years. Discuss your test results, treatment options, and if necessary, the need for more tests with your health care provider. Vaccines  Your health care provider may recommend certain vaccines, such as:  Influenza  vaccine. This is recommended every year.  Tetanus, diphtheria, and acellular pertussis (Tdap, Td) vaccine. You may need a Td booster every 10 years.  Zoster vaccine. You may need this after age 66.  Pneumococcal 13-valent conjugate (PCV13)  vaccine. You may need this if you have certain conditions and have not been vaccinated.  Pneumococcal polysaccharide (PPSV23) vaccine. You may need one or two doses if you smoke cigarettes or if you have certain conditions. Talk to your health care provider about which screenings and vaccines you need and how often you need them. This information is not intended to replace advice given to you by your health care provider. Make sure you discuss any questions you have with your health care provider. Document Released: 07/03/2015 Document Revised: 02/24/2016 Document Reviewed: 04/07/2015 Elsevier Interactive Patient Education  2017 [employer].  Fall Prevention in the Home Falls can cause injuries. They can happen to people of all ages. There are many things you can do to make your home safe and to help prevent falls. What can I do on the outside of my home?  Regularly fix the edges of walkways and driveways and fix any cracks.  Remove anything that might make you trip as you walk through a door, such as a raised step or threshold.  Trim any bushes or trees on the path to your home.  Use bright outdoor lighting.  Clear any walking paths of anything that might make someone trip, such as rocks or tools.  Regularly check to see if handrails are loose or broken. Make sure that both sides of any steps have handrails.  Any raised decks and porches should have guardrails on the edges.  Have any leaves, snow, or ice cleared regularly.  Use sand or salt on walking paths during winter.  Clean up any spills in your garage right away. This includes oil or grease spills. What can I do in the bathroom?  Use night lights.  Install grab bars by the toilet and in the tub and shower. Do not use towel bars as grab bars.  Use non-skid mats or decals in the tub or shower.  If you need to sit down in the shower, use a plastic, non-slip stool.  Keep the floor dry. Clean up any water that spills on  the floor as soon as it happens.  Remove soap buildup in the tub or shower regularly.  Attach bath mats securely with double-sided non-slip rug tape.  Do not have throw rugs and other things on the floor that can make you trip. What can I do in the bedroom?  Use night lights.  Make sure that you have a light by your bed that is easy to reach.  Do not use any sheets or blankets that are too big for your bed. They should not hang down onto the floor.  Have a firm chair that has side arms. You can use this for support while you get dressed.  Do not have throw rugs and other things on the floor that can make you trip. What can I do in the kitchen?  Clean up any spills right away.  Avoid walking on wet floors.  Keep items that you use a lot in easy-to-reach places.  If you need to reach something above you, use a strong step stool that has a grab bar.  Keep electrical cords out of the way.  Do not use floor polish or wax that makes floors slippery. If you must use wax, use  non-skid floor wax.  Do not have throw rugs and other things on the floor that can make you trip. What can I do with my stairs?  Do not leave any items on the stairs.  Make sure that there are handrails on both sides of the stairs and use them. Fix handrails that are broken or loose. Make sure that handrails are as long as the stairways.  Check any carpeting to make sure that it is firmly attached to the stairs. Fix any carpet that is loose or worn.  Avoid having throw rugs at the top or bottom of the stairs. If you do have throw rugs, attach them to the floor with carpet tape.  Make sure that you have a light switch at the top of the stairs and the bottom of the stairs. If you do not have them, ask someone to add them for you. What else can I do to help prevent falls?  Wear shoes that:  Do not have high heels.  Have rubber bottoms.  Are comfortable and fit you well.  Are closed at the toe. Do not  wear sandals.  If you use a stepladder:  Make sure that it is fully opened. Do not climb a closed stepladder.  Make sure that both sides of the stepladder are locked into place.  Ask someone to hold it for you, if possible.  Clearly mark and make sure that you can see:  Any grab bars or handrails.  First and last steps.  Where the edge of each step is.  Use tools that help you move around (mobility aids) if they are needed. These include:  Canes.  Walkers.  Scooters.  Crutches.  Turn on the lights when you go into a dark area. Replace any light bulbs as soon as they burn out.  Set up your furniture so you have a clear path. Avoid moving your furniture around.  If any of your floors are uneven, fix them.  If there are any pets around you, be aware of where they are.  Review your medicines with your doctor. Some medicines can make you feel dizzy. This can increase your chance of falling. Ask your doctor what other things that you can do to help prevent falls. This information is not intended to replace advice given to you by your health care provider. Make sure you discuss any questions you have with your health care provider. Document Released: 04/02/2009 Document Revised: 11/12/2015 Document Reviewed: 07/11/2014 Elsevier Interactive Patient Education  2017 [employer].

## 2020-03-30 ENCOUNTER — Encounter: Payer: Self-pay | Admitting: Internal Medicine

## 2020-03-30 ENCOUNTER — Other Ambulatory Visit: Payer: Self-pay

## 2020-03-30 ENCOUNTER — Telehealth (INDEPENDENT_AMBULATORY_CARE_PROVIDER_SITE_OTHER): Payer: Medicare Other | Admitting: Internal Medicine

## 2020-03-30 DIAGNOSIS — J00 Acute nasopharyngitis [common cold]: Secondary | ICD-10-CM

## 2020-03-30 DIAGNOSIS — R197 Diarrhea, unspecified: Secondary | ICD-10-CM

## 2020-03-30 DIAGNOSIS — I10 Essential (primary) hypertension: Secondary | ICD-10-CM

## 2020-03-30 MED ORDER — LOSARTAN POTASSIUM 25 MG PO TABS: ORAL_TABLET | ORAL | 0 refills | Status: DC

## 2020-03-30 NOTE — Progress Notes (Signed)
Name: David Phillips   MRN: [id number]    DOB: 18-Mar-1986   Date:03/30/2020       Progress Note  Subjective  Chief Complaint  Chief Complaint  Patient presents with  . Chills  . Cough  . Nausea    I connected with  Ella Bodo on 03/30/20 at 11:20 AM EDT by telephone and verified that I am speaking with the correct person using two identifiers.  I discussed the limitations, risks, security and privacy concerns of performing an evaluation and management service by telephone and the availability of in person appointments. The patient expressed understanding and agreed to proceed. Staff also discussed with the patient that there may be a patient responsible charge related to this service. Patient Location: Home Provider Location: [facility] Additional Individuals present: none  I first attempted to call the patient at 11:35 AM, and he did not answer. Did connect a later attempt at 11:48 AM  HPI  Patient is a 34 year old male patient of [name] Presents today with concerns with cough and nausea/chills for past 3-4 days  Covid vaccine - not had No marked increased cough, no production (except smoking cough) No marked SOB No fever, not feeling feverish as he denies significant chills or sweats when asked. No sore throat.  + mild congestion/mild runny nose, mucus clear, ears stopped up some No  loss of smell, loss of taste Mild N/no V No muscle aches + marked loose stools/diarrhea, few times already today, several times daily in past 3-4 days, no blood, darked since taking pepto, no abdominal pains No CP, passing out episodes Taken Pepto last two days, not take any today Stools started to be solid after taking pepto,   Comorbid conditions reviewed No asthma hx  No h/o DM,  No heart disease,  No h/o CKD Positive overweight Wt Readings from Last 3 Encounters:  01/29/20 170 lb (77.1 kg)  01/08/20 172 lb 3.2 oz (78.1 kg)  12/01/19 174 lb (78.9 kg)   Tobacco-current  everyday smoker Alcohol - 2 beers when drinks, not daily Stays in house all the time, only out to store   Needs a refill of losartan, was told to take bid by [facility] after the last visit with her in August, and he has not been doing so routinely, as he thinks his blood pressure was higher because he has been out of the medicine previously and does not need to take twice daily.  She also recommended a follow-up in the next month to reassess, and get labs done, and he has not made a follow-up visit.  I recommended he make a follow-up visit, and he noted his car is currently impounded, and he would need a ride to get here.  He is working on that presently.       Patient Active Problem List   Diagnosis Date Noted  . Chest pain 05/29/2018  . Hyperlipidemia 03/19/2018  . Chronic left hip pain 02/12/2018  . Chronic left-sided low back pain without sciatica 02/12/2018  . Neutrophilia 02/12/2018  . Schizoaffective disorder, bipolar type (HCC) 02/12/2018  . Encounter for tobacco use cessation counseling 02/12/2018  . Overweight (BMI 25.0-29.9) 02/12/2018  . Allergic rhinitis 03/23/2017  . Hypertension 02/23/2017    History reviewed. No pertinent surgical history.  Family History  Problem Relation Age of Onset  . Diabetes Mother   . Diabetes Father   . Heart attack Father 40       Bypass surgeries; 3 MI's  . Schizophrenia Other   .  Heart attack Paternal Grandfather 10  . Other Neg Hx     Social History   Tobacco Use  . Smoking status: Current Every Day Smoker    Packs/day: 2.00    Years: 20.00    Pack years: 40.00    Types: Cigarettes    Start date: 7  . Smokeless tobacco: Former Neurosurgeon  . Tobacco comment: currently at 1 pack daily--01/08/2020; has tried chantix, patches and gum without success  Substance Use Topics  . Alcohol use: Yes    Alcohol/week: 6.0 standard drinks    Types: 6 Cans of beer per week    Comment: two 40 oz beers per night     Current Outpatient  Medications:  .  albuterol (VENTOLIN HFA) 108 (90 Base) MCG/ACT inhaler, Inhale 2 puffs into the lungs every 4 (four) hours as needed for wheezing or shortness of breath (coughing fits)., Disp: 18 g, Rfl: 1 .  atorvastatin (LIPITOR) 20 MG tablet, TAKE 1 TABLET(20 MG) BY MOUTH DAILY, Disp: 90 tablet, Rfl: 0 .  betamethasone valerate (VALISONE) 0.1 % cream, APP AA ON HANDS D DURING DIFFICULT FLARES, Disp: , Rfl: 3 .  cetirizine (ZYRTEC) 10 MG tablet, Take 1 tablet (10 mg total) by mouth at bedtime., Disp: 30 tablet, Rfl: 11 .  ELIDEL 1 % cream, APPLY ON THE AFFECTED SKIN D, Disp: , Rfl: 3 .  fluticasone (FLONASE) 50 MCG/ACT nasal spray, Place 2 sprays into both nostrils daily., Disp: 16 g, Rfl: 6 .  hydrOXYzine (ATARAX/VISTARIL) 50 MG tablet, Take 1 tablet (50 mg total) by mouth 3 (three) times daily as needed. (Patient taking differently: Take 50 mg by mouth every 6 (six) hours as needed. ), Disp: 30 tablet, Rfl: 0 .  losartan (COZAAR) 25 MG tablet, TAKE 1 TABLET(25 MG) BY MOUTH DAILY, Disp: 90 tablet, Rfl: 0 .  Lurasidone HCl (LATUDA) 120 MG TABS, Take 1 tablet by mouth daily., Disp: , Rfl:  .  meloxicam (MOBIC) 15 MG tablet, TAKE 1 TABLET(15 MG) BY MOUTH DAILY PRN for joint pain, Disp: 90 tablet, Rfl: 0 .  omeprazole (PRILOSEC) 40 MG capsule, Take 1 capsule (40 mg total) by mouth daily., Disp: 30 capsule, Rfl: 3  Allergies  Allergen Reactions  . Prednisone Nausea And Vomiting    With staff assistance, above reviewed with the patient today.  ROS: As per HPI, otherwise no specific complaints on a limited and focused system review   Objective  Virtual encounter, vitals not obtained.  There is no height or weight on file to calculate BMI.  Physical Exam   Appears in NAD via conversation, no cough during our conversation Breathing: No obvious respiratory distress. Speaking in complete sentences Neurological: Pt is alert, Speech is normal Psychiatric: Patient has a normal mood, Judgment  and thought content normal.   No results found for this or any previous visit (from the past 72 hour(s)).  PHQ2/9: Depression screen [facility] 2/9 03/30/2020 03/24/2020 01/29/2020 01/13/2020 08/09/2019  Decreased Interest 0 0 0 0 0  Down, Depressed, Hopeless 0 1 1 0 0  PHQ - 2 Score 0 1 1 0 0  Altered sleeping 0 0 0 0 1  Tired, decreased energy 0 0 0 0 0  Change in appetite 0 0 0 0 0  Feeling bad or failure about yourself  0 0 0 0 0  Trouble concentrating 0 1 0 0 0  Moving slowly or fidgety/restless 0 1 0 0 0  Suicidal thoughts 0 0 0 0 0  PHQ-9 Score 0 3 1 0 1  Difficult doing work/chores - Not difficult at all Not difficult at all Not difficult at all Not difficult at all  Some recent data might be hidden   PHQ-2/9 Result reviewed  Fall Risk: Fall Risk  03/24/2020 01/29/2020 01/13/2020 08/09/2019 07/08/2019  Falls in the past year? 0 0 1 0 0  Number falls in past yr: 0 0 0 0 -  Injury with Fall? 0 0 0 0 0  Risk for fall due to : No Fall Risks - - - -  Follow up Falls prevention discussed - - - -     Assessment & Plan  1. Essential hypertension He was instructed to increase the losartan to 2 tabs daily on last visit with [name] and August, and did refill the prescription for that dose, as he noted he needs a refill as he is running out. I also strongly encouraged a follow-up visit again with [name] as she recommended after that last visit. - losartan (COZAAR) 25 MG tablet; TAKE 2 TABLETs(25 MG) BY MOUTH DAILY  Dispense: 180 tablet; Refill: 0  2. Diarrhea, unspecified type He notes he has been having frequent bowel movements that are loose for the last 3 days, with some improvement taking the Pepto, although has not taken yet today and feels like the stools are little more loose today.  Has not had blood.  No abdominal pains or fever. Noted with his ear fullness and runny nose/congestion, did feel getting a Covid test was indicated, and recommended he do so.  Also, he has not been vaccinated.   He noted he does not think it is Covid, and presently is not anxious to go get tested.  He noted concerns for possible stomach bug. Discussed that it is likely a viral entity if it is a stomach bug, and continuing to manage with Pepto-Bismol products as needed, staying well-hydrated, and very bland diet is recommended.  3. Acute rhinitis/ear congestion He has been on Flonase in the past, and can take that to help short-term. He noted in the past, he often gets bronchitis around this time a year, and often gets an antibiotic to help with this.  Noted he does not have significant bronchitis concerns presently, and why antibiotics are not best with his loose bowel movement/diarrhea concern presently.  Noted if symptoms are increasing, especially if developing more cough, any production, fevers, he needs to get tested for Covid, and follow-up.  If symptoms get more severe, and will need to be seen more emergently, and can do so in an emergency setting.  I discussed the assessment and treatment plan with the patient. The patient was provided an opportunity to ask questions and all were answered. The patient agreed with the plan and demonstrated an understanding of the instructions.  Red flags and when to present for emergency care or RTC including fevers, chest pain, shortness of breath, new/worsening/un-resolving symptoms reviewed with patient at time of visit.   The patient was advised to call back or seek an in-person evaluation if the symptoms worsen or if the condition fails to improve as anticipated.  I provided 20 minutes of non-face-to-face time during this encounter that included discussing at length patient's sx/history, pertinent pmhx, medications, treatment and follow up plan. This time also included the necessary documentation, orders, and chart review.  Jamelle Haring, MD

## 2020-03-31 ENCOUNTER — Other Ambulatory Visit: Payer: Self-pay

## 2020-03-31 ENCOUNTER — Telehealth: Payer: Self-pay

## 2020-03-31 DIAGNOSIS — J209 Acute bronchitis, unspecified: Secondary | ICD-10-CM

## 2020-03-31 NOTE — Telephone Encounter (Signed)
Copied from CRM [id number]. Topic: General - Other >> Mar 31, 2020  1:51 PM Marylen Ponto wrote: Reason for CRM: Pt stated he needs an order for labs so he can get more refills. Pt stated he is currently in quarantine for Covid but would like to get this scheduled in the near future so he can continue to get refills.

## 2020-03-31 NOTE — Addendum Note (Signed)
Addended by: Davene Costain on: 03/31/2020 01:34 PM   Modules accepted: Orders

## 2020-03-31 NOTE — Telephone Encounter (Signed)
Sent to wrong office  Copied from CRM [id number]. Topic: Quick Communication - See Telephone Encounter >> Mar 31, 2020  8:21 AM Aretta Nip wrote: CRM for notification. See Telephone encounter for: 03/31/20. Pls FU with PT. Had MyChart visit yesterday, with symptoms of chills, upset stomach, fever, not prescribed any meds. After appt got some coffee and could not taste, tried to eat lunch, could not taste, chest congestion worse just over night, got thru nite using emergency inhaler. Seems very panicky wants medication called in as progression was during the nite, fever 100 w/th tylenol every 4 hrs. Told PT clinical would be reaching out [phone]

## 2020-04-01 MED ORDER — ALBUTEROL SULFATE HFA 108 (90 BASE) MCG/ACT IN AERS: 2.0000 | INHALATION_SPRAY | RESPIRATORY_TRACT | 1 refills | Status: DC | PRN

## 2020-04-01 NOTE — Telephone Encounter (Signed)
Pt notified, he needed to be tested for covid, these are the symptoms.  In meantime he could do OTC meds if anything worsens or gets severe SOB to go to ER.  Pt was notified if needs stronger meds other than OTC he would need to schedule virtual/mychart visit.

## 2020-04-25 ENCOUNTER — Other Ambulatory Visit: Payer: Self-pay | Admitting: Family Medicine

## 2020-04-25 DIAGNOSIS — M25532 Pain in left wrist: Secondary | ICD-10-CM

## 2020-04-25 DIAGNOSIS — M25531 Pain in right wrist: Secondary | ICD-10-CM

## 2020-04-27 ENCOUNTER — Ambulatory Visit (INDEPENDENT_AMBULATORY_CARE_PROVIDER_SITE_OTHER): Payer: Medicare Other | Admitting: Internal Medicine

## 2020-04-27 ENCOUNTER — Other Ambulatory Visit: Payer: Self-pay

## 2020-04-27 ENCOUNTER — Encounter: Payer: Self-pay | Admitting: Internal Medicine

## 2020-04-27 DIAGNOSIS — B349 Viral infection, unspecified: Secondary | ICD-10-CM

## 2020-04-27 DIAGNOSIS — L989 Disorder of the skin and subcutaneous tissue, unspecified: Secondary | ICD-10-CM

## 2020-04-27 DIAGNOSIS — Z800 Family history of malignant neoplasm of digestive organs: Secondary | ICD-10-CM | POA: Diagnosis not present

## 2020-04-27 DIAGNOSIS — I10 Essential (primary) hypertension: Secondary | ICD-10-CM | POA: Diagnosis not present

## 2020-04-27 NOTE — Progress Notes (Signed)
Name: David Phillips   MRN: [id number]    DOB: 1986/04/29   Date:04/27/2020       Progress Note  Subjective  Chief Complaint  Chief Complaint  Patient presents with  . Covid Exposure    I connected with  David Phillips on 04/27/20 at 11:20 AM EST by telephone and verified that I am speaking with the correct person using two identifiers.  I discussed the limitations, risks, security and privacy concerns of performing an evaluation and management service by telephone and the availability of in person appointments. The patient expressed understanding and agreed to proceed. Staff also discussed with the patient that there may be a patient responsible charge related to this service. Patient Location: Home Provider Location: [facility] [place] Additional Individuals present: none  HPI  Patient is a 34 year old male patient of [name] Last visit with me was by phone 03/30/2020 with URI symptoms and diarrhea noted. His blood pressure medicine was refilled at that last visit, and I did recommend a follow-up with [name], as she wanted to follow-up after increasing his blood pressure medicine dose on her last visit. Follows up today   Noted after our last visit 03/30/20, his sx's worsened and lost taste and smell, talked to [name] who rec'ed he get tested and he did not go as not have a car.  He states he had all of the concerning symptoms, and is certain he would have been positive.  No documented Covid + test Covid vaccine - not had  No cough presently No marked SOB, question "a little bit" No fever,  used to and been about 1.5 - 2 weeks since had one No sore throat.  + runny nose, mucus clear, better now + h/o loss of smell, loss of taste, back about 1.5 weeks ago Mild N/no V in past, better now No muscle aches, feels mildly weak still + marked loose stools/diarrhea in the past, and now resolved No CP, passing out episodes  Taking still Vit C, zinc, D3 currently, not taking any symptomatic  medications presently.  Comorbid conditions reviewed No asthma hx  No h/o DM,  No heart disease,  No h/o CKD  Tobacco-current everyday smoker Alcohol - 2 beers when drinks, not daily  Main reason called as heard now a treatment for Covid and wanted to discuss.  He noted it was an injection therapy.  I discussed at length the indications for the antibody infusion therapy, and this is not indicated presently.  He notes he is much improved.  Last visit he noted he was told to take his blood pressure medicine bid by [name] after the last visit with her in August, and he had not been doing so routinely, as he thought his blood pressure was higher because he had been out of the medicine previously and did not need to take twice daily.  She also recommended a follow-up in the next month to reassess, and get labs done, and he had not made a follow-up visit.   Last visit, I recommended he make a follow-up visit, and he noted his car was currently impounded, and he would need a ride to get here. He has not had a follow-up appointment made since our last visit.  His mother told the patient that there is a + FH of stomach cancer and also skin cancer.  He noted a couple areas on his neck that were like pimples, and he has tried to squeeze them without any drainage success.  He notes  he cannot see them as they are on the back of his neck area.  He thinks he should have them checked, and I did agree with that.  He noted he does have a dermatologist he could see, and I noted that would be a reasonable option as well.  He also has concerns for the potential of stomach cancer with his family history, and wanted to know about test for that.  I did note that could be further discussed on his follow-up.  Patient Active Problem List   Diagnosis Date Noted  . Chest pain 05/29/2018  . Hyperlipidemia 03/19/2018  . Chronic left hip pain 02/12/2018  . Chronic left-sided low back pain without sciatica 02/12/2018  .  Neutrophilia 02/12/2018  . Schizoaffective disorder, bipolar type (HCC) 02/12/2018  . Encounter for tobacco use cessation counseling 02/12/2018  . Overweight (BMI 25.0-29.9) 02/12/2018  . Allergic rhinitis 03/23/2017  . Hypertension 02/23/2017    History reviewed. No pertinent surgical history.  Family History  Problem Relation Age of Onset  . Diabetes Mother   . Diabetes Father   . Heart attack Father 42       Bypass surgeries; 3 MI's  . Schizophrenia Other   . Heart attack Paternal Grandfather 33  . Other Neg Hx     Social History   Tobacco Use  . Smoking status: Current Every Day Smoker    Packs/day: 2.00    Years: 20.00    Pack years: 40.00    Types: Cigarettes    Start date: 43  . Smokeless tobacco: Former Neurosurgeon  . Tobacco comment: currently at 1 pack daily--01/08/2020; has tried chantix, patches and gum without success  Substance Use Topics  . Alcohol use: Yes    Alcohol/week: 6.0 standard drinks    Types: 6 Cans of beer per week    Comment: two 40 oz beers per night     Current Outpatient Medications:  .  albuterol (VENTOLIN HFA) 108 (90 Base) MCG/ACT inhaler, Inhale 2 puffs into the lungs every 4 (four) hours as needed for wheezing or shortness of breath (coughing fits)., Disp: 18 g, Rfl: 1 .  atorvastatin (LIPITOR) 20 MG tablet, TAKE 1 TABLET(20 MG) BY MOUTH DAILY, Disp: 90 tablet, Rfl: 0 .  betamethasone valerate (VALISONE) 0.1 % cream, APP AA ON HANDS D DURING DIFFICULT FLARES, Disp: , Rfl: 3 .  cetirizine (ZYRTEC) 10 MG tablet, Take 1 tablet (10 mg total) by mouth at bedtime., Disp: 30 tablet, Rfl: 11 .  ELIDEL 1 % cream, APPLY ON THE AFFECTED SKIN D, Disp: , Rfl: 3 .  fluticasone (FLONASE) 50 MCG/ACT nasal spray, Place 2 sprays into both nostrils daily., Disp: 16 g, Rfl: 6 .  hydrOXYzine (ATARAX/VISTARIL) 50 MG tablet, Take 1 tablet (50 mg total) by mouth 3 (three) times daily as needed. (Patient taking differently: Take 50 mg by mouth every 6 (six) hours  as needed. ), Disp: 30 tablet, Rfl: 0 .  losartan (COZAAR) 25 MG tablet, TAKE 2 TABLETs(25 MG) BY MOUTH DAILY, Disp: 180 tablet, Rfl: 0 .  Lurasidone HCl (LATUDA) 120 MG TABS, Take 1 tablet by mouth daily., Disp: , Rfl:  .  meloxicam (MOBIC) 15 MG tablet, TAKE 1 TABLET(15 MG) BY MOUTH DAILY PRN for joint pain, Disp: 90 tablet, Rfl: 0 .  omeprazole (PRILOSEC) 40 MG capsule, Take 1 capsule (40 mg total) by mouth daily., Disp: 30 capsule, Rfl: 3  Allergies  Allergen Reactions  . Prednisone Nausea And Vomiting  With staff assistance, above reviewed with the patient today.  ROS: As per HPI, otherwise no specific complaints on a limited and focused system review   Objective  Virtual encounter, vitals not obtained.  There is no height or weight on file to calculate BMI.  Physical Exam   Appears in NAD via conversation Breathing: No obvious respiratory distress. Speaking in complete sentences Neurological: Pt is alert, Speech is normal Psychiatric: Patient has a normal mood and affect, Judgment and thought content normal.   No results found for this or any previous visit (from the past 72 hour(s)).  PHQ2/9: Depression screen [facility] 2/9 04/27/2020 03/30/2020 03/24/2020 01/29/2020 01/13/2020  Decreased Interest 0 0 0 0 0  Down, Depressed, Hopeless 1 0 1 1 0  PHQ - 2 Score 1 0 1 1 0  Altered sleeping 0 0 0 0 0  Tired, decreased energy 0 0 0 0 0  Change in appetite 0 0 0 0 0  Feeling bad or failure about yourself  0 0 0 0 0  Trouble concentrating 0 0 1 0 0  Moving slowly or fidgety/restless 0 0 1 0 0  Suicidal thoughts 0 0 0 0 0  PHQ-9 Score 1 0 3 1 0  Difficult doing work/chores Not difficult at all - Not difficult at all Not difficult at all Not difficult at all  Some recent data might be hidden   PHQ-2/9 Result reviewed  Fall Risk: Fall Risk  04/27/2020 03/24/2020 01/29/2020 01/13/2020 08/09/2019  Falls in the past year? 0 0 0 1 0  Number falls in past yr: 0 0 0 0 0  Injury with  Fall? 0 0 0 0 0  Risk for fall due to : - No Fall Risks - - -  Follow up - Falls prevention discussed - - -     Assessment & Plan 1. Viral syndrome Noted it is very likely he had Covid based on his history, although he does not have a positive Covid test per documentation.  Did discuss the antibody infusion treatments that are available for those with increased risk of severe disease plan are infected, and noted that this treatment option was not indicated presently.  He is much improved presently, does not have a documented Covid test previously, and the timing of onset was weeks ago.  It is encouraging that he is better presently. He asked about risk of complications, and discussed that with him as well, with him stopping to smoke being a strong recommendation to try to help lessen that risk and also help lessen risk of other morbidities.  At this time, there was no recommendations for more specific therapies related to a possible Covid illness that he is recovering from.  2. Skin lesions Did note getting a follow-up appointment to help assess these areas would be an option, and he noted he has a dermatologist that he can see and I noted that was a good option as well.  3. Family history of stomach cancer He also noted this family history, and wanting to get seen to ensure he does not have stomach cancer, as he noted his white counts have been higher in the past, and at one point he was sent to an oncologist who he stated did not have concerns for cancer at that point. Noted a follow-up visit is a good start to help assess and discuss any next steps that may be needed.  5. Primary hypertension Also noted his PCP wanted to follow-up after she  increased his blood pressure medicine dose, and that had not occurred.  Did ask that he be scheduled for a follow-up with [name] in the near future.  I discussed the assessment and treatment plan with the patient. The patient was provided an opportunity to  ask questions and all were answered. The patient agreed with the plan and demonstrated an understanding of the instructions.  Red flags and when to present for emergency care or RTC including fever >101.52F, chest pain, shortness of breath, new/worsening/un-resolving symptoms reviewed with patient at time of visit.   The patient was advised to call back or seek an in-person evaluation if the symptoms worsen or if the condition fails to improve as anticipated.  I provided 25 minutes of non-face-to-face time during this encounter that included discussing at length patient's sx/history, pertinent pmhx, medications, treatment and follow up plan. This time also included the necessary documentation, orders, and chart review.  Jamelle HaringLIFFORD D Mariano Doshi, MD

## 2020-05-05 ENCOUNTER — Other Ambulatory Visit: Payer: Self-pay | Admitting: Pulmonary Disease

## 2020-05-07 ENCOUNTER — Telehealth: Payer: Medicare Other | Admitting: Family Medicine

## 2020-05-07 ENCOUNTER — Telehealth: Payer: Self-pay

## 2020-05-07 NOTE — Telephone Encounter (Signed)
FYI

## 2020-05-07 NOTE — Telephone Encounter (Signed)
Pt had a virtual appt scheduled for today but cancelled it because you wanted the appt to be in office. I did offer him an appt for next week but he stated that he has transportation issues. He stated that his car has been impounded. Pt aware that he must come into the office. But he wanted me to inform you of why he cannot at this time

## 2020-06-27 ENCOUNTER — Other Ambulatory Visit: Payer: Self-pay | Admitting: Internal Medicine

## 2020-06-27 DIAGNOSIS — I10 Essential (primary) hypertension: Secondary | ICD-10-CM

## 2020-06-29 NOTE — Telephone Encounter (Signed)
Patient needs a in person f/u visit with his PCP [name], as she wanted to follow up after a change in his BP medicine last year and has not happened since. Did refill medicine without refills to continue as await that f/u visit.

## 2020-07-04 ENCOUNTER — Other Ambulatory Visit: Payer: Self-pay | Admitting: Internal Medicine

## 2020-07-04 DIAGNOSIS — I10 Essential (primary) hypertension: Secondary | ICD-10-CM

## 2020-07-06 ENCOUNTER — Telehealth: Payer: Self-pay | Admitting: Internal Medicine

## 2020-07-06 NOTE — Telephone Encounter (Signed)
Dr. B - Please advise.  

## 2020-07-06 NOTE — Telephone Encounter (Signed)
Im fine with switch, as long its ok with Dr.Yu. Thanks GB

## 2020-07-06 NOTE — Telephone Encounter (Signed)
Pt. Called and stated that he would like to switch providers (requested Dr. Cathie Hoops). He feels that he has several concerns that are not being addressed-in particular his family hx of stomach and skin cancer. He has complaints of "pain in the abdomen", "pain in the chest for months" and "lumps on the neck".

## 2020-07-07 NOTE — Telephone Encounter (Signed)
I am ok since we allow patient to switch provider x 1.

## 2020-07-07 NOTE — Telephone Encounter (Signed)
Please see details in message.

## 2020-07-08 NOTE — Telephone Encounter (Signed)
Done. (MD changed Former pt of Dr. B) Pt is sched to RTC on 07/10/20 for lab/MD

## 2020-07-08 NOTE — Telephone Encounter (Signed)
Patient last seen 11/2018 with f/u as needed.  Would you like to have labs drawn before you see the patient?

## 2020-07-09 NOTE — Telephone Encounter (Signed)
Dr. Cathie Hoops would like for patient to be scheduled for MD only.

## 2020-07-09 NOTE — Telephone Encounter (Signed)
Done... Labs were cx

## 2020-07-10 ENCOUNTER — Telehealth: Payer: Self-pay | Admitting: Oncology

## 2020-07-10 ENCOUNTER — Inpatient Hospital Stay: Payer: Medicare Other

## 2020-07-10 ENCOUNTER — Inpatient Hospital Stay: Payer: Medicare Other | Admitting: Oncology

## 2020-07-10 NOTE — Telephone Encounter (Signed)
07/10/2020 Left VM for pt informing him that today's appt was r/s to 07/31/20 @ 2:15. Reminded him if this date/time did not work to call back or send [employer] and we will r/s  SRW

## 2020-07-10 NOTE — Telephone Encounter (Signed)
Please contact pt to r/s appt

## 2020-07-22 ENCOUNTER — Telehealth: Payer: Self-pay

## 2020-07-22 NOTE — Telephone Encounter (Signed)
FYI

## 2020-07-22 NOTE — Telephone Encounter (Signed)
Copied from CRM [id number]. Topic: General - Inquiry >> Jul 22, 2020  7:12 AM Crist Infante wrote: Reason for CRM: pt wants [name] to know he has been having muscle pain in his right leg, right leg and now his right foot is hurting. Pt states his leg arm and leg are hurting some. Pt states he has had covid two times and he thinks this is from having covid. He just wanted [name] to know in advance.

## 2020-07-23 ENCOUNTER — Other Ambulatory Visit: Payer: Self-pay

## 2020-07-23 ENCOUNTER — Encounter: Payer: Self-pay | Admitting: Family Medicine

## 2020-07-23 ENCOUNTER — Ambulatory Visit (INDEPENDENT_AMBULATORY_CARE_PROVIDER_SITE_OTHER): Payer: Medicare Other | Admitting: Family Medicine

## 2020-07-23 VITALS — BP 128/86 | HR 90 | Temp 98.5°F | Resp 18 | Ht 69.0 in | Wt 171.3 lb

## 2020-07-23 DIAGNOSIS — F250 Schizoaffective disorder, bipolar type: Secondary | ICD-10-CM

## 2020-07-23 DIAGNOSIS — K219 Gastro-esophageal reflux disease without esophagitis: Secondary | ICD-10-CM

## 2020-07-23 DIAGNOSIS — M791 Myalgia, unspecified site: Secondary | ICD-10-CM

## 2020-07-23 DIAGNOSIS — I10 Essential (primary) hypertension: Secondary | ICD-10-CM

## 2020-07-23 DIAGNOSIS — F17200 Nicotine dependence, unspecified, uncomplicated: Secondary | ICD-10-CM

## 2020-07-23 DIAGNOSIS — J449 Chronic obstructive pulmonary disease, unspecified: Secondary | ICD-10-CM | POA: Diagnosis not present

## 2020-07-23 DIAGNOSIS — R1010 Upper abdominal pain, unspecified: Secondary | ICD-10-CM

## 2020-07-23 DIAGNOSIS — R7309 Other abnormal glucose: Secondary | ICD-10-CM

## 2020-07-23 DIAGNOSIS — E782 Mixed hyperlipidemia: Secondary | ICD-10-CM | POA: Diagnosis not present

## 2020-07-23 DIAGNOSIS — Z5181 Encounter for therapeutic drug level monitoring: Secondary | ICD-10-CM

## 2020-07-23 NOTE — Progress Notes (Signed)
Name: David Phillips   MRN: [id number]    DOB: 1985/07/07   Date:07/23/2020       Progress Note  Chief Complaint  Patient presents with  . Hypertension  . Hyperlipidemia     Subjective:   David Phillips is a 35 y.o. male, presents to clinic for routine f/up  Mood disorder/schizophrenia - off meds - has seen psych in the past, a lot of anxiety about health, also some hesitation with following medical advise - paranoia  Pt reports hes on latuda and hydroxyzine  He consulted with pulm - smoker, significant smoking hx, suspected COPD, recurrent URI viral illnesses and allergies, off nasal/AR meds, on inhaler for flares, and has rescue inhaler - per Dr. Jayme Cloud  Hyperlipidemia:  Previously prescribed statin - had declined to take in the past, states now taking? Currently treated with atorvastatin 20 mg, pt reports good med compliance Last Lipids: Lab Results  Component Value Date   CHOL 176 05/09/2019   HDL 45 05/09/2019   LDLCALC 90 05/09/2019   TRIG 284 (H) 05/09/2019   CHOLHDL 3.9 05/09/2019   - Denies: Chest pain, shortness of breath, myalgias, claudication  Hypertension:  Not on meds currently - anxious, possibly some white coat htn, elevated with heavy smoking On losartan in the past, lisinopril in the past Prescribed losartan - he took 50 mg this morning, sometimes takes 25 mg BID, not taking regularly  Pt reports poor med compliance- doesn't like meds and denies any SE.   Blood pressure today was elevated when he initially presented, better when rechecked BP Readings from Last 4 Encounters:     07/23/20 128/86  01/08/20 (!) 160/98  12/01/19 (!) 149/83   Pt denies CP, SOB, exertional sx, LE edema, palpitation, Ha's, visual disturbances, lightheadedness, hypotension, syncope. Dietary efforts for BP?   Some pains coming and going to bilateral arms and legs - going on for a few weeks, he thinks possibly since he had COVID  Muscles sore with waxing and waning episodes  of it since October  Stomach cancer to grandparent he is concerned about having cancer        Current Outpatient Medications:  .  albuterol (VENTOLIN HFA) 108 (90 Base) MCG/ACT inhaler, Inhale 2 puffs into the lungs every 4 (four) hours as needed for wheezing or shortness of breath (coughing fits)., Disp: 18 g, Rfl: 1 .  atorvastatin (LIPITOR) 20 MG tablet, TAKE 1 TABLET(20 MG) BY MOUTH DAILY, Disp: 90 tablet, Rfl: 0 .  betamethasone valerate (VALISONE) 0.1 % cream, APP AA ON HANDS D DURING DIFFICULT FLARES, Disp: , Rfl: 3 .  losartan (COZAAR) 25 MG tablet, TAKE 2 TABLETS BY MOUTH DAILY, Disp: 180 tablet, Rfl: 0 .  hydrOXYzine (ATARAX/VISTARIL) 50 MG tablet, Take 1 tablet (50 mg total) by mouth 3 (three) times daily as needed. (Patient taking differently: Take 50 mg by mouth every 6 (six) hours as needed. ), Disp: 30 tablet, Rfl: 0 .  Lurasidone HCl 120 MG TABS, Take 1 tablet by mouth daily. (Patient not taking: Reported on 07/23/2020), Disp: , Rfl:   Patient Active Problem List   Diagnosis Date Noted  . Family history of stomach cancer 04/27/2020  . Chest pain 05/29/2018  . Hyperlipidemia 03/19/2018  . Chronic left hip pain 02/12/2018  . Chronic left-sided low back pain without sciatica 02/12/2018  . Neutrophilia 02/12/2018  . Schizoaffective disorder, bipolar type (HCC) 02/12/2018  . Encounter for tobacco use cessation counseling 02/12/2018  . Overweight (BMI 25.0-29.9) 02/12/2018  .  Allergic rhinitis 03/23/2017  . Hypertension 02/23/2017    No past surgical history on file.  Family History  Problem Relation Age of Onset  . Diabetes Mother   . Diabetes Father   . Heart attack Father 59       Bypass surgeries; 3 MI's  . Schizophrenia Other   . Heart attack Paternal Grandfather 75  . Other Neg Hx     Social History   Tobacco Use  . Smoking status: Current Every Day Smoker    Packs/day: 2.00    Years: 20.00    Pack years: 40.00    Types: Cigarettes    Start date:  27  . Smokeless tobacco: Former Neurosurgeon  . Tobacco comment: currently at 1 pack daily--01/08/2020; has tried chantix, patches and gum without success  Vaping Use  . Vaping Use: Former  Substance Use Topics  . Alcohol use: Yes    Alcohol/week: 6.0 standard drinks    Types: 6 Cans of beer per week    Comment: two 40 oz beers per night  . Drug use: Yes    Comment: h/o drug use in the past.     Allergies  Allergen Reactions  . Prednisone Nausea And Vomiting    Health Maintenance  Topic Date Due  . COVID-19 Vaccine (1) 08/08/2020 (Originally 03/27/1997)  . INFLUENZA VACCINE  08/17/2020 (Originally 01/19/2020)  . TETANUS/TDAP  02/13/2028  . Hepatitis C Screening  Completed  . HIV Screening  Completed    Chart Review Today: I personally reviewed active problem list, medication list, allergies, family history, social history, health maintenance, notes from last encounter, lab results, imaging with the patient/caregiver today. Personally reviewed labs, VS flowsheets, recent visits and documentation, meds, med hx, family hx today with the pt - more than 10 min spent on chart review and chart prep today   Review of Systems  All other systems reviewed and are negative for acute change except as noted in the HPI.  Objective:   Vitals:   07/23/20 1411  BP: (!) 160/90  Pulse: 90  Resp: 18  Temp: 98.5 F (36.9 C)  TempSrc: Oral  SpO2: 97%  Weight: 171 lb 4.8 oz (77.7 kg)  Height: 5\' 9"  (1.753 m)    Body mass index is 25.3 kg/m.  Physical Exam Vitals and nursing note reviewed.  Constitutional:      General: He is not in acute distress.    Appearance: Normal appearance. He is well-developed. He is not ill-appearing, toxic-appearing or diaphoretic.     Interventions: Face mask in place.  HENT:     Head: Normocephalic and atraumatic.     Jaw: No trismus.     Right Ear: External ear normal.     Left Ear: External ear normal.  Eyes:     General: Lids are normal. No scleral  icterus.       Right eye: No discharge.        Left eye: No discharge.     Conjunctiva/sclera: Conjunctivae normal.  Neck:     Trachea: Trachea and phonation normal. No tracheal deviation.  Cardiovascular:     Rate and Rhythm: Normal rate and regular rhythm.     Pulses: Normal pulses.          Radial pulses are 2+ on the right side and 2+ on the left side.       Posterior tibial pulses are 2+ on the right side and 2+ on the left side.     Heart  sounds: Normal heart sounds. No murmur heard. No friction rub. No gallop.   Pulmonary:     Effort: Pulmonary effort is normal. No respiratory distress.     Breath sounds: Normal breath sounds. No stridor. No wheezing, rhonchi or rales.  Abdominal:     General: Bowel sounds are normal. There is no distension.     Palpations: Abdomen is soft.  Musculoskeletal:     Right lower leg: No edema.     Left lower leg: No edema.  Skin:    General: Skin is warm and dry.     Coloration: Skin is not jaundiced.     Findings: No rash.     Nails: There is no clubbing.  Neurological:     Mental Status: He is alert. Mental status is at baseline.     Cranial Nerves: No dysarthria or facial asymmetry.     Motor: No tremor or abnormal muscle tone.     Gait: Gait normal.  Psychiatric:        Mood and Affect: Affect normal. Mood is anxious. Affect is not angry or tearful.        Speech: Speech normal.        Behavior: Behavior is hyperactive. Behavior is cooperative.         Assessment & Plan:   1. Primary hypertension BP elevated initially, improved with resting in room and repeat BP - likely secondary to anxiety and hyperactivity Overall well controlled with losartan 25 mg  - COMPLETE METABOLIC PANEL WITH GFR - Urinalysis, Routine w reflex microscopic - Lipid panel  2. Mixed hyperlipidemia On lipitor 20 mg - poor med compliance Recheck labs - COMPLETE METABOLIC PANEL WITH GFR - Lipid panel  3. COPD suggested by initial evaluation [facility]) Per  pulmonary specialists  4. Schizoaffective disorder, bipolar type [facility]) Per psychiatry - on latuda and vistaril  5. Gastroesophageal reflux disease, unspecified whether esophagitis present Discussed diet efforts, H2 blockers or PPI  - COMPLETE METABOLIC PANEL WITH GFR - CBC w/Diff/Platelet  6. Current smoker Smoking cessation instruction/counseling given:  counseled patient on the dangers of tobacco use, advised patient to stop smoking, and reviewed strategies to maximize success   7. Abnormal blood sugar Screen for DM - pt eating during appointment today so random sugar may be difficult to assess DM - added on A1C - COMPLETE METABOLIC PANEL WITH GFR - Hemoglobin A1c - Urinalysis, Routine w reflex microscopic - MICROSCOPIC MESSAGE  8. Encounter for medication monitoring - COMPLETE METABOLIC PANEL WITH GFR - CBC w/Diff/Platelet - Hemoglobin A1c - CK (Creatine Kinase) - Urinalysis, Routine w reflex microscopic - Lipid panel - CK - Urinalysis, Routine w reflex microscopic - MICROSCOPIC MESSAGE  9. Myalgia - CK (Creatine Kinase) - Urinalysis, Routine w reflex microscopic - CK  10. Pain of upper abdomen Likely GERD - reassured pt we will monitor labs, added on urine screen, CBC will r/o anemia, offered hemoccult - but likely not needed if cbc normal Pt ordered pizza and was eating pizza and drinking soda during visit - reassuring, abd exam benign - COMPLETE METABOLIC PANEL WITH GFR - CBC w/Diff/Platelet - Urinalysis, Routine w reflex microscopic - MICROSCOPIC MESSAGE   Return in about 6 months (around 01/20/2021).   [name], PA-C 07/23/20 2:21 PM

## 2020-07-24 LAB — CBC WITH DIFFERENTIAL/PLATELET
Absolute Monocytes: 1307 cells/uL — ABNORMAL HIGH (ref 200–950)
Basophils Absolute: 145 cells/uL (ref 0–200)
Basophils Relative: 1.1 %
Eosinophils Absolute: 805 cells/uL — ABNORMAL HIGH (ref 15–500)
Eosinophils Relative: 6.1 %
HCT: 47.8 % (ref 38.5–50.0)
Hemoglobin: 16.9 g/dL (ref 13.2–17.1)
Lymphs Abs: 2640 cells/uL (ref 850–3900)
MCH: 34.1 pg — ABNORMAL HIGH (ref 27.0–33.0)
MCHC: 35.4 g/dL (ref 32.0–36.0)
MCV: 96.6 fL (ref 80.0–100.0)
MPV: 10.9 fL (ref 7.5–12.5)
Monocytes Relative: 9.9 %
Neutro Abs: 8303 cells/uL — ABNORMAL HIGH (ref 1500–7800)
Neutrophils Relative %: 62.9 %
Platelets: 292 10*3/uL (ref 140–400)
RBC: 4.95 10*6/uL (ref 4.20–5.80)
RDW: 12.6 % (ref 11.0–15.0)
Total Lymphocyte: 20 %
WBC: 13.2 10*3/uL — ABNORMAL HIGH (ref 3.8–10.8)

## 2020-07-24 LAB — URINALYSIS, ROUTINE W REFLEX MICROSCOPIC
Bacteria, UA: NONE SEEN /HPF
Bilirubin Urine: NEGATIVE
Glucose, UA: NEGATIVE
Hgb urine dipstick: NEGATIVE
Hyaline Cast: NONE SEEN /LPF
Leukocytes,Ua: NEGATIVE
Nitrite: NEGATIVE
Specific Gravity, Urine: 1.025 (ref 1.001–1.03)
Squamous Epithelial / HPF: NONE SEEN /HPF (ref <=5)
WBC, UA: NONE SEEN /HPF (ref 0–5)
pH: 6.5 (ref 5.0–8.0)

## 2020-07-24 LAB — COMPLETE METABOLIC PANEL WITH GFR
AG Ratio: 2 (calc) (ref 1.0–2.5)
ALT: 34 U/L (ref 9–46)
AST: 26 U/L (ref 10–40)
Albumin: 4.6 g/dL (ref 3.6–5.1)
Alkaline phosphatase (APISO): 68 U/L (ref 36–130)
BUN: 13 mg/dL (ref 7–25)
CO2: 27 mmol/L (ref 20–32)
Calcium: 9.6 mg/dL (ref 8.6–10.3)
Chloride: 101 mmol/L (ref 98–110)
Creat: 0.99 mg/dL (ref 0.60–1.35)
GFR, Est African American: 114 mL/min/{1.73_m2} (ref >=60)
GFR, Est Non African American: 98 mL/min/{1.73_m2} (ref >=60)
Globulin: 2.3 g/dL (calc) (ref 1.9–3.7)
Glucose, Bld: 114 mg/dL — ABNORMAL HIGH (ref 65–99)
Potassium: 4.1 mmol/L (ref 3.5–5.3)
Sodium: 140 mmol/L (ref 135–146)
Total Bilirubin: 0.4 mg/dL (ref 0.2–1.2)
Total Protein: 6.9 g/dL (ref 6.1–8.1)

## 2020-07-24 LAB — HEMOGLOBIN A1C
Hgb A1c MFr Bld: 5.1 % of total Hgb (ref <5.7)
Mean Plasma Glucose: 100 mg/dL
eAG (mmol/L): 5.5 mmol/L

## 2020-07-24 LAB — LIPID PANEL
Cholesterol: 154 mg/dL (ref <200)
HDL: 52 mg/dL (ref >=40)
LDL Cholesterol (Calc): 64 mg/dL (calc)
Non-HDL Cholesterol (Calc): 102 mg/dL (calc) (ref <130)
Total CHOL/HDL Ratio: 3 (calc) (ref <5.0)
Triglycerides: 319 mg/dL — ABNORMAL HIGH (ref <150)

## 2020-07-24 LAB — CK: Total CK: 132 U/L (ref 44–196)

## 2020-07-25 ENCOUNTER — Encounter: Payer: Self-pay | Admitting: Family Medicine

## 2020-07-31 ENCOUNTER — Inpatient Hospital Stay: Payer: Medicare Other | Attending: Oncology | Admitting: Oncology

## 2020-07-31 ENCOUNTER — Encounter: Payer: Self-pay | Admitting: Oncology

## 2020-07-31 ENCOUNTER — Inpatient Hospital Stay: Payer: Medicare Other

## 2020-07-31 ENCOUNTER — Other Ambulatory Visit: Payer: Self-pay

## 2020-07-31 VITALS — BP 144/94 | HR 98 | Temp 97.4°F | Resp 18 | Wt 178.2 lb

## 2020-07-31 DIAGNOSIS — D72821 Monocytosis (symptomatic): Secondary | ICD-10-CM | POA: Diagnosis not present

## 2020-07-31 DIAGNOSIS — D72829 Elevated white blood cell count, unspecified: Secondary | ICD-10-CM

## 2020-07-31 DIAGNOSIS — Z800 Family history of malignant neoplasm of digestive organs: Secondary | ICD-10-CM | POA: Diagnosis not present

## 2020-07-31 DIAGNOSIS — Z809 Family history of malignant neoplasm, unspecified: Secondary | ICD-10-CM | POA: Diagnosis not present

## 2020-07-31 DIAGNOSIS — Z7289 Other problems related to lifestyle: Secondary | ICD-10-CM | POA: Insufficient documentation

## 2020-07-31 DIAGNOSIS — R109 Unspecified abdominal pain: Secondary | ICD-10-CM

## 2020-07-31 DIAGNOSIS — R61 Generalized hyperhidrosis: Secondary | ICD-10-CM | POA: Diagnosis not present

## 2020-07-31 DIAGNOSIS — F1090 Alcohol use, unspecified, uncomplicated: Secondary | ICD-10-CM

## 2020-07-31 DIAGNOSIS — R5383 Other fatigue: Secondary | ICD-10-CM | POA: Insufficient documentation

## 2020-07-31 DIAGNOSIS — Z789 Other specified health status: Secondary | ICD-10-CM

## 2020-07-31 DIAGNOSIS — Z840 Family history of diseases of the skin and subcutaneous tissue: Secondary | ICD-10-CM | POA: Insufficient documentation

## 2020-07-31 DIAGNOSIS — F17210 Nicotine dependence, cigarettes, uncomplicated: Secondary | ICD-10-CM | POA: Diagnosis not present

## 2020-07-31 DIAGNOSIS — Z720 Tobacco use: Secondary | ICD-10-CM | POA: Diagnosis not present

## 2020-07-31 DIAGNOSIS — D729 Disorder of white blood cells, unspecified: Secondary | ICD-10-CM

## 2020-07-31 LAB — CBC WITH DIFFERENTIAL/PLATELET
Abs Immature Granulocytes: 0.03 10*3/uL (ref 0.00–0.07)
Basophils Absolute: 0.2 10*3/uL — ABNORMAL HIGH (ref 0.0–0.1)
Basophils Relative: 2 %
Eosinophils Absolute: 1 10*3/uL — ABNORMAL HIGH (ref 0.0–0.5)
Eosinophils Relative: 9 %
HCT: 45.2 % (ref 39.0–52.0)
Hemoglobin: 16.3 g/dL (ref 13.0–17.0)
Immature Granulocytes: 0 %
Lymphocytes Relative: 24 %
Lymphs Abs: 2.6 10*3/uL (ref 0.7–4.0)
MCH: 34.4 pg — ABNORMAL HIGH (ref 26.0–34.0)
MCHC: 36.1 g/dL — ABNORMAL HIGH (ref 30.0–36.0)
MCV: 95.4 fL (ref 80.0–100.0)
Monocytes Absolute: 1.1 10*3/uL — ABNORMAL HIGH (ref 0.1–1.0)
Monocytes Relative: 10 %
Neutro Abs: 6 10*3/uL (ref 1.7–7.7)
Neutrophils Relative %: 55 %
Platelets: 289 10*3/uL (ref 150–400)
RBC: 4.74 MIL/uL (ref 4.22–5.81)
RDW: 13.2 % (ref 11.5–15.5)
WBC: 10.9 10*3/uL — ABNORMAL HIGH (ref 4.0–10.5)
nRBC: 0 % (ref 0.0–0.2)

## 2020-07-31 LAB — TECHNOLOGIST SMEAR REVIEW: Plt Morphology: ADEQUATE

## 2020-07-31 LAB — LACTATE DEHYDROGENASE: LDH: 154 U/L (ref 98–192)

## 2020-07-31 NOTE — Progress Notes (Signed)
Patient here to establish care with Dr. Cathie Hoops. Transferred from Dr. Ascencion Dike. Pt had recent blood work at PCP and is concerned about some about some abnormal results.

## 2020-07-31 NOTE — Progress Notes (Signed)
Hematology/Oncology Follow Up Note [facility]  [phone] [phone]  Patient Care Team: [name], PA-C as PCP - General (Family Medicine) Antonieta Iba, MD as PCP - Cardiology (Cardiology)   Name of the patient: David Phillips  [id number]  Aug 03, 1985   REASON FOR VISIT  reestablish care for evaluation of leukocytosis, abdominal pain, family history of cancer  INTERVAL HISTORY Patient was previously seen by Dr. Donneta Romberg for leukocytosis. Patient has long history of chronic leukocytosis since at least 2013.  He has had work-up done with Dr. Donneta Romberg which showed negative BCR ABL, negative flow cytometry.  It was felt that leukocytosis most likely secondary to his smoking.  Patient also reports significant family history including stomach cancer in his grandfather.  Also grandmother had a history of skin cancer which metastasis to bones.  He also reports abdominal pain, generalized, no exacerbating or alleviating factors.  Chronic onset, ongoing symptoms for the past 1 year.  Occasionally he has drenching night sweats.  Denies any fever.  He feels some fatigue after previous Covid infection.  Denies any bowel changes, black or bloody bowel movement. He reports that his weight fluctuates between 175-185 pounds depends on his appetite.  Review of Systems  Constitutional: Positive for appetite change and fatigue. Negative for chills, fever and unexpected weight change.  HENT:   Negative for hearing loss and voice change.   Eyes: Negative for eye problems and icterus.  Respiratory: Negative for chest tightness, cough and shortness of breath.   Cardiovascular: Negative for chest pain and leg swelling.  Gastrointestinal: Positive for abdominal pain. Negative for abdominal distention.  Endocrine: Negative for hot flashes.  Genitourinary: Negative for difficulty urinating, dysuria and frequency.   Musculoskeletal: Negative for  arthralgias.  Skin: Negative for itching and rash.  Neurological: Negative for light-headedness and numbness.  Hematological: Negative for adenopathy. Does not bruise/bleed easily.  Psychiatric/Behavioral: Negative for confusion.      Allergies  Allergen Reactions  . Prednisone Nausea And Vomiting     Past Medical History:  Diagnosis Date  . Essential hypertension   . Gastric ulcer   . Hyperlipidemia   . Schizoaffective disorder (HCC)      No past surgical history on file.  Social History   Socioeconomic History  . Marital status: Single    Spouse name: Not on file  . Number of children: 0  . Years of education: Not on file  . Highest education level: Not on file  Occupational History  . Occupation: looking for a Administrator, sports job  . Occupation: disability  Tobacco Use  . Smoking status: Current Every Day Smoker    Packs/day: 2.00    Years: 20.00    Pack years: 40.00    Types: Cigarettes    Start date: 7  . Smokeless tobacco: Former Neurosurgeon  . Tobacco comment: currently at 1 pack daily--01/08/2020; has tried chantix, patches and gum without success  Vaping Use  . Vaping Use: Former  Substance and Sexual Activity  . Alcohol use: Yes    Alcohol/week: 6.0 standard drinks    Types: 6 Cans of beer per week    Comment: two 40 oz beers per night  . Drug use: Yes    Comment: h/o drug use in the past.  . Sexual activity: Not Currently  Other Topics Concern  . Not on file  Social History Narrative  . Not on file   Social Determinants of Health   Financial Resource Strain: Medium  Risk  . Difficulty of Paying Living Expenses: Somewhat hard  Food Insecurity: No Food Insecurity  . Worried About Programme researcher, broadcasting/film/video in the Last Year: Never true  . Ran Out of Food in the Last Year: Never true  Transportation Needs: Unmet Transportation Needs  . Lack of Transportation (Medical): Yes  . Lack of Transportation (Non-Medical): Yes  Physical Activity: Inactive  . Days of  Exercise per Week: 0 days  . Minutes of Exercise per Session: 0 min  Stress: Stress Concern Present  . Feeling of Stress : Rather much  Social Connections: Unknown  . Frequency of Communication with Friends and Family: Not on file  . Frequency of Social Gatherings with Friends and Family: Not on file  . Attends Religious Services: Not on file  . Active Member of Clubs or Organizations: Not on file  . Attends Banker Meetings: Not on file  . Marital Status: Never married  Intimate Partner Violence: Not At Risk  . Fear of Current or Ex-Partner: No  . Emotionally Abused: No  . Physically Abused: No  . Sexually Abused: No    Family History  Problem Relation Age of Onset  . Diabetes Mother   . Diabetes Father   . Heart attack Father 46       Bypass surgeries; 3 MI's  . Schizophrenia Other   . Heart attack Paternal Grandfather 34  . Other Neg Hx      Current Outpatient Medications:  .  albuterol (VENTOLIN HFA) 108 (90 Base) MCG/ACT inhaler, Inhale 2 puffs into the lungs every 4 (four) hours as needed for wheezing or shortness of breath (coughing fits)., Disp: 18 g, Rfl: 1 .  atorvastatin (LIPITOR) 20 MG tablet, TAKE 1 TABLET(20 MG) BY MOUTH DAILY, Disp: 90 tablet, Rfl: 0 .  betamethasone valerate (VALISONE) 0.1 % cream, APP AA ON HANDS D DURING DIFFICULT FLARES, Disp: , Rfl: 3 .  hydrOXYzine (ATARAX/VISTARIL) 50 MG tablet, Take 1 tablet (50 mg total) by mouth 3 (three) times daily as needed. (Patient taking differently: Take 50 mg by mouth every 6 (six) hours as needed.), Disp: 30 tablet, Rfl: 0 .  losartan (COZAAR) 25 MG tablet, TAKE 2 TABLETS BY MOUTH DAILY, Disp: 180 tablet, Rfl: 0 .  Lurasidone HCl 120 MG TABS, Take 1 tablet by mouth daily., Disp: , Rfl:   Physical exam:  Vitals:   07/31/20 1433  BP: (!) 144/94  Pulse: 98  Resp: 18  Temp: (!) 97.4 F (36.3 C)  Weight: 178 lb 3.2 oz (80.8 kg)   Physical Exam Constitutional:      General: He is not in acute  distress. HENT:     Head: Normocephalic and atraumatic.  Eyes:     General: No scleral icterus. Cardiovascular:     Rate and Rhythm: Normal rate and regular rhythm.     Heart sounds: Normal heart sounds.  Pulmonary:     Effort: Pulmonary effort is normal. No respiratory distress.     Breath sounds: No wheezing.  Abdominal:     General: Bowel sounds are normal. There is no distension.     Palpations: Abdomen is soft.     Comments: Epigastric and right upper quadrant tenderness  Musculoskeletal:        General: No deformity. Normal range of motion.     Cervical back: Normal range of motion and neck supple.  Skin:    General: Skin is warm and dry.     Findings: No erythema  or rash.  Neurological:     Mental Status: He is alert and oriented to person, place, and time. Mental status is at baseline.     Cranial Nerves: No cranial nerve deficit.     Coordination: Coordination normal.  Psychiatric:     Comments: Anxious     CMP Latest Ref Rng & Units 07/23/2020  Glucose 65 - 99 mg/dL 702(O)  BUN 7 - 25 mg/dL 13  Creatinine 3.78 - 5.88 mg/dL 5.02  Sodium 774 - 128 mmol/L 140  Potassium 3.5 - 5.3 mmol/L 4.1  Chloride 98 - 110 mmol/L 101  CO2 20 - 32 mmol/L 27  Calcium 8.6 - 10.3 mg/dL 9.6  Total Protein 6.1 - 8.1 g/dL 6.9  Total Bilirubin 0.2 - 1.2 mg/dL 0.4  Alkaline Phos 39 - 117 IU/L -  AST 10 - 40 U/L 26  ALT 9 - 46 U/L 34   CBC Latest Ref Rng & Units 07/31/2020  WBC 4.0 - 10.5 K/uL 10.9(H)  Hemoglobin 13.0 - 17.0 g/dL 78.6  Hematocrit 76.7 - 52.0 % 45.2  Platelets 150 - 400 K/uL 289    RADIOGRAPHIC STUDIES: I have personally reviewed the radiological images as listed and agreed with the findings in the report. No results found.   Assessment and plan  1. Leukocytosis, unspecified type   2. Abdominal pain, unspecified abdominal location   3. Family history of cancer   4. Tobacco use   5. Alcohol use    #Chronic leukocytosis, predominantly neutrophilia.  Chronic  monocytosis. Probably reactive due to smoking. Check CBC, smear, LDH, flow cytometry, EBV. #Epigastric and right upper quadrant pain.  Check ultrasound abdomen. #Family history of cancer, refer to genetic counselor. #Tobacco use, smoke cessation was discussed. #Chronic alcohol use, alcohol cessation was discussed. Orders Placed This Encounter  Procedures  . US Abdomen Complete    Standing Status:   Future    Standing Expiration Date:   07/31/2021    Order Specific Question:   Reason for Exam (SYMPTOM  OR DIAGNOSIS REQUIRED)    Answer:   abdominal pain    Order Specific Question:   Preferred imaging location?    Answer:   [facility]  . Epstein-Barr virus VCA antibody panel    Standing Status:   Future    Number of Occurrences:   1    Standing Expiration Date:   07/31/2021  . Lactate dehydrogenase    Standing Status:   Future    Number of Occurrences:   1    Standing Expiration Date:   07/31/2021  . Flow cytometry panel-leukemia/lymphoma work-up    Standing Status:   Future    Number of Occurrences:   1    Standing Expiration Date:   07/31/2021  . Technologist smear review    Standing Status:   Future    Number of Occurrences:   1    Standing Expiration Date:   07/31/2021  . CBC with Differential/Platelet    Standing Status:   Future    Number of Occurrences:   1    Standing Expiration Date:   07/31/2021  . Ambulatory referral to Genetics    Referral Priority:   Routine    Referral Type:   Consultation    Referral Reason:   Specialty Services Required    Number of Visits Requested:   1    Follow-up in 2 weeks to go over results.   Rickard Patience, MD, PhD Hematology Oncology [facility] at  [facility] Pager- [phone] 07/31/2020

## 2020-08-04 LAB — COMP PANEL: LEUKEMIA/LYMPHOMA

## 2020-08-06 ENCOUNTER — Telehealth: Payer: Self-pay

## 2020-08-06 NOTE — Telephone Encounter (Signed)
Copied from CRM [id number]. 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

## 2020-08-06 NOTE — Telephone Encounter (Signed)
He need paper  work sign that stating he is disable and cannot pay back student loans.

## 2020-08-08 ENCOUNTER — Encounter: Payer: Self-pay | Admitting: Oncology

## 2020-08-10 ENCOUNTER — Telehealth: Payer: Self-pay

## 2020-08-10 ENCOUNTER — Ambulatory Visit: Payer: Medicare Other

## 2020-08-10 DIAGNOSIS — D72829 Elevated white blood cell count, unspecified: Secondary | ICD-10-CM

## 2020-08-10 DIAGNOSIS — D729 Disorder of white blood cells, unspecified: Secondary | ICD-10-CM

## 2020-08-10 NOTE — Telephone Encounter (Signed)
FYI...  A detailed messageswas left on pts VM  Making him aware that he needed more labs drawn per MD. And to give the office a call back  So that his appt can be sched as requested. I'll try calling him again as well throughout the day.

## 2020-08-10 NOTE — Telephone Encounter (Signed)
Patient  Notified via MyChart message from 2/19 with orders for additional labs to be drawn (EBV testing Jak 2 mutation with relex and BCR ABL FISH).  Please contact patient to schedule lab appt.

## 2020-08-10 NOTE — Telephone Encounter (Signed)
Working on it.

## 2020-08-11 NOTE — Telephone Encounter (Signed)
Done.. Pt called back and requested to RTC to repeat labs on 08/14/20 @ 245

## 2020-08-12 NOTE — Telephone Encounter (Signed)
Will keep appt as is.

## 2020-08-14 ENCOUNTER — Inpatient Hospital Stay: Payer: Medicare Other

## 2020-08-14 DIAGNOSIS — D72821 Monocytosis (symptomatic): Secondary | ICD-10-CM | POA: Diagnosis not present

## 2020-08-14 DIAGNOSIS — D72829 Elevated white blood cell count, unspecified: Secondary | ICD-10-CM

## 2020-08-14 DIAGNOSIS — D72828 Other elevated white blood cell count: Secondary | ICD-10-CM

## 2020-08-14 DIAGNOSIS — D729 Disorder of white blood cells, unspecified: Secondary | ICD-10-CM

## 2020-08-17 ENCOUNTER — Encounter: Payer: Self-pay | Admitting: Oncology

## 2020-08-17 ENCOUNTER — Inpatient Hospital Stay (HOSPITAL_BASED_OUTPATIENT_CLINIC_OR_DEPARTMENT_OTHER): Payer: Medicare Other | Admitting: Oncology

## 2020-08-17 DIAGNOSIS — D72829 Elevated white blood cell count, unspecified: Secondary | ICD-10-CM | POA: Diagnosis not present

## 2020-08-17 DIAGNOSIS — Z720 Tobacco use: Secondary | ICD-10-CM

## 2020-08-17 DIAGNOSIS — Z809 Family history of malignant neoplasm, unspecified: Secondary | ICD-10-CM | POA: Diagnosis not present

## 2020-08-17 NOTE — Progress Notes (Signed)
Patient contacted for Mychart visit. No new concerns voiced.  

## 2020-08-17 NOTE — Progress Notes (Signed)
HEMATOLOGY-ONCOLOGY TeleHEALTH VISIT PROGRESS NOTE  I connected with David Phillips on 08/17/20  at  2:30 PM EST by video enabled telemedicine visit and verified that I am speaking with the correct person using two identifiers. I discussed the limitations, risks, security and privacy concerns of performing an evaluation and management service by telemedicine and the availability of in-person appointments. The patient expressed understanding and agreed to proceed.   Other persons participating in the visit and their role in the encounter:  None  Patient's location: Home  Provider's location: office Chief Complaint: leukocytosis   INTERVAL HISTORY David Phillips is a 35 y.o. male who has above history reviewed by me today presents for follow up visit for management of leukocytosis.  Problems and complaints are listed below:  Reports fatigue. Feels anxious.  No new complaints.   Review of Systems  Constitutional: Positive for fatigue. Negative for appetite change, chills, fever and unexpected weight change.  HENT:   Negative for hearing loss and voice change.   Eyes: Negative for eye problems and icterus.  Respiratory: Negative for chest tightness, cough and shortness of breath.   Cardiovascular: Negative for chest pain and leg swelling.  Gastrointestinal: Negative for abdominal distention and abdominal pain.  Endocrine: Negative for hot flashes.  Genitourinary: Negative for difficulty urinating, dysuria and frequency.   Musculoskeletal: Negative for arthralgias.  Skin: Negative for itching and rash.  Neurological: Negative for light-headedness and numbness.  Hematological: Negative for adenopathy. Does not bruise/bleed easily.  Psychiatric/Behavioral: Negative for confusion.    Past Medical History:  Diagnosis Date  . Essential hypertension   . Gastric ulcer   . Hyperlipidemia   . Schizoaffective disorder ([place])    History reviewed. No pertinent surgical history.  Family History   Problem Relation Age of Onset  . Diabetes Mother   . Diabetes Father   . Heart attack Father 28       Bypass surgeries; 3 MI's  . Schizophrenia Other   . Heart attack Paternal Grandfather 57  . Other Neg Hx     Social History   Socioeconomic History  . Marital status: Single    Spouse name: Not on file  . Number of children: 0  . Years of education: Not on file  . Highest education level: Not on file  Occupational History  . Occupation: looking for a Lobbyist job  . Occupation: disability  Tobacco Use  . Smoking status: Current Every Day Smoker    Packs/day: 2.00    Years: 20.00    Pack years: 40.00    Types: Cigarettes    Start date: 76  . Smokeless tobacco: Former Systems developer  . Tobacco comment: currently at 1 pack daily--01/08/2020; has tried chantix, patches and gum without success  Vaping Use  . Vaping Use: Former  Substance and Sexual Activity  . Alcohol use: Yes    Alcohol/week: 6.0 standard drinks    Types: 6 Cans of beer per week    Comment: two 40 oz beers per night  . Drug use: Yes    Comment: h/o drug use in the past.  . Sexual activity: Not Currently  Other Topics Concern  . Not on file  Social History Narrative  . Not on file   Social Determinants of Health   Financial Resource Strain: Medium Risk  . Difficulty of Paying Living Expenses: Somewhat hard  Food Insecurity: No Food Insecurity  . Worried About Charity fundraiser in the Last Year: Never true  . Ran Out  of Food in the Last Year: Never true  Transportation Needs: Unmet Transportation Needs  . Lack of Transportation (Medical): Yes  . Lack of Transportation (Non-Medical): Yes  Physical Activity: Inactive  . Days of Exercise per Week: 0 days  . Minutes of Exercise per Session: 0 min  Stress: Stress Concern Present  . Feeling of Stress : Rather much  Social Connections: Unknown  . Frequency of Communication with Friends and Family: Not on file  . Frequency of Social Gatherings with Friends  and Family: Not on file  . Attends Religious Services: Not on file  . Active Member of Clubs or Organizations: Not on file  . Attends Archivist Meetings: Not on file  . Marital Status: Never married  Intimate Partner Violence: Not At Risk  . Fear of Current or Ex-Partner: No  . Emotionally Abused: No  . Physically Abused: No  . Sexually Abused: No    Current Outpatient Medications on File Prior to Visit  Medication Sig Dispense Refill  . albuterol (VENTOLIN HFA) 108 (90 Base) MCG/ACT inhaler Inhale 2 puffs into the lungs every 4 (four) hours as needed for wheezing or shortness of breath (coughing fits). 18 g 1  . atorvastatin (LIPITOR) 20 MG tablet TAKE 1 TABLET(20 MG) BY MOUTH DAILY 90 tablet 0  . hydrOXYzine (ATARAX/VISTARIL) 50 MG tablet Take 1 tablet (50 mg total) by mouth 3 (three) times daily as needed. (Patient taking differently: Take 50 mg by mouth every 6 (six) hours as needed.) 30 tablet 0  . losartan (COZAAR) 25 MG tablet TAKE 2 TABLETS BY MOUTH DAILY 180 tablet 0  . Lurasidone HCl 120 MG TABS Take 1 tablet by mouth daily.    . betamethasone valerate (VALISONE) 0.1 % cream APP AA ON HANDS D DURING DIFFICULT FLARES (Patient not taking: Reported on 08/17/2020)  3   No current facility-administered medications on file prior to visit.    Allergies  Allergen Reactions  . Prednisone Nausea And Vomiting       Observations/Objective: There were no vitals filed for this visit. There is no height or weight on file to calculate BMI.  Physical Exam Neurological:     Mental Status: He is alert.     CBC    Component Value Date/Time   WBC 10.9 (H) 07/31/2020 1535   RBC 4.74 07/31/2020 1535   HGB 16.3 07/31/2020 1535   HGB 16.7 02/23/2017 1853   HCT 45.2 07/31/2020 1535   HCT 48.8 02/23/2017 1853   PLT 289 07/31/2020 1535   PLT 316 02/23/2017 1853   MCV 95.4 07/31/2020 1535   MCV 97 02/23/2017 1853   MCV 96 04/13/2013 1513   MCH 34.4 (H) 07/31/2020 1535    MCHC 36.1 (H) 07/31/2020 1535   RDW 13.2 07/31/2020 1535   RDW 13.4 02/23/2017 1853   RDW 13.5 04/13/2013 1513   LYMPHSABS 2.6 07/31/2020 1535   MONOABS 1.1 (H) 07/31/2020 1535   EOSABS 1.0 (H) 07/31/2020 1535   BASOSABS 0.2 (H) 07/31/2020 1535    CMP     Component Value Date/Time   NA 140 07/23/2020 1452   NA 141 02/23/2017 1853   NA 135 (L) 04/13/2013 1513   K 4.1 07/23/2020 1452   K 3.7 04/13/2013 1513   CL 101 07/23/2020 1452   CL 101 04/13/2013 1513   CO2 27 07/23/2020 1452   CO2 26 04/13/2013 1513   GLUCOSE 114 (H) 07/23/2020 1452   GLUCOSE 93 04/13/2013 1513   BUN  13 07/23/2020 1452   BUN 8 02/23/2017 1853   BUN 10 04/13/2013 1513   CREATININE 0.99 07/23/2020 1452   CALCIUM 9.6 07/23/2020 1452   CALCIUM 9.1 04/13/2013 1513   PROT 6.9 07/23/2020 1452   PROT 7.1 02/23/2017 1853   PROT 7.4 04/13/2013 1513   ALBUMIN 4.5 02/23/2017 1853   ALBUMIN 4.2 04/13/2013 1513   AST 26 07/23/2020 1452   AST 29 04/13/2013 1513   ALT 34 07/23/2020 1452   ALT 43 04/13/2013 1513   ALKPHOS 83 02/23/2017 1853   ALKPHOS 94 04/13/2013 1513   BILITOT 0.4 07/23/2020 1452   BILITOT <0.2 02/23/2017 1853   BILITOT 0.5 04/13/2013 1513   GFRNONAA 98 07/23/2020 1452   GFRAA 114 07/23/2020 1452     Assessment and Plan: 1. Leukocytosis, unspecified type   2. Tobacco use   3. Family history of cancer     # Chronic leukocytosis.  Labs are reviewed and discussed with patient. Chronic monocytosis, neutrophilia and basophilia.  Check JAK2 V617F mutation , with reflex to other mutations CALR, MPL, JAK 2 Ex 12-15 mutations, and BCR-ABL FISh.  Consider bone marrow biopsy if no clear eitology is discovered and chronic monocytosis >21month.  Possible reactive feature due to smoking.   Family history of cancer, referred to genetic counselor.  Tobacco use, smoke cessation discussed.    Follow Up Instructions: TBD   I discussed the assessment and treatment plan with the patient. The  patient was provided an opportunity to ask questions and all were answered. The patient agreed with the plan and demonstrated an understanding of the instructions.  The patient was advised to call back or seek an in-person evaluation if the symptoms worsen or if the condition fails to improve as anticipated.    ZEarlie Server MD 08/17/2020 8:19 PM

## 2020-08-18 LAB — BCR-ABL1 FISH
Cells Analyzed: 200
Cells Counted: 200

## 2020-08-20 ENCOUNTER — Ambulatory Visit: Payer: Medicare Other

## 2020-08-25 ENCOUNTER — Inpatient Hospital Stay: Payer: Medicare Other | Attending: Oncology | Admitting: Licensed Clinical Social Worker

## 2020-08-25 ENCOUNTER — Other Ambulatory Visit: Payer: Self-pay

## 2020-08-25 ENCOUNTER — Inpatient Hospital Stay: Payer: Medicare Other

## 2020-08-25 ENCOUNTER — Inpatient Hospital Stay: Payer: Medicare Other | Admitting: Licensed Clinical Social Worker

## 2020-08-25 ENCOUNTER — Encounter: Payer: Self-pay | Admitting: Licensed Clinical Social Worker

## 2020-08-25 DIAGNOSIS — Z809 Family history of malignant neoplasm, unspecified: Secondary | ICD-10-CM

## 2020-08-25 DIAGNOSIS — Z800 Family history of malignant neoplasm of digestive organs: Secondary | ICD-10-CM | POA: Diagnosis not present

## 2020-08-25 NOTE — Progress Notes (Signed)
REFERRING PROVIDER: Earlie Server, MD [place],  [place] [address]  PRIMARY PROVIDER:  Delsa Grana, PA-C  PRIMARY REASON FOR VISIT:  1. Family history of stomach cancer   2. Family history of cancer    I connected with David Phillips on 08/25/2020 at 2:00 PM EDT by MyChart video conference and verified that I am speaking with the correct person using two identifiers.    Patient location: home Provider location: [place]:   David Phillips, a 35 y.o. male, was seen for a [facility] cancer genetics consultation at the request of Dr. Tasia Catchings due to a family history of cancer.  David Phillips presents to clinic today to discuss the possibility of a hereditary predisposition to cancer, genetic testing, and to further clarify his future cancer risks, as well as potential cancer risks for family members.    David Phillips is a 35 y.o. male with no personal history of cancer.    CANCER HISTORY:  Oncology History   No history exists.    Past Medical History:  Diagnosis Date   Essential hypertension    Family history of cancer    Family history of stomach cancer    Gastric ulcer    Hyperlipidemia    Schizoaffective disorder ([place])     No past surgical history on file.  Social History   Socioeconomic History   Marital status: Single    Spouse name: Not on file   Number of children: 0   Years of education: Not on file   Highest education level: Not on file  Occupational History   Occupation: looking for a Lobbyist job   Occupation: disability  Tobacco Use   Smoking status: Current Every Day Smoker    Packs/day: 2.00    Years: 20.00    Pack years: 40.00    Types: Cigarettes    Start date: 1998   Smokeless tobacco: Former Systems developer   Tobacco comment: currently at 1 pack daily--01/08/2020; has tried chantix, patches and gum without success  Vaping Use   Vaping Use: Former  Substance and Sexual Activity   Alcohol use: Yes     Alcohol/week: 6.0 standard drinks    Types: 6 Cans of beer per week    Comment: two 40 oz beers per night   Drug use: Yes    Comment: h/o drug use in the past.   Sexual activity: Not Currently  Other Topics Concern   Not on file  Social History Narrative   Not on file   Social Determinants of Health   Financial Resource Strain: Medium Risk   Difficulty of Paying Living Expenses: Somewhat hard  Food Insecurity: No Food Insecurity   Worried About Charity fundraiser in the Last Year: Never true   Arboriculturist in the Last Year: Never true  Transportation Needs: Unmet Transportation Needs   Lack of Transportation (Medical): Yes   Lack of Transportation (Non-Medical): Yes  Physical Activity: Inactive   Days of Exercise per Week: 0 days   Minutes of Exercise per Session: 0 min  Stress: Stress Concern Present   Feeling of Stress : Rather much  Social Connections: Unknown   Frequency of Communication with Friends and Family: Not on file   Frequency of Social Gatherings with Friends and Family: Not on file   Attends Religious Services: Not on file   Active Member of Clubs or Organizations: Not on file   Attends Archivist  Meetings: Not on file   Marital Status: Never married     FAMILY HISTORY:  We obtained a detailed, 4-generation family history.  Significant diagnoses are listed below: Family History  Problem Relation Age of Onset   Diabetes Mother    Diabetes Father    Heart attack Father 68       Bypass surgeries; 3 MI's   Schizophrenia Other    Heart attack Paternal Grandfather 20   Stomach cancer Maternal Grandfather    Other Neg Hx    David Phillips does not have children. He knows of one maternal half sibling but does not have much information about her.   David Phillips has very limited information about his family history as he was raised by his great-grandmother. He reports his mother says there is a strong family history of cancer  in the family including stomach, colon, brain, skin and bone cancers. His mother has a brain tumor, unsure if it is cancerous. Maternal grandfather did have stomach cancer and his mother had skin, bone, brain cancer, possibly metastatic. Patient does not know who his biological father is.  David Phillips is unaware of previous family history of genetic testing for hereditary cancer risks. Patient's maternal ancestors are of unknown descent, and paternal ancestors are of unknown descent. There is no reported Ashkenazi Jewish ancestry. There is no known consanguinity.    GENETIC COUNSELING ASSESSMENT: David Phillips is a 35 y.o. male with an unknown family history, but reportedly strong family history of cancer which is somewhat suggestive of a hereditary cancer syndrome and predisposition to cancer. We, therefore, discussed and recommended the following at today's visit.   DISCUSSION: We discussed that approximately 5-10% of cancer is hereditary. There are genes that increase the risk for colon and stomach cancer, such as the Lynch syndrome genes, but there are many other genes we can look at as well that inrease cancer risk.  We discussed that testing is beneficial for several reasons including  knowing about other cancer risks, identifying potential screening and risk-reduction options that may be appropriate, and to understand if other family members could be at risk for cancer and allow them to undergo genetic testing.   We reviewed the characteristics, features and inheritance patterns of hereditary cancer syndromes. We also discussed genetic testing, including the appropriate family members to test, the process of testing, insurance coverage and turn-around-time for results. We discussed the implications of a negative, positive and/or variant of uncertain significant result. We recommended David Phillips pursue genetic testing for the [facility] Multi-Cancer +RNA  gene panel.   The Multi-Cancer Panel offered  by [facility] includes sequencing and/or deletion duplication testing of the following 85 genes: AIP, ALK, APC, ATM, AXIN2,BAP1,  BARD1, BLM, BMPR1A, BRCA1, BRCA2, BRIP1, CASR, CDC73, CDH1, CDK4, CDKN1B, CDKN1C, CDKN2A (p14ARF), CDKN2A (p16INK4a), CEBPA, CHEK2, CTNNA1, DICER1, DIS3L2, EGFR (c.2369C>T, p.Thr790Met variant only), EPCAM (Deletion/duplication testing only), FH, FLCN, GATA2, GPC3, GREM1 (Promoter region deletion/duplication testing only), HOXB13 (c.251G>A, p.Gly84Glu), HRAS, KIT, MAX, MEN1, MET, MITF (c.952G>A, p.Glu318Lys variant only), MLH1, MSH2, MSH3, MSH6, MUTYH, NBN, NF1, NF2, NTHL1, PALB2, PDGFRA, PHOX2B, PMS2, POLD1, POLE, POT1, PRKAR1A, PTCH1, PTEN, RAD50, RAD51C, RAD51D, RB1, RECQL4, RET, RNF43, RUNX1, SDHAF2, SDHA (sequence changes only), SDHB, SDHC, SDHD, SMAD4, SMARCA4, SMARCB1, SMARCE1, STK11, SUFU, TERC, TERT, TMEM127, TP53, TSC1, TSC2, VHL, WRN and WT1.   Based on David Phillips's family history of cancer, he does not meets medical criteria for genetic testing. He may have an OOP cost.  PLAN: After considering the  risks, benefits, and limitations, David Phillips provided informed consent to pursue genetic testing and the blood sample was sent to [facility] for analysis of the Multi-Cancer Panel. Results should be available within approximately 2-3 weeks' time, at which point they will be disclosed by telephone to David Phillips, as will any additional recommendations warranted by these results. David Phillips will receive a summary of his genetic counseling visit and a copy of his results once available. This information will also be available in Epic.   David Phillips questions were answered to his satisfaction today. Our contact information was provided should additional questions or concerns arise. Thank you for the referral and allowing [place] to share in the care of your patient.   Faith Rogue, MS, [facility] Genetic Counselor [email or web address] Phone:  [phone]  The patient was seen for a total of 25 minutes in face-to-face genetic counseling.  Dr. Grayland Ormond was available for discussion regarding this case.   _______________________________________________________________________ For Office Staff:  Number of people involved in session: 1 Was an Intern/ student involved with case: no

## 2020-08-28 ENCOUNTER — Encounter: Payer: Self-pay | Admitting: Family Medicine

## 2020-08-31 ENCOUNTER — Ambulatory Visit: Payer: Medicare Other

## 2020-08-31 NOTE — Telephone Encounter (Signed)
Would need a specialist (psychiatry) to do this

## 2020-08-31 NOTE — Telephone Encounter (Signed)
Oh sorry - sent to the wrong person

## 2020-09-03 ENCOUNTER — Encounter: Payer: Self-pay | Admitting: Family Medicine

## 2020-09-03 LAB — JAK2 W/REFLEX TO CALR/MPL

## 2020-09-03 LAB — CALR + MPL (REFLEXED)

## 2020-09-07 ENCOUNTER — Encounter: Payer: Self-pay | Admitting: Family Medicine

## 2020-09-07 ENCOUNTER — Encounter: Payer: Self-pay | Admitting: Oncology

## 2020-09-08 ENCOUNTER — Other Ambulatory Visit: Payer: Self-pay

## 2020-09-08 ENCOUNTER — Telehealth: Payer: Self-pay

## 2020-09-08 DIAGNOSIS — D72829 Elevated white blood cell count, unspecified: Secondary | ICD-10-CM

## 2020-09-08 DIAGNOSIS — D72821 Monocytosis (symptomatic): Secondary | ICD-10-CM

## 2020-09-08 DIAGNOSIS — E782 Mixed hyperlipidemia: Secondary | ICD-10-CM

## 2020-09-08 MED ORDER — ATORVASTATIN CALCIUM 20 MG PO TABS: ORAL_TABLET | ORAL | 0 refills | Status: DC

## 2020-09-08 NOTE — Telephone Encounter (Signed)
Patient sent a MyChart message requesting to have a bone marrow bx.  MD agrees for him to be scheduled for bone arrow bx with reason: persistent monocytosis.  See MD 2 weeks after BM bx.

## 2020-09-08 NOTE — Telephone Encounter (Signed)
Scheduled for BMB on  09/16/20 to arrive at 8:30 for 9:00. The IR nurse will call him prior to the procedure to go over the instructions.    Please schedule him for MD follow up 2 weeks after Bone marrow biopsy and inform patient of follow up appt.

## 2020-09-08 NOTE — Telephone Encounter (Signed)
Done...  Pt was made aware of his sched  BMB on  09/16/20 to arrive at 8:30.. And will RTC on 09/30/20 for  -MD follow up 2 weeks after Bone marrow biopsy appt @ 10:30

## 2020-09-08 NOTE — Telephone Encounter (Signed)
Order entered and request faxed to specialty scheduling.

## 2020-09-09 ENCOUNTER — Encounter: Payer: Self-pay | Admitting: Family Medicine

## 2020-09-09 NOTE — Telephone Encounter (Signed)
Is this a for either of you would consider filling out? He is seeing me on the 6th but looks like he needs an MD to sign stating he has mental illness.

## 2020-09-14 ENCOUNTER — Other Ambulatory Visit: Payer: Self-pay | Admitting: Radiology

## 2020-09-15 ENCOUNTER — Other Ambulatory Visit: Payer: Self-pay | Admitting: Student

## 2020-09-15 ENCOUNTER — Other Ambulatory Visit: Payer: Self-pay | Admitting: Radiology

## 2020-09-16 ENCOUNTER — Ambulatory Visit: Admission: RE | Admit: 2020-09-16 | Payer: Medicare Other | Source: Ambulatory Visit

## 2020-09-16 ENCOUNTER — Inpatient Hospital Stay: Payer: Medicare Other

## 2020-09-23 ENCOUNTER — Ambulatory Visit (INDEPENDENT_AMBULATORY_CARE_PROVIDER_SITE_OTHER): Payer: Medicare Other | Admitting: Physician Assistant

## 2020-09-23 ENCOUNTER — Other Ambulatory Visit: Payer: Self-pay

## 2020-09-23 ENCOUNTER — Encounter: Payer: Self-pay | Admitting: Family Medicine

## 2020-09-23 ENCOUNTER — Encounter: Payer: Self-pay | Admitting: Physician Assistant

## 2020-09-23 VITALS — BP 142/86 | HR 96 | Temp 97.7°F | Resp 16 | Ht 69.0 in | Wt 174.9 lb

## 2020-09-23 DIAGNOSIS — G8929 Other chronic pain: Secondary | ICD-10-CM

## 2020-09-23 DIAGNOSIS — F250 Schizoaffective disorder, bipolar type: Secondary | ICD-10-CM

## 2020-09-23 DIAGNOSIS — M25561 Pain in right knee: Secondary | ICD-10-CM

## 2020-09-23 DIAGNOSIS — M25562 Pain in left knee: Secondary | ICD-10-CM | POA: Diagnosis not present

## 2020-09-23 NOTE — Progress Notes (Signed)
    Established patient visit   Patient: David Phillips   DOB: 04/19/1986   34 y.o. Male  MRN: [id number] Visit Date: 09/23/2020  Today's healthcare provider:  M , PA-C   Chief Complaint  Patient presents with  . Knee Pain    Bilateral   Subjective    HPI HPI    Knee Pain    Comments: Bilateral       Last edited by Graves, Jamie, CMA on 09/23/2020  2:21 PM. (History)      Patient presents today for bilateral knee pain ongoing for six years. He reports he has arthritis for six years. Reports he was told this by his previous PCP. Reports he has tried multiple over the counter interventions including tylenol, ibuprofen. Reports these have not worked. He has been seen by pain management provider Dr. Lateef 09/2018. Patient reports he was advised to go forward with procedures and he did not want to do this. Did not follow up but did ultimately received xrays of his bilateral knees, hips, and shoulders, all of which were unremarkable.  Reports he think he needs an MRI and maybe his knees need replacing.   Impression of 09/2018 Pain management visit with Dr. Lateef below:    Pending imaging results, patient could be a candidate for intra-articular shoulder steroid injections, suprascapular nerve block, lumbar facet medial branch nerve blocks, lumbar epidural steroid injection, intra-articular hip injection, bilateral genicular nerve block, bilateral intra-articular knee injection, bilateral intra-articular knee steroid injection.  Patient denies having received any injections in the past.  Furthermore I was very clear with the patient regarding medication management.  Given the patient's previous substance abuse history and criminal history along with the psych history of schizoaffective disorder currently on Latuda, patient is high risk for substance abuse/misuse and opioid therapy will not be a part of the patient's treatment plan.  Patient endorsed understanding.  Patient was  requesting medication to help with his pain and we discussed non-opioid analgesics.  Trial of diclofenac 75 mg twice daily as below.  Patient instructed to refrain from all other NSAIDs while on this medication.  Patient endorsed understanding.  Patient is also undergoing a bone marrow biopsy for recent leukocytosis. He reports his previous provider at [facility] was giving him hydrocodone and could he get some of that until he figures out what's going on with his bones.      Medications: Outpatient Medications Prior to Visit  Medication Sig  . albuterol (VENTOLIN HFA) 108 (90 Base) MCG/ACT inhaler Inhale 2 puffs into the lungs every 4 (four) hours as needed for wheezing or shortness of breath (coughing fits).  . atorvastatin (LIPITOR) 20 MG tablet TAKE 1 TABLET(20 MG) BY MOUTH DAILY  . betamethasone valerate (VALISONE) 0.1 % cream   . hydrOXYzine (ATARAX/VISTARIL) 50 MG tablet Take 1 tablet (50 mg total) by mouth 3 (three) times daily as needed. (Patient taking differently: Take 50 mg by mouth every 6 (six) hours as needed.)  . losartan (COZAAR) 25 MG tablet TAKE 2 TABLETS BY MOUTH DAILY  . Lurasidone HCl 120 MG TABS Take 1 tablet by mouth daily.   No facility-administered medications prior to visit.    Review of Systems     Objective    BP (!) 142/86   Pulse 96   Temp 97.7 F (36.5 C)   Resp 16   Ht 5' 9" (1.753 m)   Wt 174 lb 14.4 oz (79.3 kg)   SpO2 96%   BMI   25.83 kg/m     Physical Exam Constitutional:      Appearance: Normal appearance.  Cardiovascular:     Rate and Rhythm: Normal rate.  Pulmonary:     Effort: Pulmonary effort is normal.  Musculoskeletal:     Right knee: Normal.     Left knee: Normal.  Skin:    General: Skin is warm and dry.  Neurological:     Mental Status: He is alert and oriented to person, place, and time. Mental status is at baseline.       No results found for any visits on 09/23/20.  Assessment & Plan    1. Chronic pain of both  knees  Have counseled the patient that he does not have arthritis and have showed him the normal plain films of his knees. He is not accepting of this statement and says his previous PCP already told him he has arthritis. Counseled patient on alternative causes of knee pain. He asks for hydrocodone until we figure out what's going on with his knees,  as his previous provider at [facility] was giving him this medication. I declined and recommended alternative medications including voltaren gel. I offered to send him in voltaren gel and he states "That would be pointless, just like this visit."  - Ambulatory referral to Orthopedics  2. Schizoaffective disorder, bipolar type ([facility])  He asks again about his loan repayment and disability form. I have responded to this inquiry two weeks ago. I again informed him that he will need to have his NP fill out the form and the Nps supervising physician can cosign or our MD here has graciously offered to cosign. He says OK.    No follow-ups on file.          M , PA-C  [facility] [phone] (phone) [phone] (fax)  [facility]  

## 2020-09-28 ENCOUNTER — Encounter: Payer: Self-pay | Admitting: Oncology

## 2020-09-29 NOTE — Telephone Encounter (Signed)
Done  Pts 09/30/20 appt to RTC for   MD follow up 2 weeks after Bone marrow biopsy was cx as requested Due to pt cx his Bx A detailed message was left on pts VM making him aware that his 09/30/20 appt had been  Cx and to contact the office so that we can try to get both Bx and MD F/U appts R/S

## 2020-09-29 NOTE — Telephone Encounter (Signed)
Called patient and left a message letting him know that the appt for tomorrow was to discuss bone marrow bx results.  Since he was not able to go to bmb will cx f/u appt at this time.  We are still waiting to hear back from him about getting the bmb r/s (documented in 09/17/20 MyChart message).  Please cancel 2 week f/u for tomorrow and try to contact him as well.

## 2020-09-30 ENCOUNTER — Inpatient Hospital Stay: Payer: Medicare Other | Admitting: Oncology

## 2020-10-01 ENCOUNTER — Encounter: Payer: Self-pay | Admitting: Family Medicine

## 2020-10-01 DIAGNOSIS — F250 Schizoaffective disorder, bipolar type: Secondary | ICD-10-CM

## 2020-10-20 ENCOUNTER — Encounter: Payer: Self-pay | Admitting: Licensed Clinical Social Worker

## 2020-11-24 ENCOUNTER — Telehealth: Payer: Self-pay | Admitting: Family Medicine

## 2020-11-24 NOTE — Telephone Encounter (Signed)
   Telephone encounter was:  Successful.  11/24/2020 Name: Demontae Antunes MRN: [id number] DOB: 08/23/1985  Piotr Christopher is a 35 y.o. year old male who is a primary care patient of [name], [place] . The community resource team was consulted for assistance with Financial Difficulties related to paying rent and utilities  Care guide performed the following interventions: Patient called concerned because he cannot pay his rent and his food stamps have been cut off and transportation information. Called his doctors office and left a message for NP referral corrdinator Laqueta Due at [phone] because I need a referral placed in the system to be able to work on these issues that he is needing help with. Waiting on return call. .  Follow Up Plan:  waiting for office to call me back to place a referral for patients need for community resources.   Rojelio Brenner Care Guide, Embedded Care Coordination [facility], Care Management Phone: [phone] Email: [email or web address]

## 2020-11-26 ENCOUNTER — Encounter: Payer: Self-pay | Admitting: Family Medicine

## 2020-11-26 DIAGNOSIS — Z139 Encounter for screening, unspecified: Secondary | ICD-10-CM

## 2020-12-02 ENCOUNTER — Other Ambulatory Visit: Payer: Self-pay

## 2020-12-02 DIAGNOSIS — I10 Essential (primary) hypertension: Secondary | ICD-10-CM

## 2020-12-02 DIAGNOSIS — E782 Mixed hyperlipidemia: Secondary | ICD-10-CM

## 2020-12-02 MED ORDER — LOSARTAN POTASSIUM 25 MG PO TABS: ORAL_TABLET | ORAL | 0 refills | Status: DC

## 2020-12-02 MED ORDER — ATORVASTATIN CALCIUM 20 MG PO TABS: ORAL_TABLET | ORAL | 0 refills | Status: DC

## 2020-12-04 ENCOUNTER — Telehealth: Payer: Self-pay | Admitting: Family Medicine

## 2020-12-04 NOTE — Telephone Encounter (Signed)
   Telephone encounter was:  Successful.  12/04/2020 Name: David Phillips MRN: [id number] DOB: July 19, 1985  David Phillips is a 35 y.o. year old male who is a primary care patient of [name], [place] . The community resource team was consulted for assistance with Financial Difficulties related to paying for rent to [facility] guide performed the following interventions: Discussed resources to assist with helping pay for rent through the [facility]. Explained to him that I am waiting on a response from the organization because the person is on vacation. I let him know that I will be in touch within the next week .  Follow Up Plan:  Care guide will follow up with patient by phone over the next week  David Phillips Care Guide, Embedded Care Coordination [facility], Care Management Phone: [phone] Email: [email or web address]

## 2020-12-07 ENCOUNTER — Telehealth: Payer: Self-pay | Admitting: Family Medicine

## 2020-12-07 NOTE — Telephone Encounter (Signed)
   Telephone encounter was:  Successful.  12/07/2020 Name: David Phillips MRN: [id number] DOB: April 02, 1986  David Phillips is a 35 y.o. year old male who is a primary care patient of [name], [place] . The community resource team was consulted for assistance with Financial Difficulties related to rental assistance  Care guide performed the following interventions: Follow up call placed to the patient to discuss status of referral.  Follow Up Plan:  Client will send me a copy of his lease with [employer] authority so that I can place a request to [facility] for assistance with helping pay his rent.   Rojelio Brenner Care Guide, Embedded Care Coordination [facility], Care Management Phone: [phone] Email: [email or web address]

## 2020-12-09 ENCOUNTER — Other Ambulatory Visit: Payer: Self-pay

## 2020-12-09 ENCOUNTER — Telehealth: Payer: Self-pay | Admitting: Psychiatry

## 2020-12-09 ENCOUNTER — Encounter: Payer: Medicare Other | Admitting: Psychiatry

## 2020-12-09 ENCOUNTER — Telehealth: Payer: Self-pay | Admitting: Family Medicine

## 2020-12-09 ENCOUNTER — Encounter: Payer: Self-pay | Admitting: Family Medicine

## 2020-12-09 NOTE — Telephone Encounter (Signed)
Although he tried to log in for video visit, he was unable to enable the camera. After having attempted more than 20 mins, he agrees that the appointment to be rescheduled. He agrees to contact his PCP if he needs medication refills. He also agrees that the paperwork for student loan will not be completed by this clinician after the next visit/intake. He states that it will be signed by his PCP once this appointment is done. He denies SI.   Reschedule to 7/6 at 2 PM. He will use his mother's phone for virtual visit- [phone]

## 2020-12-09 NOTE — Progress Notes (Deleted)
Psychiatric Initial Adult Assessment   Patient Identification: David Phillips MRN:  [id number] Date of Evaluation:  12/09/2020 Referral Source: *** Chief Complaint:   Visit Diagnosis: No diagnosis found.  History of Present Illness:   David Phillips is a 35 y.o. year old male with a history of schizoaffective, hypertension, hyperlipidemia, who is referred for           Associated Signs/Symptoms: Depression Symptoms:  {DEPRESSION SYMPTOMS:20000} (Hypo) Manic Symptoms:  {BHH MANIC SYMPTOMS:22872} Anxiety Symptoms:  {BHH ANXIETY SYMPTOMS:22873} Psychotic Symptoms:  {BHH PSYCHOTIC SYMPTOMS:22874} PTSD Symptoms: {BHH PTSD SYMPTOMS:22875}  Past Psychiatric History:  Outpatient:  Psychiatry admission:  Previous suicide attempt:  Past trials of medication:  History of violence:    Previous Psychotropic Medications: {YES/NO:21197}  Substance Abuse History in the last 12 months:  {yes no:314532}  Consequences of Substance Abuse: {BHH CONSEQUENCES OF SUBSTANCE ABUSE:22880}  Past Medical History:  Past Medical History:  Diagnosis Date  . Essential hypertension   . Family history of cancer   . Family history of stomach cancer   . Gastric ulcer   . Hyperlipidemia   . Schizoaffective disorder (HCC)    No past surgical history on file.  Family Psychiatric History: ***  Family History:  Family History  Problem Relation Age of Onset  . Diabetes Mother   . Diabetes Father   . Heart attack Father 62       Bypass surgeries; 3 MI's  . Schizophrenia Other   . Heart attack Paternal Grandfather 33  . Stomach cancer Maternal Grandfather   . Other Neg Hx     Social History:   Social History   Socioeconomic History  . Marital status: Single    Spouse name: Not on file  . Number of children: 0  . Years of education: Not on file  . Highest education level: Not on file  Occupational History  . Occupation: looking for a Administrator, sports job  . Occupation: disability  Tobacco Use   . Smoking status: Every Day    Packs/day: 2.00    Years: 20.00    Pack years: 40.00    Types: Cigarettes    Start date: 22  . Smokeless tobacco: Former  . Tobacco comments:    currently at 1 pack daily--01/08/2020; has tried chantix, patches and gum without success  Vaping Use  . Vaping Use: Former  Substance and Sexual Activity  . Alcohol use: Yes    Alcohol/week: 6.0 standard drinks    Types: 6 Cans of beer per week    Comment: two 40 oz beers per night  . Drug use: Yes    Comment: h/o drug use in the past.  . Sexual activity: Not Currently  Other Topics Concern  . Not on file  Social History Narrative  . Not on file   Social Determinants of Health   Financial Resource Strain: Medium Risk  . Difficulty of Paying Living Expenses: Somewhat hard  Food Insecurity: No Food Insecurity  . Worried About Programme researcher, broadcasting/film/video in the Last Year: Never true  . Ran Out of Food in the Last Year: Never true  Transportation Needs: Unmet Transportation Needs  . Lack of Transportation (Medical): Yes  . Lack of Transportation (Non-Medical): Yes  Physical Activity: Inactive  . Days of Exercise per Week: 0 days  . Minutes of Exercise per Session: 0 min  Stress: Stress Concern Present  . Feeling of Stress : Rather much  Social Connections: Unknown  . Frequency of Communication with Friends  and Family: Not on file  . Frequency of Social Gatherings with Friends and Family: Not on file  . Attends Religious Services: Not on file  . Active Member of Clubs or Organizations: Not on file  . Attends Banker Meetings: Not on file  . Marital Status: Never married    Additional Social History: ***  Allergies:   Allergies  Allergen Reactions  . Prednisone Nausea And Vomiting    Metabolic Disorder Labs: Lab Results  Component Value Date   HGBA1C 5.1 07/23/2020   MPG 100 07/23/2020   No results found for: PROLACTIN Lab Results  Component Value Date   CHOL 154 07/23/2020    TRIG 319 (H) 07/23/2020   HDL 52 07/23/2020   CHOLHDL 3.0 07/23/2020   LDLCALC 64 07/23/2020   LDLCALC 90 05/09/2019   Lab Results  Component Value Date   TSH 1.470 02/23/2017    Therapeutic Level Labs: No results found for: LITHIUM No results found for: CBMZ No results found for: VALPROATE  Current Medications: Current Outpatient Medications  Medication Sig Dispense Refill  . albuterol (VENTOLIN HFA) 108 (90 Base) MCG/ACT inhaler Inhale 2 puffs into the lungs every 4 (four) hours as needed for wheezing or shortness of breath (coughing fits). 18 g 1  . atorvastatin (LIPITOR) 20 MG tablet TAKE 1 TABLET(20 MG) BY MOUTH DAILY 90 tablet 0  . betamethasone valerate (VALISONE) 0.1 % cream   3  . hydrOXYzine (ATARAX/VISTARIL) 50 MG tablet Take 1 tablet (50 mg total) by mouth 3 (three) times daily as needed. (Patient taking differently: Take 50 mg by mouth every 6 (six) hours as needed.) 30 tablet 0  . losartan (COZAAR) 25 MG tablet TAKE 2 TABLETS BY MOUTH DAILY 180 tablet 0  . Lurasidone HCl 120 MG TABS Take 1 tablet by mouth daily.     No current facility-administered medications for this visit.    Musculoskeletal: Strength & Muscle Tone:  N/A Gait & Station:  N/A Patient leans: N/A  Psychiatric Specialty Exam: Review of Systems  There were no vitals taken for this visit.There is no height or weight on file to calculate BMI.  General Appearance: {Appearance:22683}  Eye Contact:  {BHH EYE CONTACT:22684}  Speech:  Clear and Coherent  Volume:  Normal  Mood:  {BHH MOOD:22306}  Affect:  {Affect (PAA):22687}  Thought Process:  Coherent  Orientation:  Full (Time, Place, and Person)  Thought Content:  Logical  Suicidal Thoughts:  {ST/HT (PAA):22692}  Homicidal Thoughts:  {ST/HT (PAA):22692}  Memory:  Immediate;   Good  Judgement:  {Judgement (PAA):22694}  Insight:  {Insight (PAA):22695}  Psychomotor Activity:  Normal  Concentration:  Concentration: Good and Attention Span: Good   Recall:  Good  Fund of Knowledge:Good  Language: Good  Akathisia:  No  Handed:  Right  AIMS (if indicated):  not done  Assets:  Communication Skills Desire for Improvement  ADL's:  Intact  Cognition: WNL  Sleep:  {BHH GOOD/FAIR/POOR:22877}   Screenings: AUDIT    Loss adjuster, chartered Office Visit from 08/09/2019 in [facility] Office Visit from 07/08/2019 in [facility] Office Visit from 11/27/2018 in [facility] Office Visit from 08/13/2018 in [facility]  Alcohol Use Disorder Identification Test Final Score (AUDIT) 2 3 7 4       PHQ2-9    Flowsheet Row Office Visit from 09/23/2020 in [facility] Office Visit from 07/23/2020 in [facility] Office Visit from 04/27/2020 in  [facility] Video Visit from 03/30/2020 in [facility] Clinical Support from 03/24/2020 in [facility]  PHQ-2 Total Score 0 0 1 0 1  PHQ-9 Total Score 0 -- 1 0 3       Assessment and Plan:  Assessment  Plan   The patient demonstrates the following risk factors for suicide: Chronic risk factors for suicide include: {Chronic Risk Factors for EZMOQHU:76546503}. Acute risk factors for suicide include: {Acute Risk Factors for TWSFKCL:27517001}. Protective factors for this patient include: {Protective Factors for Suicide VCBS:49675916}. Considering these factors, the overall suicide risk at this point appears to be {Desc; low/moderate/high:110033}. Patient {ACTION; IS/IS BWG:66599357} appropriate for outpatient follow up.    Neysa Hotter, MD 6/22/20221:10 PM  This encounter was created in error - please disregard.

## 2020-12-09 NOTE — Telephone Encounter (Signed)
   Telephone encounter was:  Successful.  12/09/2020 Name: David Phillips MRN: [id number] DOB: 1985/10/04  David Phillips is a 35 y.o. year old male who is a primary care patient of [name], [place] . The community resource team was consulted for assistance with Financial Difficulties related to paying for rent.   Care guide performed the following interventions:  sent patient an email as to what documents are needed before I can send in a request to [facility] to help pay for his rent. I will need a copy of pts front page of lease, the page of the lease that talks about his financial responsibility and the letter that indicates his rent increase from [employer] .  Follow Up Plan:  Care guide will follow up with patient by phone over the next week  Rojelio Brenner Care Guide, Embedded Care Coordination [facility], Care Management Phone: [phone] Email: [email or web address]

## 2020-12-11 ENCOUNTER — Telehealth: Payer: Self-pay | Admitting: Family Medicine

## 2020-12-11 NOTE — Telephone Encounter (Signed)
   Telephone encounter was:  Successful.  12/11/2020 Name: Zalman Hull MRN: [id number] DOB: 1986/05/22  Xerxes Agrusa is a 35 y.o. year old male who is a primary care patient of [name], [place] . The community resource team was consulted for assistance with  paying rent  Care guide performed the following interventions: Follow up call placed to community resources to determine status of patients referral Follow up call placed to the patient to discuss status of referral Sent pt an email that I received the documents for his rental agreement. Placed a referral to the [facility] for rental assistance .  Follow Up Plan:  Care guide will follow up with patient by phone over the next week and Care guide will outreach resources to assist patient with week  Rojelio Brenner Care Guide, Embedded Care Coordination [facility], Care Management Phone: [phone] Email: [email or web address]

## 2020-12-13 NOTE — Progress Notes (Signed)
This encounter was created in error - please disregard.

## 2020-12-14 ENCOUNTER — Telehealth: Payer: Self-pay | Admitting: Family Medicine

## 2020-12-14 NOTE — Telephone Encounter (Signed)
   Telephone encounter was:  Successful.  12/14/2020 Name: David Phillips MRN: [id number] DOB: April 04, 1986  David Phillips is a 35 y.o. year old male who is a primary care patient of [name], [place] . The community resource team was consulted for assistance with Financial Difficulties related to paying rent  Care guide performed the following interventions: Follow up call placed to community resources to determine status of patients referral Follow up call placed to the patient to discuss status of referral.  Follow Up Plan:  Care guide will follow up with patient by phone over the next week and Care guide will outreach resources to assist patient with week  Rojelio Brenner Care Guide, Embedded Care Coordination [facility], Care Management Phone: [phone] Email: [email or web address]

## 2020-12-16 ENCOUNTER — Telehealth: Payer: Self-pay | Admitting: Family Medicine

## 2020-12-16 NOTE — Telephone Encounter (Signed)
   Telephone encounter was:  Successful.  12/16/2020 Name: Exavier Lina MRN: [id number] DOB: 10/28/1985  Shiva Sahagian is a 35 y.o. year old male who is a primary care patient of [name], [place] . The community resource team was consulted for assistance with Financial Difficulties related to paying rent  Care guide performed the following interventions: Follow up call placed to community resources to determine status of patients referral Follow up call placed to the patient to discuss status of referral [facility] approved 3 months of rental payment for pt, will issue check and place in the mail today .  Follow Up Plan:  No further follow up planned at this time. The patient has been provided with needed resources.  Rojelio Brenner Care Guide, Embedded Care Coordination [facility], Care Management Phone: [phone] Email: [email or web address]

## 2020-12-23 ENCOUNTER — Telehealth: Payer: Medicare Other | Admitting: Psychiatry

## 2020-12-24 ENCOUNTER — Other Ambulatory Visit: Payer: Self-pay | Admitting: Family Medicine

## 2020-12-24 DIAGNOSIS — E782 Mixed hyperlipidemia: Secondary | ICD-10-CM

## 2020-12-24 DIAGNOSIS — I10 Essential (primary) hypertension: Secondary | ICD-10-CM

## 2020-12-30 ENCOUNTER — Ambulatory Visit
Admission: RE | Admit: 2020-12-30 | Discharge: 2020-12-30 | Disposition: A | Discharge status: Home / Self Care | Payer: Medicare Other | Source: Ambulatory Visit | Attending: Oncology | Admitting: Oncology

## 2020-12-30 ENCOUNTER — Other Ambulatory Visit: Payer: Self-pay

## 2020-12-30 DIAGNOSIS — R109 Unspecified abdominal pain: Secondary | ICD-10-CM | POA: Diagnosis present

## 2021-01-07 NOTE — Progress Notes (Deleted)
Psychiatric Initial Adult Assessment   Patient Identification: David Phillips MRN:  [id number] Date of Evaluation:  01/07/2021 Referral Source: *** Chief Complaint:   Visit Diagnosis: No diagnosis found.  History of Present Illness:   David Phillips is a 35 y.o. year old male with a history of schizoaffective disorder, hypertension, hyperlipidemia, who is referred for schizoaffective disorder.   Daily routine: Exercise: Employment:  Support: Household:  Marital status: Number of children:      Associated Signs/Symptoms: Depression Symptoms:  {DEPRESSION SYMPTOMS:20000} (Hypo) Manic Symptoms:  {BHH MANIC SYMPTOMS:22872} Anxiety Symptoms:  {BHH ANXIETY SYMPTOMS:22873} Psychotic Symptoms:  {BHH PSYCHOTIC SYMPTOMS:22874} PTSD Symptoms: {BHH PTSD SYMPTOMS:22875}  Past Psychiatric History:  Outpatient:  Psychiatry admission:  Previous suicide attempt:  Past trials of medication:  History of violence:    Previous Psychotropic Medications: {YES/NO:21197}  Substance Abuse History in the last 12 months:  {yes no:314532}  Consequences of Substance Abuse: {BHH CONSEQUENCES OF SUBSTANCE ABUSE:22880}  Past Medical History:  Past Medical History:  Diagnosis Date   Essential hypertension    Family history of cancer    Family history of stomach cancer    Gastric ulcer    Hyperlipidemia    Schizoaffective disorder (HCC)    No past surgical history on file.  Family Psychiatric History: ***  Family History:  Family History  Problem Relation Age of Onset   Diabetes Mother    Diabetes Father    Heart attack Father 40       Bypass surgeries; 3 MI's   Schizophrenia Other    Heart attack Paternal Grandfather 3   Stomach cancer Maternal Grandfather    Other Neg Hx     Social History:   Social History   Socioeconomic History   Marital status: Single    Spouse name: Not on file   Number of children: 0   Years of education: Not on file   Highest education level: Not on  file  Occupational History   Occupation: looking for a Administrator, sports job   Occupation: disability  Tobacco Use   Smoking status: Every Day    Packs/day: 2.00    Years: 20.00    Pack years: 40.00    Types: Cigarettes    Start date: 1998   Smokeless tobacco: Former   Tobacco comments:    currently at 1 pack daily--01/08/2020; has tried chantix, patches and gum without success  Vaping Use   Vaping Use: Former  Substance and Sexual Activity   Alcohol use: Yes    Alcohol/week: 6.0 standard drinks    Types: 6 Cans of beer per week    Comment: two 40 oz beers per night   Drug use: Yes    Comment: h/o drug use in the past.   Sexual activity: Not Currently  Other Topics Concern   Not on file  Social History Narrative   Not on file   Social Determinants of Health   Financial Resource Strain: Medium Risk   Difficulty of Paying Living Expenses: Somewhat hard  Food Insecurity: No Food Insecurity   Worried About Programme researcher, broadcasting/film/video in the Last Year: Never true   Ran Out of Food in the Last Year: Never true  Transportation Needs: Unmet Transportation Needs   Lack of Transportation (Medical): Yes   Lack of Transportation (Non-Medical): Yes  Physical Activity: Inactive   Days of Exercise per Week: 0 days   Minutes of Exercise per Session: 0 min  Stress: Stress Concern Present   Feeling of Stress :  Rather much  Social Connections: Unknown   Frequency of Communication with Friends and Family: Not on file   Frequency of Social Gatherings with Friends and Family: Not on file   Attends Religious Services: Not on file   Active Member of Clubs or Organizations: Not on file   Attends Banker Meetings: Not on file   Marital Status: Never married    Additional Social History: ***  Allergies:   Allergies  Allergen Reactions   Prednisone Nausea And Vomiting    Metabolic Disorder Labs: Lab Results  Component Value Date   HGBA1C 5.1 07/23/2020   MPG 100 07/23/2020   No  results found for: PROLACTIN Lab Results  Component Value Date   CHOL 154 07/23/2020   TRIG 319 (H) 07/23/2020   HDL 52 07/23/2020   CHOLHDL 3.0 07/23/2020   LDLCALC 64 07/23/2020   LDLCALC 90 05/09/2019   Lab Results  Component Value Date   TSH 1.470 02/23/2017    Therapeutic Level Labs: No results found for: LITHIUM No results found for: CBMZ No results found for: VALPROATE  Current Medications: Current Outpatient Medications  Medication Sig Dispense Refill   albuterol (VENTOLIN HFA) 108 (90 Base) MCG/ACT inhaler Inhale 2 puffs into the lungs every 4 (four) hours as needed for wheezing or shortness of breath (coughing fits). 18 g 1   atorvastatin (LIPITOR) 20 MG tablet TAKE 1 TABLET(20 MG) BY MOUTH DAILY 90 tablet 0   betamethasone valerate (VALISONE) 0.1 % cream   3   hydrOXYzine (ATARAX/VISTARIL) 50 MG tablet Take 1 tablet (50 mg total) by mouth 3 (three) times daily as needed. (Patient taking differently: Take 50 mg by mouth every 6 (six) hours as needed.) 30 tablet 0   losartan (COZAAR) 25 MG tablet TAKE 2 TABLETS BY MOUTH DAILY 180 tablet 0   Lurasidone HCl 120 MG TABS Take 1 tablet by mouth daily.     No current facility-administered medications for this visit.    Musculoskeletal: Strength & Muscle Tone:  N/A Gait & Station:  N/A Patient leans: N/A  Psychiatric Specialty Exam: Review of Systems  There were no vitals taken for this visit.There is no height or weight on file to calculate BMI.  General Appearance: {Appearance:22683}  Eye Contact:  {BHH EYE CONTACT:22684}  Speech:  Clear and Coherent  Volume:  Normal  Mood:  {BHH MOOD:22306}  Affect:  {Affect (PAA):22687}  Thought Process:  Coherent  Orientation:  Full (Time, Place, and Person)  Thought Content:  Logical  Suicidal Thoughts:  {ST/HT (PAA):22692}  Homicidal Thoughts:  {ST/HT (PAA):22692}  Memory:  Immediate;   Good  Judgement:  {Judgement (PAA):22694}  Insight:  {Insight (PAA):22695}   Psychomotor Activity:  Normal  Concentration:  Concentration: Good and Attention Span: Good  Recall:  Good  Fund of Knowledge:Good  Language: Good  Akathisia:  No  Handed:  Right  AIMS (if indicated):  not done  Assets:  Communication Skills Desire for Improvement  ADL's:  Intact  Cognition: WNL  Sleep:  {BHH GOOD/FAIR/POOR:22877}   Screenings: AUDIT    Loss adjuster, chartered Office Visit [facility] 08/09/2019 in [facility] Office Visit from 07/08/2019 in [facility] Office Visit from 11/27/2018 in [employer] Office Visit from 08/13/2018 in [facility]  Alcohol Use Disorder Identification Test Final Score (AUDIT) 2 3 7 4       PHQ2-9    Flowsheet Row Office Visit from 09/23/2020 in [facility] Office  Visit from 07/23/2020 in [facility] Office Visit from 04/27/2020 in [facility] Video Visit from 03/30/2020 in [facility] Clinical Support from 03/24/2020 in [facility]  PHQ-2 Total Score 0 0 1 0 1  PHQ-9 Total Score 0 -- 1 0 3       Assessment and Plan:    Plan   The patient demonstrates the following risk factors for suicide: Chronic risk factors for suicide include: {Chronic Risk Factors for PIRJJOA:41660630}. Acute risk factors for suicide include: {Acute Risk Factors for ZSWFUXN:23557322}. Protective factors for this patient include: {Protective Factors for Suicide GURK:27062376}. Considering these factors, the overall suicide risk at this point appears to be {Desc; low/moderate/high:110033}. Patient {ACTION; IS/IS EGB:15176160} appropriate for outpatient follow up.    Neysa Hotter, MD 7/21/202210:18 AM

## 2021-01-12 ENCOUNTER — Telehealth: Payer: Medicare Other | Admitting: Psychiatry

## 2021-01-12 ENCOUNTER — Telehealth: Payer: Self-pay | Admitting: Psychiatry

## 2021-01-12 ENCOUNTER — Other Ambulatory Visit: Payer: Self-pay

## 2021-01-12 NOTE — Telephone Encounter (Signed)
Sent link for video visit through Epic. Patient did not sign in. Called the patient for appointment scheduled today. The patient answered the phone, and his mother's phone does not work either for video visit. Although he states that he is planning to come for in person visit today, this is not possible given this clinician would not be able to do in person visit today. He talks about his Designer, fashion/clothing, and he needs "okay" from this clinician to his PCP so that she can signs his paperwork. This clinician explained to him that I would not be able to do so without evaluating him. He states that he also needs to arrange transportation to do in person visit as he does not have any.  Although it was offered to do in person visit in late August, he reports his preference to have sooner visit. While trying to figure out any possible way to get him in, he states that he would call us back, and abruptly ended the call, using profanity.

## 2021-01-29 ENCOUNTER — Ambulatory Visit: Payer: Medicare Other | Admitting: Family Medicine

## 2021-02-02 ENCOUNTER — Telehealth: Payer: Self-pay | Admitting: Oncology

## 2021-02-02 NOTE — Telephone Encounter (Signed)
Dr. Tasia Catchings would like for him to have repeat blood work to recheck levels, prior to r/s the bone marrow biopsy (only done in mornings). If he wants to see her for discussion, that will be ok too. Will you call to schedule please.

## 2021-02-02 NOTE — Telephone Encounter (Signed)
Patient called to reschedule Bone Marrow Biopsy. He also stated that he does not have transportation and cannot do early mornings.   Routing to clinical team to advise.   Patient [id number]

## 2021-02-03 NOTE — Telephone Encounter (Signed)
Spoke with patient-he is scheduled for labs and MD on 8/30.

## 2021-02-04 NOTE — Telephone Encounter (Signed)
Please see info below.

## 2021-02-15 ENCOUNTER — Other Ambulatory Visit: Payer: Self-pay

## 2021-02-15 DIAGNOSIS — D72829 Elevated white blood cell count, unspecified: Secondary | ICD-10-CM

## 2021-02-16 ENCOUNTER — Other Ambulatory Visit: Payer: Self-pay

## 2021-02-16 ENCOUNTER — Inpatient Hospital Stay (HOSPITAL_BASED_OUTPATIENT_CLINIC_OR_DEPARTMENT_OTHER): Payer: Medicare Other | Admitting: Oncology

## 2021-02-16 ENCOUNTER — Inpatient Hospital Stay: Payer: Medicare Other | Attending: Oncology

## 2021-02-16 ENCOUNTER — Encounter: Payer: Self-pay | Admitting: Oncology

## 2021-02-16 VITALS — BP 138/85 | HR 74 | Temp 97.1°F | Resp 18 | Wt 175.5 lb

## 2021-02-16 DIAGNOSIS — D72829 Elevated white blood cell count, unspecified: Secondary | ICD-10-CM | POA: Diagnosis present

## 2021-02-16 DIAGNOSIS — D72821 Monocytosis (symptomatic): Secondary | ICD-10-CM | POA: Insufficient documentation

## 2021-02-16 DIAGNOSIS — M2550 Pain in unspecified joint: Secondary | ICD-10-CM

## 2021-02-16 DIAGNOSIS — R61 Generalized hyperhidrosis: Secondary | ICD-10-CM | POA: Diagnosis not present

## 2021-02-16 DIAGNOSIS — F17210 Nicotine dependence, cigarettes, uncomplicated: Secondary | ICD-10-CM | POA: Diagnosis not present

## 2021-02-16 LAB — CBC WITH DIFFERENTIAL/PLATELET
Abs Immature Granulocytes: 0.03 10*3/uL (ref 0.00–0.07)
Basophils Absolute: 0.2 10*3/uL — ABNORMAL HIGH (ref 0.0–0.1)
Basophils Relative: 1 %
Eosinophils Absolute: 0.6 10*3/uL — ABNORMAL HIGH (ref 0.0–0.5)
Eosinophils Relative: 4 %
HCT: 45.1 % (ref 39.0–52.0)
Hemoglobin: 16.1 g/dL (ref 13.0–17.0)
Immature Granulocytes: 0 %
Lymphocytes Relative: 20 %
Lymphs Abs: 3 10*3/uL (ref 0.7–4.0)
MCH: 33.9 pg (ref 26.0–34.0)
MCHC: 35.7 g/dL (ref 30.0–36.0)
MCV: 94.9 fL (ref 80.0–100.0)
Monocytes Absolute: 0.8 10*3/uL (ref 0.1–1.0)
Monocytes Relative: 6 %
Neutro Abs: 10.5 10*3/uL — ABNORMAL HIGH (ref 1.7–7.7)
Neutrophils Relative %: 69 %
Platelets: 303 10*3/uL (ref 150–400)
RBC: 4.75 MIL/uL (ref 4.22–5.81)
RDW: 12.9 % (ref 11.5–15.5)
WBC: 15.1 10*3/uL — ABNORMAL HIGH (ref 4.0–10.5)
nRBC: 0 % (ref 0.0–0.2)

## 2021-02-16 LAB — COMPREHENSIVE METABOLIC PANEL
ALT: 34 U/L (ref 0–44)
AST: 27 U/L (ref 15–41)
Albumin: 4.5 g/dL (ref 3.5–5.0)
Alkaline Phosphatase: 62 U/L (ref 38–126)
Anion gap: 8 (ref 5–15)
BUN: 10 mg/dL (ref 6–20)
CO2: 28 mmol/L (ref 22–32)
Calcium: 9.4 mg/dL (ref 8.9–10.3)
Chloride: 100 mmol/L (ref 98–111)
Creatinine, Ser: 0.94 mg/dL (ref 0.61–1.24)
GFR, Estimated: >60 mL/min (ref >60)
Glucose, Bld: 116 mg/dL — ABNORMAL HIGH (ref 70–99)
Potassium: 4.4 mmol/L (ref 3.5–5.1)
Sodium: 136 mmol/L (ref 135–145)
Total Bilirubin: 0.3 mg/dL (ref 0.3–1.2)
Total Protein: 7.2 g/dL (ref 6.5–8.1)

## 2021-02-16 LAB — TECHNOLOGIST SMEAR REVIEW: Plt Morphology: NORMAL

## 2021-02-16 LAB — LACTATE DEHYDROGENASE: LDH: 143 U/L (ref 98–192)

## 2021-02-16 NOTE — Progress Notes (Signed)
Hematology/Oncology Follow Up Note [facility]  [phone] [phone]  Patient Care Team: Delsa Grana, PA-C as PCP - General (Family Medicine) Minna Merritts, MD as PCP - Cardiology (Cardiology) Earlie Server, MD as Consulting Physician (Oncology)   Name of the patient: David Phillips  [id number]  Jan 18, 1986   REASON FOR VISIT  reestablish care for evaluation of leukocytosis,  family history of cancer  INTERVAL HISTORY Patient was previously seen by Dr. Rogue Bussing for leukocytosis. Patient has long history of chronic leukocytosis since at least 2013.  He has had work-up done with Dr. Rogue Bussing which showed negative BCR ABL, negative flow cytometry.  It was felt that leukocytosis most likely secondary to his smoking.  Patient also reports significant family history including stomach cancer in his grandfather.  Also grandmother had a history of skin cancer which metastasis to bones.  He also reports abdominal pain, generalized, no exacerbating or alleviating factors.  Chronic onset, ongoing symptoms for the past 1 year.  Occasionally he has drenching night sweats.  Denies any fever.  He feels some fatigue after previous Covid infection.  Denies any bowel changes, black or bloody bowel movement. He reports that his weight fluctuates between 175-185 pounds depends on his appetite.  INTERVAL HISTORY Trei Schoch is a 35 y.o. male who has above history reviewed by me today presents for follow up visit for leukocytosis. Patient reports generalized body pain.  Not much night sweats recently.  No abdominal pain. Also reports COVID-19 infection few months ago.  Weight has been stable.  Appetite is fair.   Review of Systems  Constitutional:  Positive for fatigue. Negative for appetite change, chills, fever and unexpected weight change.  HENT:   Negative for hearing loss and voice change.   Eyes:  Negative for eye problems and icterus.   Respiratory:  Negative for chest tightness, cough and shortness of breath.   Cardiovascular:  Negative for chest pain and leg swelling.  Gastrointestinal:  Negative for abdominal distention and abdominal pain.  Endocrine: Negative for hot flashes.  Genitourinary:  Negative for difficulty urinating, dysuria and frequency.   Musculoskeletal:  Positive for arthralgias.       Body aches  Skin:  Negative for itching and rash.  Neurological:  Negative for light-headedness and numbness.  Hematological:  Negative for adenopathy. Does not bruise/bleed easily.  Psychiatric/Behavioral:  Negative for confusion.      Allergies  Allergen Reactions   Prednisone Nausea And Vomiting     Past Medical History:  Diagnosis Date   Essential hypertension    Family history of cancer    Family history of stomach cancer    Gastric ulcer    Hyperlipidemia    Schizoaffective disorder ([place])      History reviewed. No pertinent surgical history.  Social History   Socioeconomic History   Marital status: Single    Spouse name: Not on file   Number of children: 0   Years of education: Not on file   Highest education level: Not on file  Occupational History   Occupation: looking for a Lobbyist job   Occupation: disability  Tobacco Use   Smoking status: Every Day    Packs/day: 2.00    Years: 20.00    Pack years: 40.00    Types: Cigarettes    Start date: 1998   Smokeless tobacco: Former   Tobacco comments:    currently at 1 pack daily--01/08/2020; has tried chantix, patches and gum without success  Vaping Use   Vaping Use: Former  Substance and Sexual Activity   Alcohol use: Yes    Alcohol/week: 6.0 standard drinks    Types: 6 Cans of beer per week    Comment: two 40 oz beers per night   Drug use: Yes    Comment: h/o drug use in the past.   Sexual activity: Not Currently  Other Topics Concern   Not on file  Social History Narrative   Not on file   Social Determinants of Health    Financial Resource Strain: Medium Risk   Difficulty of Paying Living Expenses: Somewhat hard  Food Insecurity: No Food Insecurity   Worried About Charity fundraiser in the Last Year: Never true   Arboriculturist in the Last Year: Never true  Transportation Needs: Public librarian (Medical): Yes   Lack of Transportation (Non-Medical): Yes  Physical Activity: Inactive   Days of Exercise per Week: 0 days   Minutes of Exercise per Session: 0 min  Stress: Stress Concern Present   Feeling of Stress : Rather much  Social Connections: Unknown   Frequency of Communication with Friends and Family: Not on file   Frequency of Social Gatherings with Friends and Family: Not on file   Attends Religious Services: Not on file   Active Member of Com[employer] or Organizations: Not on file   Attends Archivist Meetings: Not on file   Marital Status: Never married  Intimate Partner Violence: Not At Risk   Fear of Current or Ex-Partner: No   Emotionally Abused: No   Physically Abused: No   Sexually Abused: No    Family History  Problem Relation Age of Onset   Diabetes Mother    Diabetes Father    Heart attack Father 23       Bypass surgeries; 3 MI's   Schizophrenia Other    Heart attack Paternal Grandfather 71   Stomach cancer Maternal Grandfather    Other Neg Hx      Current Outpatient Medications:    albuterol (VENTOLIN HFA) 108 (90 Base) MCG/ACT inhaler, Inhale 2 puffs into the lungs every 4 (four) hours as needed for wheezing or shortness of breath (coughing fits)., Disp: 18 g, Rfl: 1   atorvastatin (LIPITOR) 20 MG tablet, TAKE 1 TABLET(20 MG) BY MOUTH DAILY, Disp: 90 tablet, Rfl: 0   betamethasone valerate (VALISONE) 0.1 % cream, , Disp: , Rfl: 3   hydrOXYzine (ATARAX/VISTARIL) 50 MG tablet, Take 1 tablet (50 mg total) by mouth 3 (three) times daily as needed. (Patient taking differently: Take 50 mg by mouth every 6 (six) hours as needed.), Disp:  30 tablet, Rfl: 0   losartan (COZAAR) 25 MG tablet, TAKE 2 TABLETS BY MOUTH DAILY, Disp: 180 tablet, Rfl: 0   Lurasidone HCl 120 MG TABS, Take 1 tablet by mouth daily., Disp: , Rfl:   Physical exam:  Vitals:   02/16/21 1425  BP: 138/85  Pulse: 74  Resp: 18  Temp: (!) 97.1 F (36.2 C)  Weight: 175 lb 8 oz (79.6 kg)   Physical Exam Constitutional:      General: He is not in acute distress. HENT:     Head: Normocephalic and atraumatic.  Eyes:     General: No scleral icterus. Cardiovascular:     Rate and Rhythm: Normal rate and regular rhythm.     Heart sounds: Normal heart sounds.  Pulmonary:     Effort: Pulmonary effort  is normal. No respiratory distress.     Breath sounds: No wheezing.  Abdominal:     General: Bowel sounds are normal. There is no distension.     Palpations: Abdomen is soft.  Musculoskeletal:        General: No deformity. Normal range of motion.     Cervical back: Normal range of motion and neck supple.  Skin:    General: Skin is warm and dry.     Findings: No erythema or rash.  Neurological:     Mental Status: He is alert and oriented to person, place, and time. Mental status is at baseline.     Cranial Nerves: No cranial nerve deficit.     Coordination: Coordination normal.  Psychiatric:     Comments: Anxious    CMP Latest Ref Rng & Units 02/16/2021  Glucose 70 - 99 mg/dL 116(H)  BUN 6 - 20 mg/dL 10  Creatinine 0.61 - 1.24 mg/dL 0.94  Sodium 135 - 145 mmol/L 136  Potassium 3.5 - 5.1 mmol/L 4.4  Chloride 98 - 111 mmol/L 100  CO2 22 - 32 mmol/L 28  Calcium 8.9 - 10.3 mg/dL 9.4  Total Protein 6.5 - 8.1 g/dL 7.2  Total Bilirubin 0.3 - 1.2 mg/dL 0.3  Alkaline Phos 38 - 126 U/L 62  AST 15 - 41 U/L 27  ALT 0 - 44 U/L 34   CBC Latest Ref Rng & Units 02/16/2021  WBC 4.0 - 10.5 K/uL 15.1(H)  Hemoglobin 13.0 - 17.0 g/dL 16.1  Hematocrit 39.0 - 52.0 % 45.1  Platelets 150 - 400 K/uL 303    RADIOGRAPHIC STUDIES: I have personally reviewed the  radiological images as listed and agreed with the findings in the report. No results found.   Assessment and plan  1. Leukocytosis, unspecified type   2. Monocytosis   3. Arthralgia, unspecified joint    #Chronic leukocytosis, predominantly neutrophilia.  Chronic monocytosis, basophilia.  This is likely due to smoking.  Monocytosis improved on recent blood work.   Patient hasMultiple complaints including bone pain, fluctuated weight, Previous history of night sweats. Patient desires to have bone marrow biopsy done for further evaluation, rule out possible underlying bone marrow disorder.  We will obtain. #Chronic arthralgia, Check ANA, CRP, ESR.Check ferritin, iron TIBC ferritin. #Tobacco use, Smoke cessation was discussed. #Chronic alcohol use, alcohol cessation was discussed. Orders Placed This Encounter  Procedures   ANA, IFA (with reflex)    Standing Status:   Future    Standing Expiration Date:   02/16/2022   Sedimentation rate    Standing Status:   Future    Standing Expiration Date:   02/16/2022   B. burgdorfi antibodies ([place], total Ab test/reflex)    Standing Status:   Future    Standing Expiration Date:   02/16/2022   C-reactive protein    Standing Status:   Future    Standing Expiration Date:   02/16/2022   Iron and TIBC    Standing Status:   Future    Standing Expiration Date:   02/16/2022   Ferritin    Standing Status:   Future    Standing Expiration Date:   02/16/2022    Follow-up in 2 weeks after bone marrow biopsy to go over results.   Earlie Server, MD, PhD Hematology Oncology [facility] at [facility] Pager- [phone] 02/16/2021

## 2021-02-16 NOTE — Progress Notes (Signed)
Patient here for follow up. Pt reports pain all over body and states that sometimes he cant want and feels like he is going to fall.

## 2021-02-17 LAB — EBV AB TO VIRAL CAPSID AG PNL, IGG+IGM
EBV VCA IgG: 309 U/mL — ABNORMAL HIGH (ref 0.0–17.9)
EBV VCA IgM: <36 U/mL (ref 0.0–35.9)

## 2021-02-19 ENCOUNTER — Telehealth: Payer: Self-pay

## 2021-02-19 ENCOUNTER — Inpatient Hospital Stay: Payer: Medicare Other

## 2021-02-19 NOTE — Telephone Encounter (Signed)
Patient scheduled for BMbx on 9/9 @ 9:30 with arrival time 8:30. Pt notified.   Please schedule MD approx 2 weeks after biopsy and notify pt of appt.

## 2021-02-24 ENCOUNTER — Other Ambulatory Visit: Payer: Self-pay

## 2021-02-24 ENCOUNTER — Inpatient Hospital Stay: Payer: Medicare Other | Attending: Oncology

## 2021-02-24 DIAGNOSIS — D72821 Monocytosis (symptomatic): Secondary | ICD-10-CM | POA: Diagnosis not present

## 2021-02-24 DIAGNOSIS — D72829 Elevated white blood cell count, unspecified: Secondary | ICD-10-CM

## 2021-02-24 DIAGNOSIS — M2550 Pain in unspecified joint: Secondary | ICD-10-CM | POA: Insufficient documentation

## 2021-02-24 DIAGNOSIS — Z808 Family history of malignant neoplasm of other organs or systems: Secondary | ICD-10-CM | POA: Insufficient documentation

## 2021-02-24 DIAGNOSIS — F17210 Nicotine dependence, cigarettes, uncomplicated: Secondary | ICD-10-CM | POA: Diagnosis not present

## 2021-02-24 DIAGNOSIS — E611 Iron deficiency: Secondary | ICD-10-CM | POA: Insufficient documentation

## 2021-02-24 DIAGNOSIS — R1084 Generalized abdominal pain: Secondary | ICD-10-CM | POA: Insufficient documentation

## 2021-02-24 DIAGNOSIS — R61 Generalized hyperhidrosis: Secondary | ICD-10-CM | POA: Insufficient documentation

## 2021-02-24 DIAGNOSIS — R5383 Other fatigue: Secondary | ICD-10-CM | POA: Insufficient documentation

## 2021-02-24 DIAGNOSIS — Z800 Family history of malignant neoplasm of digestive organs: Secondary | ICD-10-CM | POA: Diagnosis not present

## 2021-02-24 LAB — SEDIMENTATION RATE: Sed Rate: 2 mm/hr (ref 0–15)

## 2021-02-24 LAB — IRON AND TIBC
Iron: 43 ug/dL — ABNORMAL LOW (ref 45–182)
Saturation Ratios: 11 % — ABNORMAL LOW (ref 17.9–39.5)
TIBC: 409 ug/dL (ref 250–450)
UIBC: 366 ug/dL

## 2021-02-24 LAB — FERRITIN: Ferritin: 88 ng/mL (ref 24–336)

## 2021-02-24 LAB — C-REACTIVE PROTEIN: CRP: 0.8 mg/dL (ref <1.0)

## 2021-02-24 NOTE — Progress Notes (Signed)
Called David Phillips (patient) at [phone] today 02/24/2021 at 1430 and spoke with him regarding scheduled procedure of bone marrow biopsy for Friday 02/26/2021.  Discussed: Arrival time of 8:15am to registration at [facility] in the [facility], patient verbalized arrival time of 8:15am; Medication detail: okay to take morning meds with a small sip of water, NPO after midnight Thursday night into day of procedure, Need of responsible driver to drive home, No valuables, Comfortable clothing and patient verbalized again the arrival time of 8:15am, and verbalized understanding of NPO instructions and only sip of water with morning meds.  Questions answered.  

## 2021-02-25 ENCOUNTER — Other Ambulatory Visit: Payer: Self-pay | Admitting: Radiology

## 2021-02-25 LAB — MISC LABCORP TEST (SEND OUT): Labcorp test code: 164226

## 2021-02-26 ENCOUNTER — Ambulatory Visit
Admission: RE | Admit: 2021-02-26 | Discharge: 2021-02-26 | Disposition: A | Discharge status: Home / Self Care | Payer: Medicare Other | Source: Ambulatory Visit | Attending: Oncology | Admitting: Oncology

## 2021-02-26 ENCOUNTER — Telehealth: Payer: Self-pay | Admitting: *Deleted

## 2021-02-26 ENCOUNTER — Ambulatory Visit: Payer: Self-pay | Admitting: *Deleted

## 2021-02-26 ENCOUNTER — Other Ambulatory Visit: Payer: Self-pay

## 2021-02-26 DIAGNOSIS — D72829 Elevated white blood cell count, unspecified: Secondary | ICD-10-CM | POA: Diagnosis not present

## 2021-02-26 DIAGNOSIS — D72821 Monocytosis (symptomatic): Secondary | ICD-10-CM | POA: Insufficient documentation

## 2021-02-26 LAB — CBC WITH DIFFERENTIAL/PLATELET
Abs Immature Granulocytes: 0.04 10*3/uL (ref 0.00–0.07)
Basophils Absolute: 0.1 10*3/uL (ref 0.0–0.1)
Basophils Relative: 1 %
Eosinophils Absolute: 0.5 10*3/uL (ref 0.0–0.5)
Eosinophils Relative: 4 %
HCT: 44.6 % (ref 39.0–52.0)
Hemoglobin: 16.5 g/dL (ref 13.0–17.0)
Immature Granulocytes: 0 %
Lymphocytes Relative: 31 %
Lymphs Abs: 4.1 10*3/uL — ABNORMAL HIGH (ref 0.7–4.0)
MCH: 35 pg — ABNORMAL HIGH (ref 26.0–34.0)
MCHC: 37 g/dL — ABNORMAL HIGH (ref 30.0–36.0)
MCV: 94.5 fL (ref 80.0–100.0)
Monocytes Absolute: 1 10*3/uL (ref 0.1–1.0)
Monocytes Relative: 7 %
Neutro Abs: 7.7 10*3/uL (ref 1.7–7.7)
Neutrophils Relative %: 57 %
Platelets: 258 10*3/uL (ref 150–400)
RBC: 4.72 MIL/uL (ref 4.22–5.81)
RDW: 13.3 % (ref 11.5–15.5)
WBC: 13.5 10*3/uL — ABNORMAL HIGH (ref 4.0–10.5)
nRBC: 0 % (ref 0.0–0.2)

## 2021-02-26 MED ORDER — MIDAZOLAM HCL 5 MG/5ML IJ SOLN
INTRAMUSCULAR | Status: DC | PRN
  Administered 2021-02-26: 1 mg via INTRAVENOUS

## 2021-02-26 MED ORDER — HEPARIN SOD (PORK) LOCK FLUSH 100 UNIT/ML IV SOLN
INTRAVENOUS | Status: AC
  Filled 2021-02-26: qty 5

## 2021-02-26 MED ORDER — SODIUM CHLORIDE 0.9 % IV SOLN: INTRAVENOUS | Status: DC

## 2021-02-26 MED ORDER — MIDAZOLAM HCL 2 MG/2ML IJ SOLN
INTRAMUSCULAR | Status: DC | PRN
Start: 2021-02-26 — End: 2021-02-27
  Administered 2021-02-26 (×2): 1 mg via INTRAVENOUS

## 2021-02-26 MED ORDER — FENTANYL CITRATE (PF) 100 MCG/2ML IJ SOLN
INTRAMUSCULAR | Status: DC | PRN
  Administered 2021-02-26 (×2): 50 ug via INTRAVENOUS

## 2021-02-26 MED ORDER — MIDAZOLAM HCL 2 MG/2ML IJ SOLN
INTRAMUSCULAR | Status: AC
  Filled 2021-02-26: qty 4

## 2021-02-26 MED ORDER — FENTANYL CITRATE (PF) 100 MCG/2ML IJ SOLN
INTRAMUSCULAR | Status: AC
  Filled 2021-02-26: qty 2

## 2021-02-26 NOTE — H&P (Signed)
Vascular and Interventional Radiology  PRE PROCEDURE H&P  Assessment  Plan:   [name] is a 35 y.o. year old male who will undergo BONE MARROW ASPIRATION AND BIOPSY in Interventional Radiology.  The procedure has been fully reviewed with the patient/patient's authorized representative. The risks, benefits and alternatives have been explained, and the patient/patient's authorized representative has consented to the procedure. -- The patient will accept blood products in an emergent situation. -- The patient does not have a Do Not Resuscitate order in effect.  HPI: [name] is a 35 y.o. year old male with PMHx significant for persistent monocytosis. BM Bx requested for additional workup. Informed consent was obtained, witnessed and placed in the patient's chart.  Allergies:  Allergies  Allergen Reactions   Prednisone Nausea And Vomiting    Medications:  Current Outpatient Medications on File Prior to Encounter  Medication Sig Dispense Refill   albuterol (VENTOLIN HFA) 108 (90 Base) MCG/ACT inhaler Inhale 2 puffs into the lungs every 4 (four) hours as needed for wheezing or shortness of breath (coughing fits). 18 g 1   atorvastatin (LIPITOR) 20 MG tablet TAKE 1 TABLET(20 MG) BY MOUTH DAILY 90 tablet 0   betamethasone valerate (VALISONE) 0.1 % cream   3   losartan (COZAAR) 25 MG tablet TAKE 2 TABLETS BY MOUTH DAILY 180 tablet 0   Lurasidone HCl 120 MG TABS Take 1 tablet by mouth daily.     hydrOXYzine (ATARAX/VISTARIL) 50 MG tablet Take 1 tablet (50 mg total) by mouth 3 (three) times daily as needed. (Patient taking differently: Take 50 mg by mouth every 6 (six) hours as needed.) 30 tablet 0   No current facility-administered medications on file prior to encounter.    PSH: No past surgical history on file.  PMH:  Past Medical History:  Diagnosis Date   Essential hypertension    Family history of cancer    Family history of stomach cancer    Gastric ulcer     Hyperlipidemia    Schizoaffective disorder ([facility])     Brief Physical Examination: Vitals:   02/26/21 0857  BP: 139/87  Pulse: 78  Resp: 20  Temp: 98.1 F (36.7 C)  SpO2: 96%   General: WD, WN male in NAD HEENT: Normocephalic, atraumatic Lungs: Respirations non-labored  ASA Grade: 3 Mallampati Class: 2   Roanna Banning, MD Vascular and Interventional Radiology Specialists [facility] Radiology   Pager. [phone] Clinic. [phone]

## 2021-02-26 NOTE — Telephone Encounter (Signed)
Reason for Disposition  [1] SEVERE abdominal pain AND [2] present > 1 hour  Answer Assessment - Initial Assessment Questions 1. ONSET: "When did the pain begin?"      After bone marrow biopsy  2. LOCATION: "Where does it hurt?" (upper, mid or lower back)     Low back where biopsy done  3. SEVERITY: "How bad is the pain?"  (e.g., Scale 1-10; mild, moderate, or severe)   - MILD (1-3): doesn't interfere with normal activities    - MODERATE (4-7): interferes with normal activities or awakens from sleep    - SEVERE (8-10): excruciating pain, unable to do any normal activities      severe 4. PATTERN: "Is the pain constant?" (e.g., yes, no; constant, intermittent)      Na  5. RADIATION: "Does the pain shoot into your legs or elsewhere?"     na 6. CAUSE:  "What do you think is causing the back pain?"      Bone marrow biopsy  7. BACK OVERUSE:  "Any recent lifting of heavy objects, strenuous work or exercise?"     No  8. MEDICATIONS: "What have you taken so far for the pain?" (e.g., nothing, acetaminophen, NSAIDS)     None  9. NEUROLOGIC SYMPTOMS: "Do you have any weakness, numbness, or problems with bowel/bladder control?"     Weakness in knees difficulty walking due to pain 10. OTHER SYMPTOMS: "Do you have any other symptoms?" (e.g., fever, abdominal pain, burning with urination, blood in urine)       no 11. PREGNANCY: "Is there any chance you are pregnant?" (e.g., yes, no; LMP)       na  Protocols used: Back Pain-A-AH

## 2021-02-26 NOTE — Telephone Encounter (Signed)
Patient called reporting that the numbing medicine is wearing off from his BMBX today and that he is having pain in his back and lives alone and asking for pain medicine to help him et through the next week that is not Meloxicam because it upsets his stomach. Please advise

## 2021-02-26 NOTE — Procedures (Signed)
Vascular and Interventional Radiology Procedure Note  Patient: Glade Strausser DOB: 11-22-1985 Medical Record Number: [id number] Note Date/Time: 02/26/21 10:30 AM   Performing Physician: Michaelle Birks, MD Assistant(s): None  Diagnosis: Monocytosis  Procedure: BONE MARROW ASPIRATION AND BIOPSY  Anesthesia: Conscious Sedation Complications: None Estimated Blood Loss: Minimal Specimens: Sent for Pathology  Findings:  Successful CT-guided bone marrow biopsy A total of 2 cores were obtained. Hemostasis of the tract was achieved using Manual Pressure.  Plan: Bed rest for 1 hours.  See detailed procedure note with images in PACS. The patient tolerated the procedure well without incident or complication and was returned to Recovery in stable condition.    Michaelle Birks, MD Vascular and Interventional Radiology Specialists [facility] Radiology   Pager. [place]

## 2021-02-26 NOTE — Telephone Encounter (Signed)
C/o severe low back pain after bone marrow biopsy this am. Medication "wearing off" per patient. Difficulty walking due to pain . Knees weak. Was told after biopsy to contact PCP for pain medication if needed. Patient reports he is able to eat and drink. Denies headache, chest pain , difficulty breathing. Able to urinate. Patient reports if medication can be called in his mother will pick up from pharmacy. Please advise . Care advise given . Patient verbalized understanding of care advise and to call back or go to ED if symptoms worsen. No available virtual visit noted.

## 2021-02-26 NOTE — Telephone Encounter (Signed)
Contacted pt to notify that he needs to contact the office that did the procedure for pain medication or go to ER/Urgent care.

## 2021-03-01 ENCOUNTER — Telehealth: Payer: Self-pay | Admitting: Family Medicine

## 2021-03-01 DIAGNOSIS — I10 Essential (primary) hypertension: Secondary | ICD-10-CM

## 2021-03-01 NOTE — Telephone Encounter (Signed)
Pt scheduled next available pm slot, still requesting this medication and  Lurasidone HCl 120 MG TABS

## 2021-03-01 NOTE — Telephone Encounter (Signed)
Pt need an appt for further refills, Thank you!

## 2021-03-01 NOTE — Telephone Encounter (Signed)
Lvm to let patient know that rx was refilled but any other refills will need an appt per Provider

## 2021-03-02 ENCOUNTER — Other Ambulatory Visit: Payer: Self-pay | Admitting: Family Medicine

## 2021-03-02 DIAGNOSIS — I10 Essential (primary) hypertension: Secondary | ICD-10-CM

## 2021-03-02 MED ORDER — LOSARTAN POTASSIUM 25 MG PO TABS: ORAL_TABLET | ORAL | 0 refills | Status: DC

## 2021-03-02 NOTE — Addendum Note (Signed)
Addended by: Wilford Corner on: 03/02/2021 05:34 PM   Modules accepted: Orders

## 2021-03-02 NOTE — Telephone Encounter (Signed)
[facility] pharmacy called asking for short supply of losartan until his appt on 09/19

## 2021-03-02 NOTE — Telephone Encounter (Signed)
Mychart message sent yesterday to follow up on pain

## 2021-03-02 NOTE — Telephone Encounter (Signed)
losartan (COZAAR) 25 MG tablet 180 tablet 0 12/02/2020    Sig: TAKE 2 TABLETS BY MOUTH DAILY   Sent to pharmacy as: losartan (COZAAR) 25 MG tablet   E-Prescribing Status: Receipt confirmed by pharmacy (12/02/2020  8:50 AM EDT)   Pt states that he had called this in yesterday and was told it was sent to pharmacy. States he is out. States that pharmacy has not received any?  [id number] [place], [place] - [address] AT [facility]  [address] [place] [address]  Phone: [phone] Fax: [phone]  Hours: Not open 24 hours  Pt had made appt, pls fu with pt with question as seems mother is also calling. Pt # [phone]

## 2021-03-03 ENCOUNTER — Telehealth: Payer: Self-pay

## 2021-03-03 LAB — ANTINUCLEAR ANTIBODIES, IFA: ANA Ab, IFA: NEGATIVE

## 2021-03-03 MED ORDER — LOSARTAN POTASSIUM 25 MG PO TABS: ORAL_TABLET | ORAL | 0 refills | Status: DC

## 2021-03-03 NOTE — Telephone Encounter (Signed)
Copied from CRM [id number]. Topic: Quick Communication - Rx Refill/Question >> Mar 02, 2021  4:33 PM Pawlus, Maxine Glenn A wrote: Pt called upset stating he will sue if anything happens to him because he can not get a refill on his losartan (COZAAR) 25 MG tablet, please advise.

## 2021-03-03 NOTE — Telephone Encounter (Signed)
Sent pt Mychart message on Monday asking him about pain level and to take Tylenol if needed. He saw message but did not respond. Also, saw where he contacted PCP regarding pain and they advised him to go to ER if pain got worse.

## 2021-03-03 NOTE — Addendum Note (Signed)
Addended by: Davene Costain on: 03/03/2021 08:35 AM   Modules accepted: Orders

## 2021-03-04 ENCOUNTER — Encounter: Payer: Self-pay | Admitting: Oncology

## 2021-03-08 ENCOUNTER — Ambulatory Visit (INDEPENDENT_AMBULATORY_CARE_PROVIDER_SITE_OTHER): Payer: Medicare Other | Admitting: Family Medicine

## 2021-03-08 ENCOUNTER — Encounter: Payer: Self-pay | Admitting: Family Medicine

## 2021-03-08 ENCOUNTER — Encounter (HOSPITAL_COMMUNITY): Payer: Self-pay | Admitting: Oncology

## 2021-03-08 ENCOUNTER — Other Ambulatory Visit: Payer: Self-pay

## 2021-03-08 VITALS — BP 122/64 | HR 80 | Temp 98.3°F | Resp 16 | Ht 69.0 in | Wt 177.2 lb

## 2021-03-08 DIAGNOSIS — E782 Mixed hyperlipidemia: Secondary | ICD-10-CM | POA: Diagnosis not present

## 2021-03-08 DIAGNOSIS — F250 Schizoaffective disorder, bipolar type: Secondary | ICD-10-CM

## 2021-03-08 DIAGNOSIS — F17200 Nicotine dependence, unspecified, uncomplicated: Secondary | ICD-10-CM | POA: Diagnosis not present

## 2021-03-08 DIAGNOSIS — I10 Essential (primary) hypertension: Secondary | ICD-10-CM | POA: Diagnosis not present

## 2021-03-08 MED ORDER — ATORVASTATIN CALCIUM 20 MG PO TABS: 20.0000 mg | ORAL_TABLET | Freq: Every day | ORAL | 3 refills | Status: DC

## 2021-03-08 MED ORDER — LOSARTAN POTASSIUM 50 MG PO TABS: 50.0000 mg | ORAL_TABLET | Freq: Every day | ORAL | 3 refills | Status: DC

## 2021-03-08 MED ORDER — LURASIDONE HCL 120 MG PO TABS: 120.0000 mg | ORAL_TABLET | Freq: Every day | ORAL | 0 refills | Status: DC

## 2021-03-08 NOTE — Progress Notes (Signed)
Name: David Phillips   MRN: [id number]    DOB: Apr 03, 1986   Date:03/08/2021       Progress Note  Chief Complaint  Patient presents with   Hypertension   Hyperlipidemia   Manic Behavior    Wants latuda refilled     Subjective:   David Phillips is a 35 y.o. male, presents to clinic for routine follow-up  Hypertension:  Currently managed on losartan 25 mg Pt reports good med compliance and denies any SE.   Blood pressure today is well controlled. BP Readings from Last 3 Encounters:  03/08/21 122/64  02/26/21 (!) 148/97  02/16/21 138/85   Pt denies CP, SOB, exertional sx, LE edema, palpitation, Ha's, visual disturbances, lightheadedness, hypotension, syncope.   Hyperlipidemia: Currently treated with Lipitor 20, pt reports good med compliance Lipid panel and prior results reviewed LDL significantly improved labs done earlier this year Last Lipids: Lab Results  Component Value Date   CHOL 154 07/23/2020   HDL 52 07/23/2020   LDLCALC 64 07/23/2020   TRIG 319 (H) 07/23/2020   CHOLHDL 3.0 07/23/2020   - Denies: Chest pain, shortness of breath, myalgias, claudication  Patient has history of schizoaffective disorder bipolar type, asked that his Latuda be refilled and sent in for him since he is not going to be able to see the psychiatrist in time for needed refill he states that his mood and symptoms are stable he reviewed dosing per chart and this was confirmed with pharmacy  [facility] [address]   Current Outpatient Medications:    albuterol (VENTOLIN HFA) 108 (90 Base) MCG/ACT inhaler, Inhale 2 puffs into the lungs every 4 (four) hours as needed for wheezing or shortness of breath (coughing fits)., Disp: 18 g, Rfl: 1   atorvastatin (LIPITOR) 20 MG tablet, TAKE 1 TABLET(20 MG) BY MOUTH DAILY, Disp: 90 tablet, Rfl: 0   betamethasone valerate (VALISONE) 0.1 % cream, , Disp: , Rfl: 3   hydrOXYzine (ATARAX/VISTARIL) 50 MG tablet, Take 1 tablet (50 mg total) by mouth 3  (three) times daily as needed. (Patient taking differently: Take 50 mg by mouth every 6 (six) hours as needed.), Disp: 30 tablet, Rfl: 0   losartan (COZAAR) 25 MG tablet, TAKE 2 TABLETS BY MOUTH DAILY, Disp: 30 tablet, Rfl: 0   Lurasidone HCl 120 MG TABS, Take 1 tablet by mouth daily., Disp: , Rfl:   Patient Active Problem List   Diagnosis Date Noted   Family history of cancer    COPD suggested by initial evaluation (HCC) 07/23/2020   Gastroesophageal reflux disease 07/23/2020   Family history of stomach cancer 04/27/2020   Chest pain 05/29/2018   Hyperlipidemia 03/19/2018   Chronic left hip pain 02/12/2018   Chronic left-sided low back pain without sciatica 02/12/2018   Neutrophilia 02/12/2018   Schizoaffective disorder, bipolar type (HCC) 02/12/2018   Encounter for tobacco use cessation counseling 02/12/2018   Overweight (BMI 25.0-29.9) 02/12/2018   Allergic rhinitis 03/23/2017   Essential hypertension 02/23/2017    History reviewed. No pertinent surgical history.  Family History  Problem Relation Age of Onset   Diabetes Mother    Diabetes Father    Heart attack Father 72       Bypass surgeries; 3 MI's   Schizophrenia Other    Heart attack Paternal Grandfather 98   Stomach cancer Maternal Grandfather    Other Neg Hx     Social History   Tobacco Use   Smoking status: Every Day    Packs/day:  2.00    Years: 20.00    Pack years: 40.00    Types: Cigarettes    Start date: 1998   Smokeless tobacco: Former   Tobacco comments:    currently at 1 pack daily--01/08/2020; has tried chantix, patches and gum without success  Vaping Use   Vaping Use: Former  Substance Use Topics   Alcohol use: Yes    Alcohol/week: 6.0 standard drinks    Types: 6 Cans of beer per week    Comment: two 40 oz beers per night   Drug use: Yes    Comment: h/o drug use in the past.     Allergies  Allergen Reactions   Prednisone Nausea And Vomiting    Health Maintenance  Topic Date Due    COVID-19 Vaccine (1) 03/24/2021 (Originally 09/25/1986)   INFLUENZA VACCINE  09/17/2021 (Originally 01/18/2021)   TETANUS/TDAP  02/13/2028   Hepatitis C Screening  Completed   HIV Screening  Completed   HPV VACCINES  Aged Out    Chart Review Today: I have reviewed the patient's medical history in detail and updated the computerized patient record.   Review of Systems  Constitutional: Negative.   HENT: Negative.    Eyes: Negative.   Respiratory: Negative.    Cardiovascular: Negative.   Gastrointestinal: Negative.   Endocrine: Negative.   Genitourinary: Negative.   Musculoskeletal: Negative.   Skin: Negative.   Allergic/Immunologic: Negative.   Neurological: Negative.   Hematological: Negative.   Psychiatric/Behavioral: Negative.    All other systems reviewed and are negative.   Objective:   Vitals:   03/08/21 1603  BP: 122/64  Pulse: 80  Resp: 16  Temp: 98.3 F (36.8 C)  SpO2: 98%  Weight: 177 lb 3.2 oz (80.4 kg)  Height: 5\' 9"  (1.753 m)    Body mass index is 26.17 kg/m.  Physical Exam Vitals and nursing note reviewed.  Constitutional:      General: He is not in acute distress.    Appearance: Normal appearance. He is well-developed. He is not ill-appearing, toxic-appearing or diaphoretic.     Interventions: Face mask in place.  HENT:     Head: Normocephalic and atraumatic.     Jaw: No trismus.     Right Ear: External ear normal.     Left Ear: External ear normal.  Eyes:     General: Lids are normal. No scleral icterus.       Right eye: No discharge.        Left eye: No discharge.     Conjunctiva/sclera: Conjunctivae normal.  Neck:     Trachea: Trachea and phonation normal. No tracheal deviation.  Cardiovascular:     Rate and Rhythm: Normal rate and regular rhythm.     Pulses: Normal pulses.          Radial pulses are 2+ on the right side and 2+ on the left side.       Posterior tibial pulses are 2+ on the right side and 2+ on the left side.     Heart  sounds: Normal heart sounds. No murmur heard.   No friction rub. No gallop.  Pulmonary:     Effort: Pulmonary effort is normal. No respiratory distress.     Breath sounds: Normal breath sounds. No stridor. No wheezing, rhonchi or rales.  Abdominal:     General: Bowel sounds are normal. There is no distension.     Palpations: Abdomen is soft.  Musculoskeletal:     Right lower leg:  No edema.     Left lower leg: No edema.  Skin:    General: Skin is warm and dry.     Coloration: Skin is not jaundiced.     Findings: No rash.     Nails: There is no clubbing.  Neurological:     Mental Status: He is alert. Mental status is at baseline.     Cranial Nerves: No dysarthria or facial asymmetry.     Motor: No tremor or abnormal muscle tone.     Gait: Gait normal.  Psychiatric:        Attention and Perception: He is inattentive.        Mood and Affect: Mood and affect normal.        Speech: Speech normal.        Behavior: Behavior is hyperactive. Behavior is cooperative.        Thought Content: Thought content is paranoid. Thought content is not delusional. Thought content does not include homicidal or suicidal ideation. Thought content does not include homicidal or suicidal plan.        Assessment & Plan:     ICD-10-CM   1. Mixed hyperlipidemia  E78.2 atorvastatin (LIPITOR) 20 MG tablet   Improved statin compliance no myalgias side effects or concerns, last lipids reviewed and LDL improved and is at goal due for labs next March    2. Essential hypertension  I10 losartan (COZAAR) 50 MG tablet   Stable, well-controlled, blood pressure at goal today, continue losartan, encouraged diet lifestyle efforts and smoking cessation    3. Schizoaffective disorder, bipolar type (HCC)  F25.0 Lurasidone HCl 120 MG TABS   Willing to send in 1 refill of his medication so that he can arrange for follow-up with his psychiatrist, med dosing and symptoms have been stable    4. Current smoker  F17.200    No  interest in quitting currently, resources offered again       Return in about 6 months (around 09/05/2021) for Routine follow-up HLD HTN.   [name], PA-C 03/08/21 4:36 PM

## 2021-03-15 ENCOUNTER — Encounter: Payer: Self-pay | Admitting: Oncology

## 2021-03-15 ENCOUNTER — Other Ambulatory Visit: Payer: Self-pay

## 2021-03-15 ENCOUNTER — Inpatient Hospital Stay (HOSPITAL_BASED_OUTPATIENT_CLINIC_OR_DEPARTMENT_OTHER): Payer: Medicare Other | Admitting: Oncology

## 2021-03-15 VITALS — BP 138/98 | HR 85 | Temp 97.8°F | Resp 19 | Wt 173.3 lb

## 2021-03-15 DIAGNOSIS — D72829 Elevated white blood cell count, unspecified: Secondary | ICD-10-CM | POA: Diagnosis not present

## 2021-03-15 DIAGNOSIS — D508 Other iron deficiency anemias: Secondary | ICD-10-CM

## 2021-03-15 DIAGNOSIS — Z7289 Other problems related to lifestyle: Secondary | ICD-10-CM | POA: Diagnosis not present

## 2021-03-15 DIAGNOSIS — Z809 Family history of malignant neoplasm, unspecified: Secondary | ICD-10-CM

## 2021-03-15 DIAGNOSIS — Z720 Tobacco use: Secondary | ICD-10-CM | POA: Diagnosis not present

## 2021-03-15 DIAGNOSIS — D72821 Monocytosis (symptomatic): Secondary | ICD-10-CM

## 2021-03-15 DIAGNOSIS — Z789 Other specified health status: Secondary | ICD-10-CM

## 2021-03-15 LAB — SURGICAL PATHOLOGY

## 2021-03-15 MED ORDER — VITRON-C 65-125 MG PO TABS: 1.0000 | ORAL_TABLET | Freq: Every day | ORAL | 0 refills | Status: DC

## 2021-03-15 NOTE — Progress Notes (Signed)
Hematology/Oncology Follow Up Note [facility]  [phone] [phone]  Patient Care Team: Delsa Grana, PA-C as PCP - General (Family Medicine) Minna Merritts, MD as PCP - Cardiology (Cardiology) Earlie Server, MD as Consulting Physician (Oncology)   Name of the patient: David Phillips  [id number]  [birth year]   REASON FOR VISIT  reestablish care for evaluation of leukocytosis,  family history of cancer  INTERVAL HISTORY Patient was previously seen by Dr. Rogue Bussing for leukocytosis. Patient has long history of chronic leukocytosis since at least 2013.  He has had work-up done with Dr. Rogue Bussing which showed negative BCR ABL, negative flow cytometry.  It was felt that leukocytosis most likely secondary to his smoking.  Patient also reports significant family history including stomach cancer in his grandfather.  Also grandmother had a history of skin cancer which metastasis to bones.  He also reports abdominal pain, generalized, no exacerbating or alleviating factors.  Chronic onset, ongoing symptoms for the past 1 year.  Occasionally he has drenching night sweats.  Denies any fever.  He feels some fatigue after previous Covid infection.  Denies any bowel changes, black or bloody bowel movement. He reports that his weight fluctuates between 175-185 pounds depends on his appetite.  INTERVAL HISTORY Lonzy Mato is a 35 y.o. male who has above history reviewed by me today presents for follow up visit for leukocytosis. Patient has had a bone marrow biopsy done during interval for further evaluation of his chronic leukocytosis.   Review of Systems  Constitutional:  Positive for fatigue. Negative for appetite change, chills, fever and unexpected weight change.  HENT:   Negative for hearing loss and voice change.   Eyes:  Negative for eye problems and icterus.  Respiratory:  Negative for chest tightness, cough and shortness of breath.    Cardiovascular:  Negative for chest pain and leg swelling.  Gastrointestinal:  Negative for abdominal distention and abdominal pain.  Endocrine: Negative for hot flashes.  Genitourinary:  Negative for difficulty urinating, dysuria and frequency.   Musculoskeletal:  Positive for arthralgias.       Body aches  Skin:  Negative for itching and rash.  Neurological:  Negative for light-headedness and numbness.  Hematological:  Negative for adenopathy. Does not bruise/bleed easily.  Psychiatric/Behavioral:  Negative for confusion.      Allergies  Allergen Reactions   Prednisone Nausea And Vomiting     Past Medical History:  Diagnosis Date   Essential hypertension    Family history of cancer    Family history of stomach cancer    Gastric ulcer    Hyperlipidemia    Schizoaffective disorder ([place])      History reviewed. No pertinent surgical history.  Social History   Socioeconomic History   Marital status: Single    Spouse name: Not on file   Number of children: 0   Years of education: Not on file   Highest education level: Not on file  Occupational History   Occupation: looking for a Lobbyist job   Occupation: disability  Tobacco Use   Smoking status: Every Day    Packs/day: 2.00    Years: 20.00    Pack years: 40.00    Types: Cigarettes    Start date: 1998   Smokeless tobacco: Former   Tobacco comments:    currently at 1 pack daily--01/08/2020; has tried chantix, patches and gum without success  Vaping Use   Vaping Use: Former  Substance and Sexual Activity  Alcohol use: Yes    Alcohol/week: 6.0 standard drinks    Types: 6 Cans of beer per week    Comment: two 40 oz beers per night   Drug use: Yes    Comment: h/o drug use in the past.   Sexual activity: Not Currently  Other Topics Concern   Not on file  Social History Narrative   Not on file   Social Determinants of Health   Financial Resource Strain: Medium Risk   Difficulty of Paying Living Expenses:  Somewhat hard  Food Insecurity: No Food Insecurity   Worried About Charity fundraiser in the Last Year: Never true   Arboriculturist in the Last Year: Never true  Transportation Needs: Public librarian (Medical): Yes   Lack of Transportation (Non-Medical): Yes  Physical Activity: Inactive   Days of Exercise per Week: 0 days   Minutes of Exercise per Session: 0 min  Stress: Stress Concern Present   Feeling of Stress : Rather much  Social Connections: Unknown   Frequency of Communication with Friends and Family: Not on file   Frequency of Social Gatherings with Friends and Family: Not on file   Attends Religious Services: Not on file   Active Member of [place] or Organizations: Not on file   Attends Archivist Meetings: Not on file   Marital Status: Never married  Intimate Partner Violence: Not At Risk   Fear of Current or Ex-Partner: No   Emotionally Abused: No   Physically Abused: No   Sexually Abused: No    Family History  Problem Relation Age of Onset   Diabetes Mother    Diabetes Father    Heart attack Father 16       Bypass surgeries; 3 MI's   Schizophrenia Other    Heart attack Paternal Grandfather 63   Stomach cancer Maternal Grandfather    Other Neg Hx      Current Outpatient Medications:    albuterol (VENTOLIN HFA) 108 (90 Base) MCG/ACT inhaler, Inhale 2 puffs into the lungs every 4 (four) hours as needed for wheezing or shortness of breath (coughing fits)., Disp: 18 g, Rfl: 1   atorvastatin (LIPITOR) 20 MG tablet, Take 1 tablet (20 mg total) by mouth at bedtime. TAKE 1 TABLET(20 MG) BY MOUTH DAILY, Disp: 90 tablet, Rfl: 3   betamethasone valerate (VALISONE) 0.1 % cream, , Disp: , Rfl: 3   hydrOXYzine (ATARAX/VISTARIL) 50 MG tablet, Take 1 tablet (50 mg total) by mouth 3 (three) times daily as needed. (Patient taking differently: Take 50 mg by mouth every 6 (six) hours as needed.), Disp: 30 tablet, Rfl: 0   losartan  (COZAAR) 50 MG tablet, Take 1 tablet (50 mg total) by mouth daily., Disp: 90 tablet, Rfl: 3   Lurasidone HCl 120 MG TABS, Take 1 tablet (120 mg total) by mouth daily., Disp: 90 tablet, Rfl: 0  Physical exam:  Vitals:   03/15/21 1432  BP: (!) 138/98  Pulse: 85  Resp: 19  Temp: 97.8 F (36.6 C)  SpO2: 99%  Weight: 173 lb 4.8 oz (78.6 kg)   Physical Exam Constitutional:      General: He is not in acute distress. HENT:     Head: Normocephalic and atraumatic.  Eyes:     General: No scleral icterus. Cardiovascular:     Rate and Rhythm: Normal rate and regular rhythm.     Heart sounds: Normal heart sounds.  Pulmonary:  Effort: Pulmonary effort is normal. No respiratory distress.     Breath sounds: No wheezing.  Abdominal:     General: Bowel sounds are normal. There is no distension.     Palpations: Abdomen is soft.  Musculoskeletal:        General: No deformity. Normal range of motion.     Cervical back: Normal range of motion and neck supple.  Skin:    General: Skin is warm and dry.     Findings: No erythema or rash.  Neurological:     Mental Status: He is alert and oriented to person, place, and time. Mental status is at baseline.     Cranial Nerves: No cranial nerve deficit.     Coordination: Coordination normal.  Psychiatric:     Comments: Anxious    CMP Latest Ref Rng & Units 02/16/2021  Glucose 70 - 99 mg/dL 116(H)  BUN 6 - 20 mg/dL 10  Creatinine 0.61 - 1.24 mg/dL 0.94  Sodium 135 - 145 mmol/L 136  Potassium 3.5 - 5.1 mmol/L 4.4  Chloride 98 - 111 mmol/L 100  CO2 22 - 32 mmol/L 28  Calcium 8.9 - 10.3 mg/dL 9.4  Total Protein 6.5 - 8.1 g/dL 7.2  Total Bilirubin 0.3 - 1.2 mg/dL 0.3  Alkaline Phos 38 - 126 U/L 62  AST 15 - 41 U/L 27  ALT 0 - 44 U/L 34   CBC Latest Ref Rng & Units 02/26/2021  WBC 4.0 - 10.5 K/uL 13.5(H)  Hemoglobin 13.0 - 17.0 g/dL 16.5  Hematocrit 39.0 - 52.0 % 44.6  Platelets 150 - 400 K/uL 258    RADIOGRAPHIC STUDIES: I have  personally reviewed the radiological images as listed and agreed with the findings in the report. CT BONE MARROW BIOPSY & ASPIRATION  Result Date: 02/26/2021 INDICATION: Persistent monocytosis. EXAM: CT GUIDED BONE MARROW ASPIRATION AND CORE BIOPSY MEDICATIONS: None. ANESTHESIA/SEDATION: Moderate (conscious) sedation was employed during this procedure. A total of 3 milligrams versed and 100 micrograms fentanyl were administered intravenously. The patient's level of consciousness and vital signs were monitored continuously by radiology nursing throughout the procedure under my direct supervision. Total monitored sedation time: 18 minutes FLUOROSCOPY TIME:  CT dose was not reported. COMPLICATIONS: None immediate. Estimated blood loss: <5 mL PROCEDURE: Informed written consent was obtained from the patient after a thorough discussion of the procedural risks, benefits and alternatives. All questions were addressed. Maximal Sterile Barrier Technique was utilized including caps, mask, sterile gowns, sterile gloves, sterile drape, hand hygiene and skin antiseptic. A timeout was performed prior to the initiation of the procedure. The patient was positioned prone and non-contrast localization CT was performed of the pelvis to demonstrate the iliac marrow spaces. Maximal barrier sterile technique utilized including caps, mask, sterile gowns, sterile gloves, large sterile drape, hand hygiene, and chlorhexidine prep. Under sterile conditions and local anesthesia, an 11 gauge coaxial bone biopsy needle was advanced into the RIGHT iliac marrow space. Needle position was confirmed with CT imaging. Initially, bone marrow aspiration was performed. Next, the 11 gauge outer cannula was utilized to obtain a RIGHT iliac bone marrow core biopsy. Needle was removed. Hemostasis was obtained with compression. The patient tolerated the procedure well. Samples were prepared with the cytotechnologist. IMPRESSION: Successful CT-guided bone  marrow aspiration and biopsy, as above. Michaelle Birks, MD [facility] Electronically Signed   By: Michaelle Birks M.D.   On: 02/26/2021 13:38     Assessment and plan  1. Monocytosis  2. Family history of cancer   3. Tobacco use   4. Alcohol use   5. Other iron deficiency anemia    #Chronic leukocytosis, predominantly neutrophilia.  Chronic monocytosis, basophilia.  This is likely due to smoking.  Patient has had multiple complaints including bone pain, fluctuating weight, history of night sweats. Chronic arthralgia.  ANA, CRP, ESR, all negative.  Lyme's disease are negative. Abdominal ultrasound did not reveal splenomegaly.  BCR ABL, JAK2, MPL, CAL R are negative.  Bone marrow biopsy was obtained for further evaluation, rule out CMML. 02/26/2021, bone marrow biopsy shows normocellular marrow with trilineage hematopoiesis.  No leukemia or lymphoma identified. Storage iron was present.  Cytogenetics were normal. Chronic monocytosis is likely reactive due to smoking. Discussed with patient and recommend smoke cessation.  Patient also has chronic alcohol use,.  Discussed about alcohol cessation  Iron deficiency without anemia.  Recommend patient to take oral iron supplementation. Family history of cancer, I have referred patient to genetic counselor.   #Chronic alcohol use, alcohol cessation was discussed.  I recommend patient to continue follow-up with primary care provider.  No need to follow-up with hematology.  He is welcome to reestablish care in the future if he develops new symptoms or concerns    Earlie Server, MD, PhD 03/15/2021

## 2021-03-16 ENCOUNTER — Telehealth: Payer: Self-pay

## 2021-03-16 NOTE — Telephone Encounter (Signed)
-----   Message from Rickard Patience, MD sent at 03/15/2021  8:12 PM EDT ----- Please let him know that his iron level is slight low, blood count is normal. I recommend him to take vitron c once daily for 3 months. Rx was sent pharmacy.

## 2021-03-16 NOTE — Telephone Encounter (Signed)
My chart message sent to pt.

## 2021-04-13 ENCOUNTER — Ambulatory Visit: Payer: Self-pay | Admitting: *Deleted

## 2021-04-13 NOTE — Telephone Encounter (Signed)
Reason for Disposition  [1] MODERATE diarrhea (e.g., 4-6 times / day more than normal) AND [2] present > 48 hours (2 days)  Answer Assessment - Initial Assessment Questions 1. DIARRHEA SEVERITY: "How bad is the diarrhea?" "How many more stools have you had in the past 24 hours than normal?"    - NO DIARRHEA (SCALE 0)   - MILD (SCALE 1-3): Few loose or mushy BMs; increase of 1-3 stools over normal daily number of stools; mild increase in ostomy output.   -  MODERATE (SCALE 4-7): Increase of 4-6 stools daily over normal; moderate increase in ostomy output. * SEVERE (SCALE 8-10; OR 'WORST POSSIBLE'): Increase of 7 or more stools daily over normal; moderate increase in ostomy output; incontinence.     VAries.  Moderate to severe 2. ONSET: "When did the diarrhea begin?"      About 3 weeks ago 3. BM CONSISTENCY: "How loose or watery is the diarrhea?"      watery 4. VOMITING: "Are you also vomiting?" If Yes, ask: "How many times in the past 24 hours?"      No 5. ABDOMINAL PAIN: "Are you having any abdominal pain?" If Yes, ask: "What does it feel like?" (e.g., crampy, dull, intermittent, constant)      Mild at times 6. ABDOMINAL PAIN SEVERITY: If present, ask: "How bad is the pain?"  (e.g., Scale 1-10; mild, moderate, or severe)   - MILD (1-3): doesn't interfere with normal activities, abdomen soft and not tender to touch    - MODERATE (4-7): interferes with normal activities or awakens from sleep, abdomen tender to touch    - SEVERE (8-10): excruciating pain, doubled over, unable to do any normal activities       NA 7. ORAL INTAKE: If vomiting, "Have you been able to drink liquids?" "How much liquids have you had in the past 24 hours?"     Yes 8. HYDRATION: "Any signs of dehydration?" (e.g., dry mouth [not just dry lips], too weak to stand, dizziness, new weight loss) "When did you last urinate?"     None 9. EXPOSURE: "Have you traveled to a foreign country recently?" "Have you been exposed to  anyone with diarrhea?" "Could you have eaten any food that was spoiled?"     Yes, spoiled food 10. ANTIBIOTIC USE: "Are you taking antibiotics now or have you taken antibiotics in the past 2 months?"       no 11. OTHER SYMPTOMS: "Do you have any other symptoms?" (e.g., fever, blood in stool)       Nausea  Protocols used: Diarrhea-A-AH
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
## 2021-04-14 ENCOUNTER — Telehealth (INDEPENDENT_AMBULATORY_CARE_PROVIDER_SITE_OTHER): Payer: Medicare Other | Admitting: Family Medicine

## 2021-04-14 ENCOUNTER — Encounter: Payer: Self-pay | Admitting: Family Medicine

## 2021-04-14 DIAGNOSIS — R197 Diarrhea, unspecified: Secondary | ICD-10-CM | POA: Diagnosis not present

## 2021-04-14 MED ORDER — LOPERAMIDE HCL 2 MG PO TABS: 4.0000 mg | ORAL_TABLET | Freq: Four times a day (QID) | ORAL | 1 refills | Status: DC | PRN
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
## 2021-04-14 NOTE — Patient Instructions (Signed)
It was great to see you!  Our plans for today:  - Come by the lab to get a stool collection kit. - Take the imodium as prescribed. - Make sure to stay well hydrated and wash your hands often.  We are checking some labs today, we will release these results to your MyChart.  Take care and seek immediate care sooner if you develop any concerns.   Dr. Ky Barban

## 2021-04-19 IMAGING — CR LEFT KNEE - 1-2 VIEW
1 series · 3 of 3 positions shown · non-contrast
Comparison: None.

CLINICAL DATA: Chronic LEFT knee pain.  Initial encounter.

EXAM:
LEFT KNEE - 1-2 VIEW

[Series 1: dg knee 1-2 views left · 0.14mm/px · 3 of 3 slices shown]
[im 1/3]
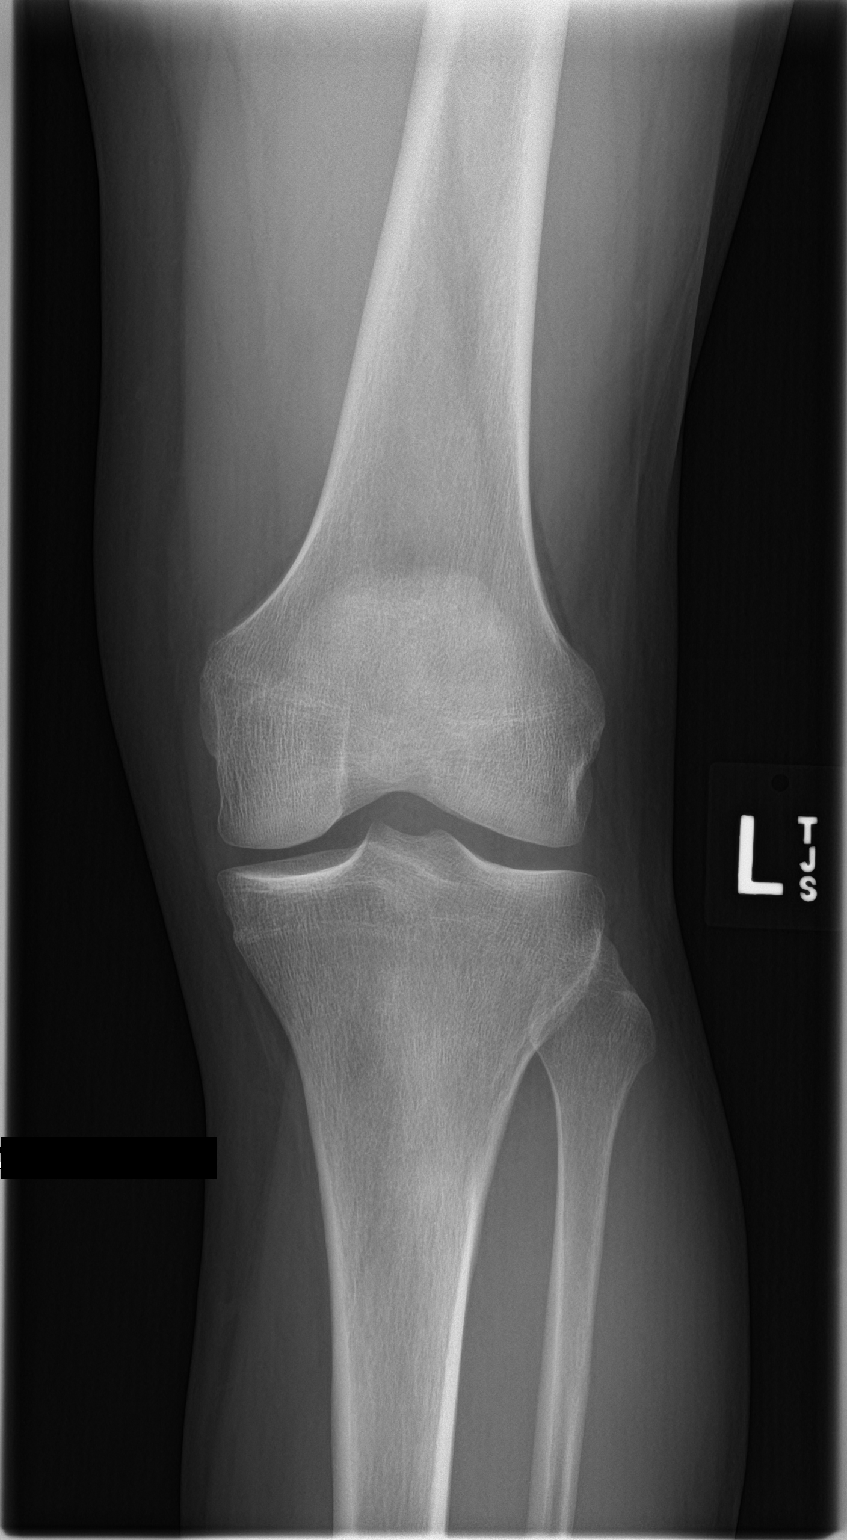
[im 2/3]
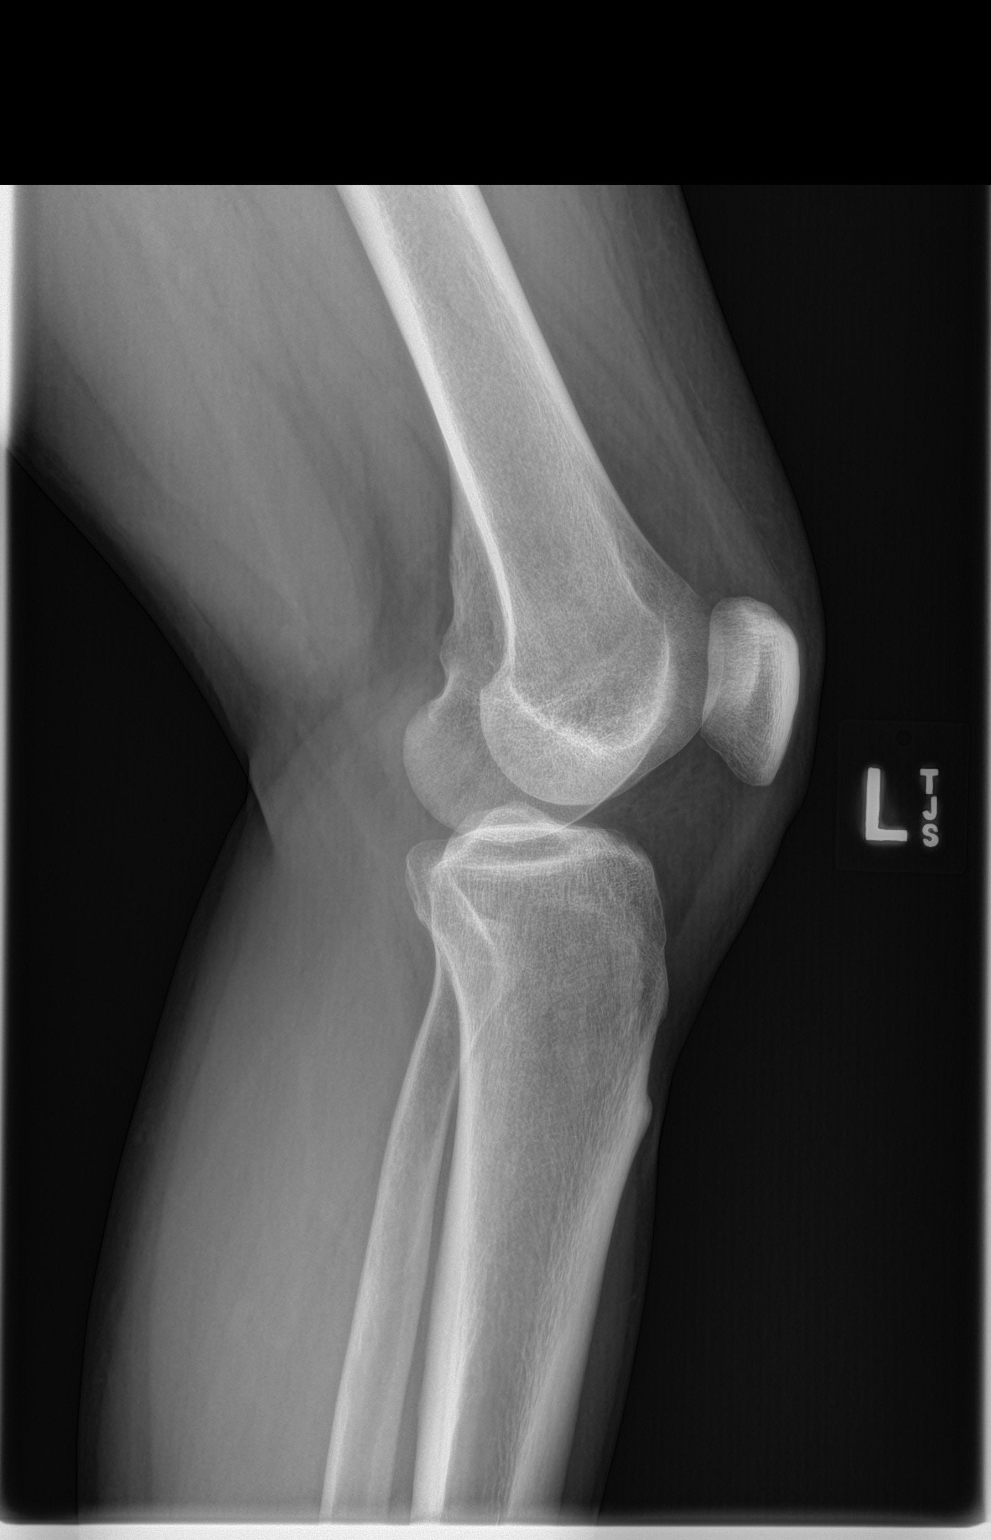
[im 3/3]
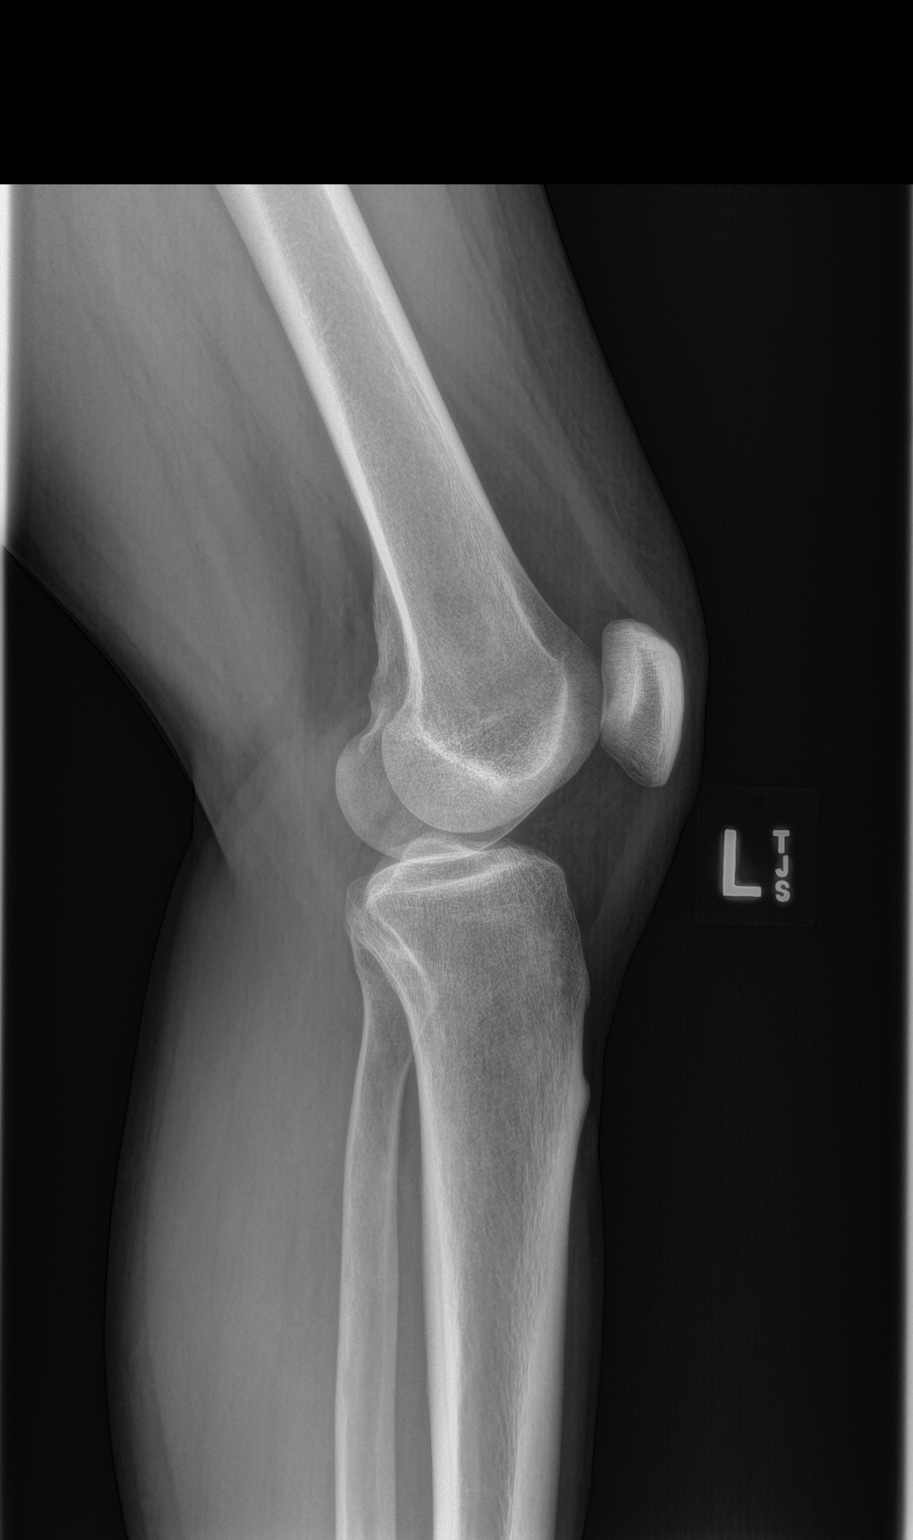

[3 of 3 positions shown; findings below may reference images not displayed]

FINDINGS: No evidence of fracture, dislocation, or joint effusion. No evidence
of arthropathy or other focal bone abnormality. Soft tissues are
unremarkable.
IMPRESSION: Negative.

## 2021-04-19 IMAGING — CR DG HIP (WITH OR WITHOUT PELVIS) 2-3V LEFT
1 series · 3 of 3 positions shown · non-contrast
Comparison: None.

CLINICAL DATA: LEFT hip pain.  Initial encounter.

EXAM:
DG HIP (WITH OR WITHOUT PELVIS) 2-3V LEFT

[Series 1: dg hip unilat w or w/o pelvis 2-3 views  · non-contrast · 0.14mm/px · 3 of 3 slices shown]
[im 1/3]
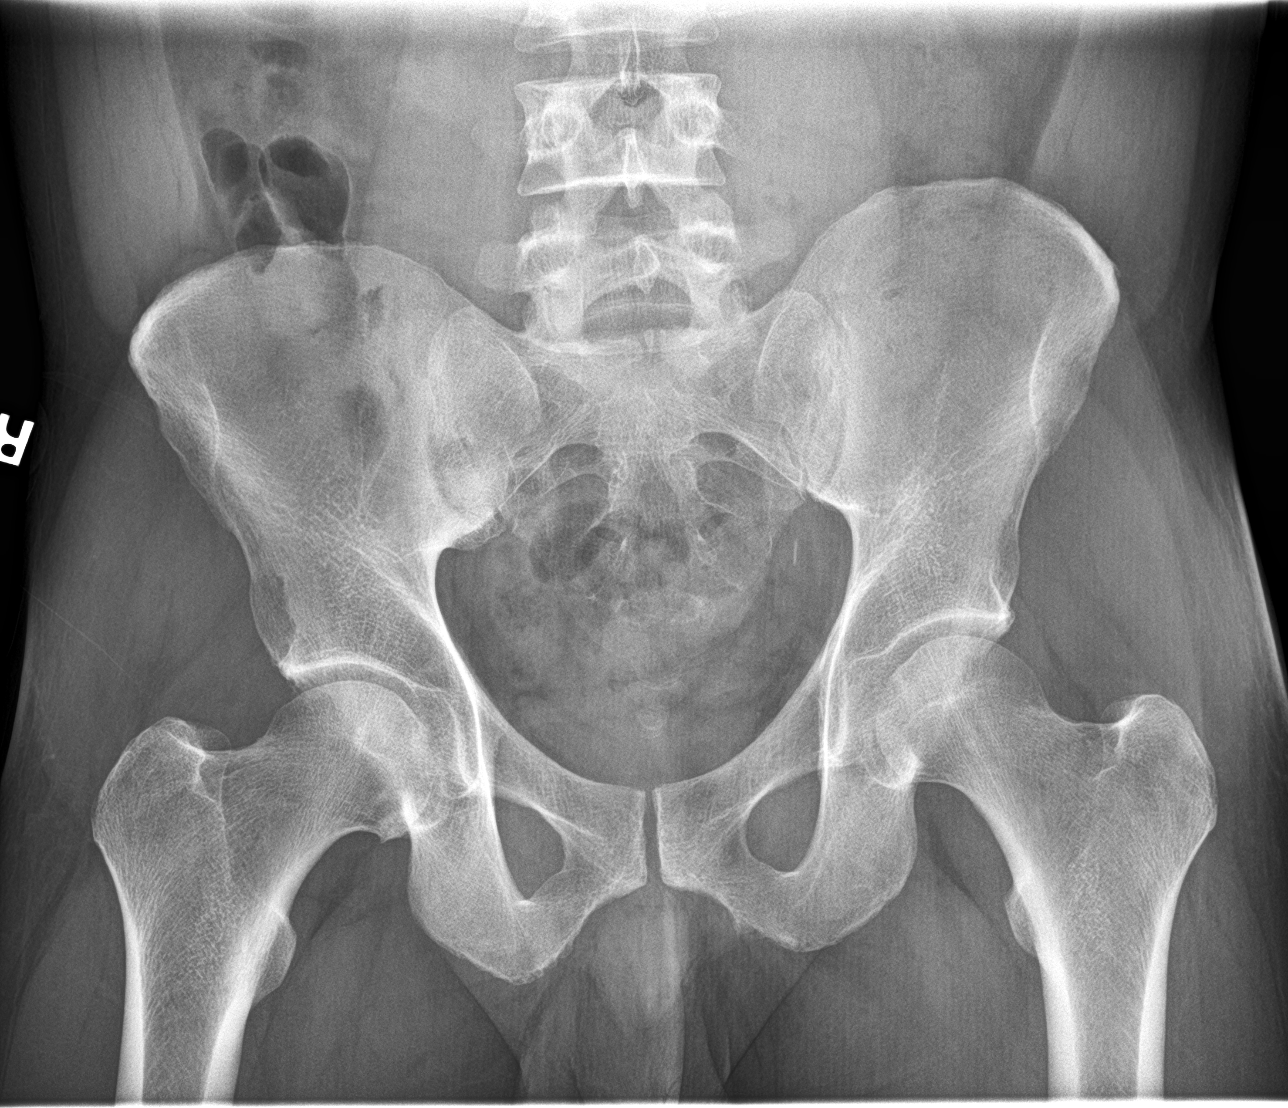
[im 2/3]
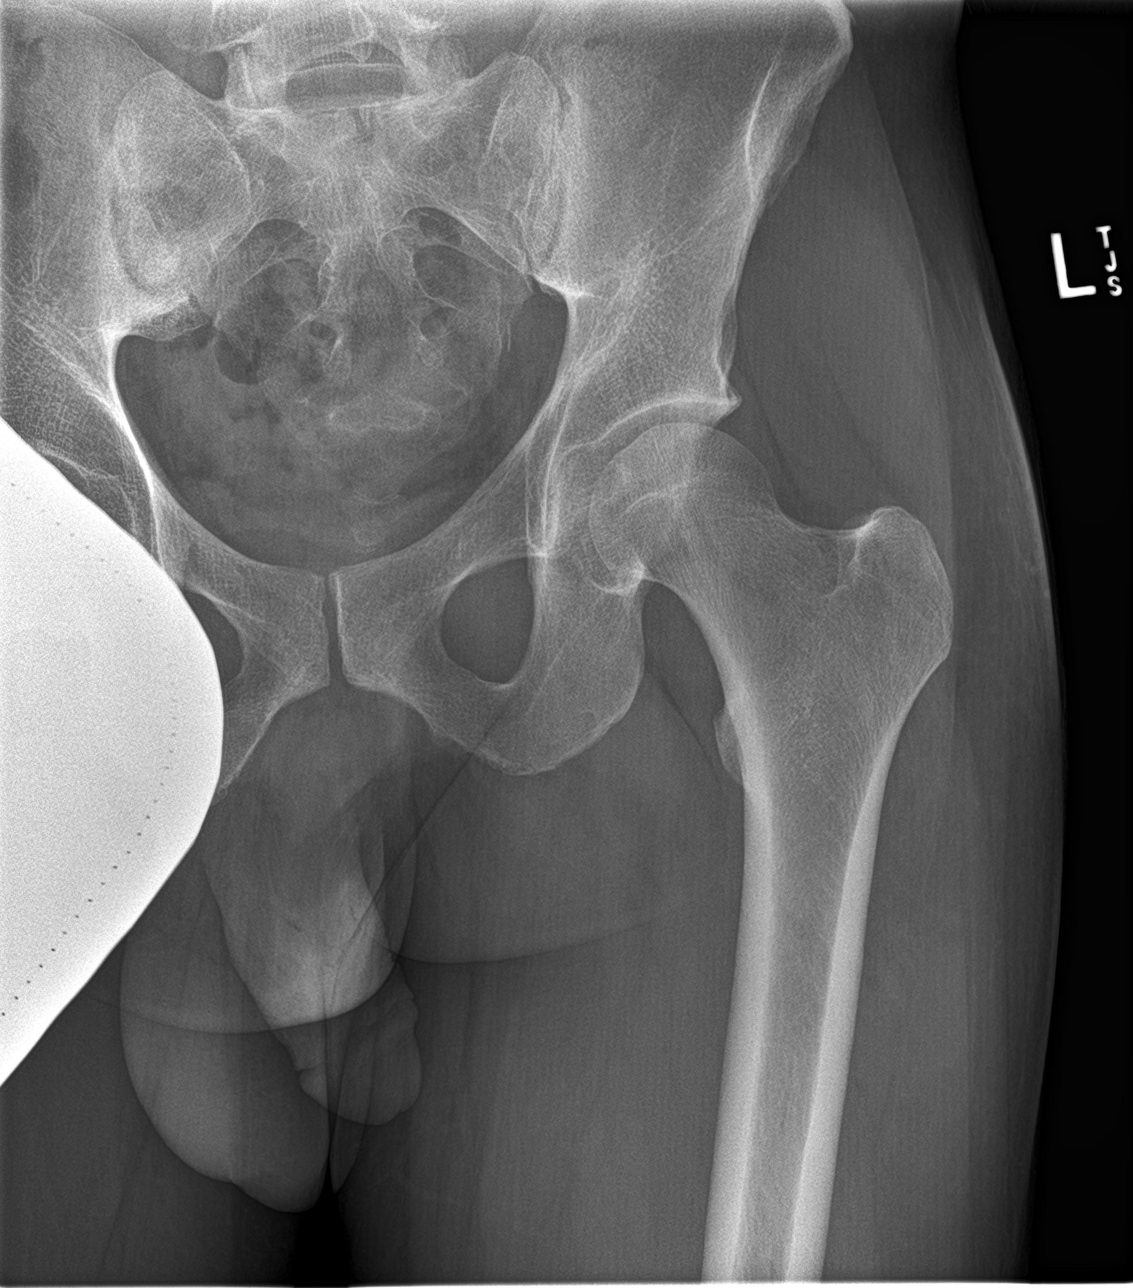
[im 3/3]
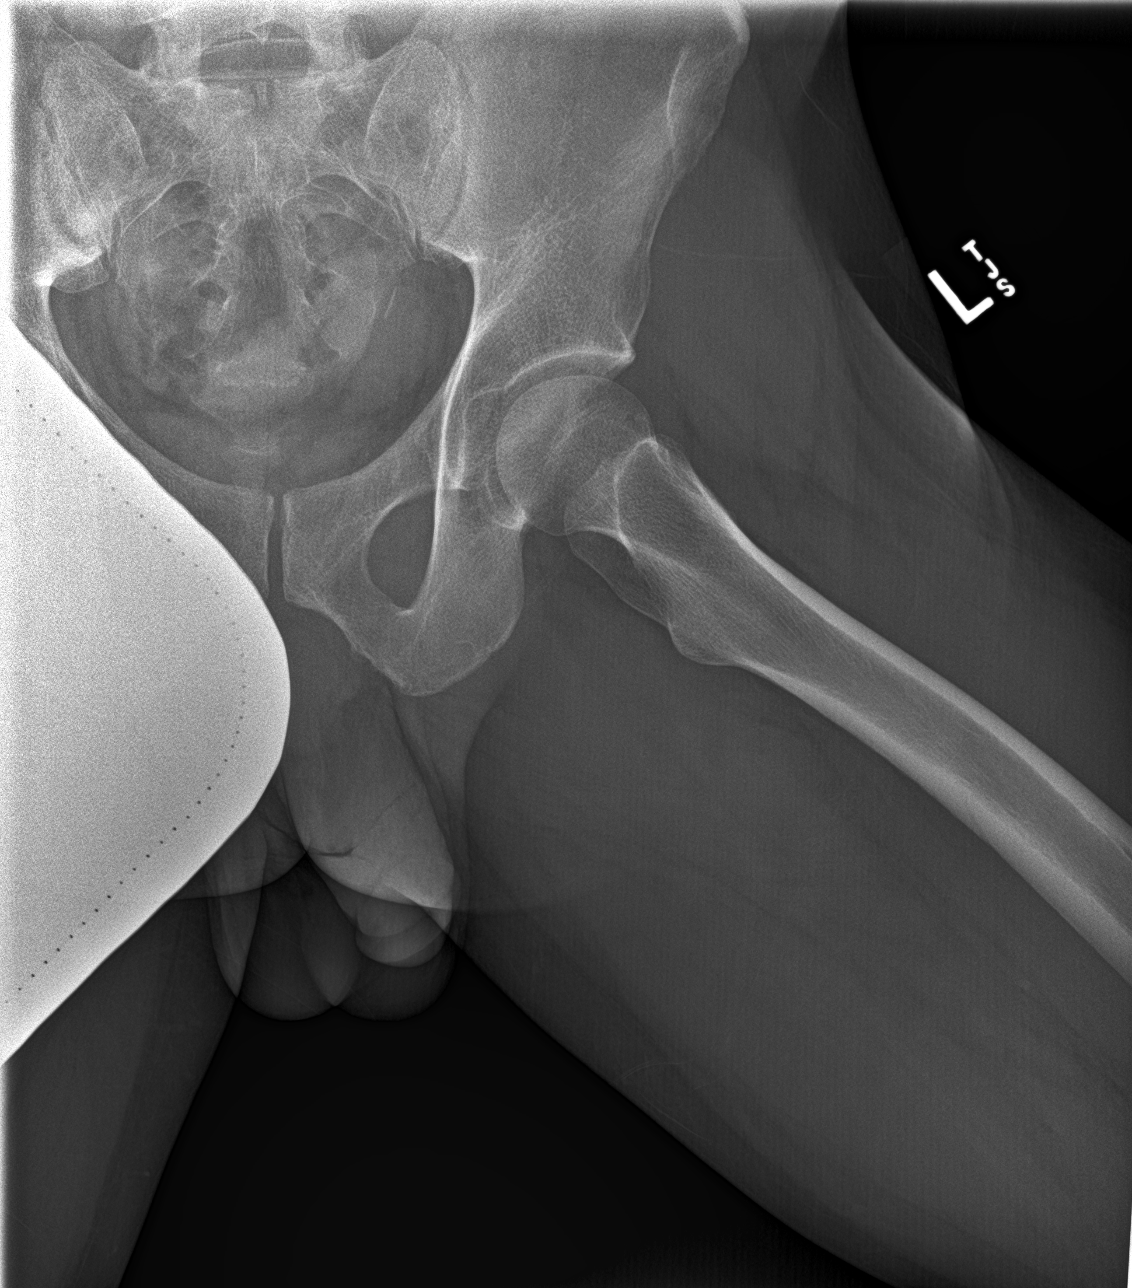

[3 of 3 positions shown; findings below may reference images not displayed]

FINDINGS: There is no evidence of hip fracture or dislocation. There is no
evidence of arthropathy or other focal bone abnormality.
IMPRESSION: Negative.

## 2021-04-19 IMAGING — CR DG HIP (WITH OR WITHOUT PELVIS) 2-3V RIGHT
1 series · 3 of 3 positions shown · non-contrast
Comparison: None.

CLINICAL DATA: RIGHT hip pain.  Initial encounter.

EXAM:
DG HIP (WITH OR WITHOUT PELVIS) 2-3V RIGHT

[Series 1: dg hip unilat w or w/o pelvis 2-3 views  · non-contrast · 0.14mm/px · 3 of 3 slices shown]
[im 1/3]
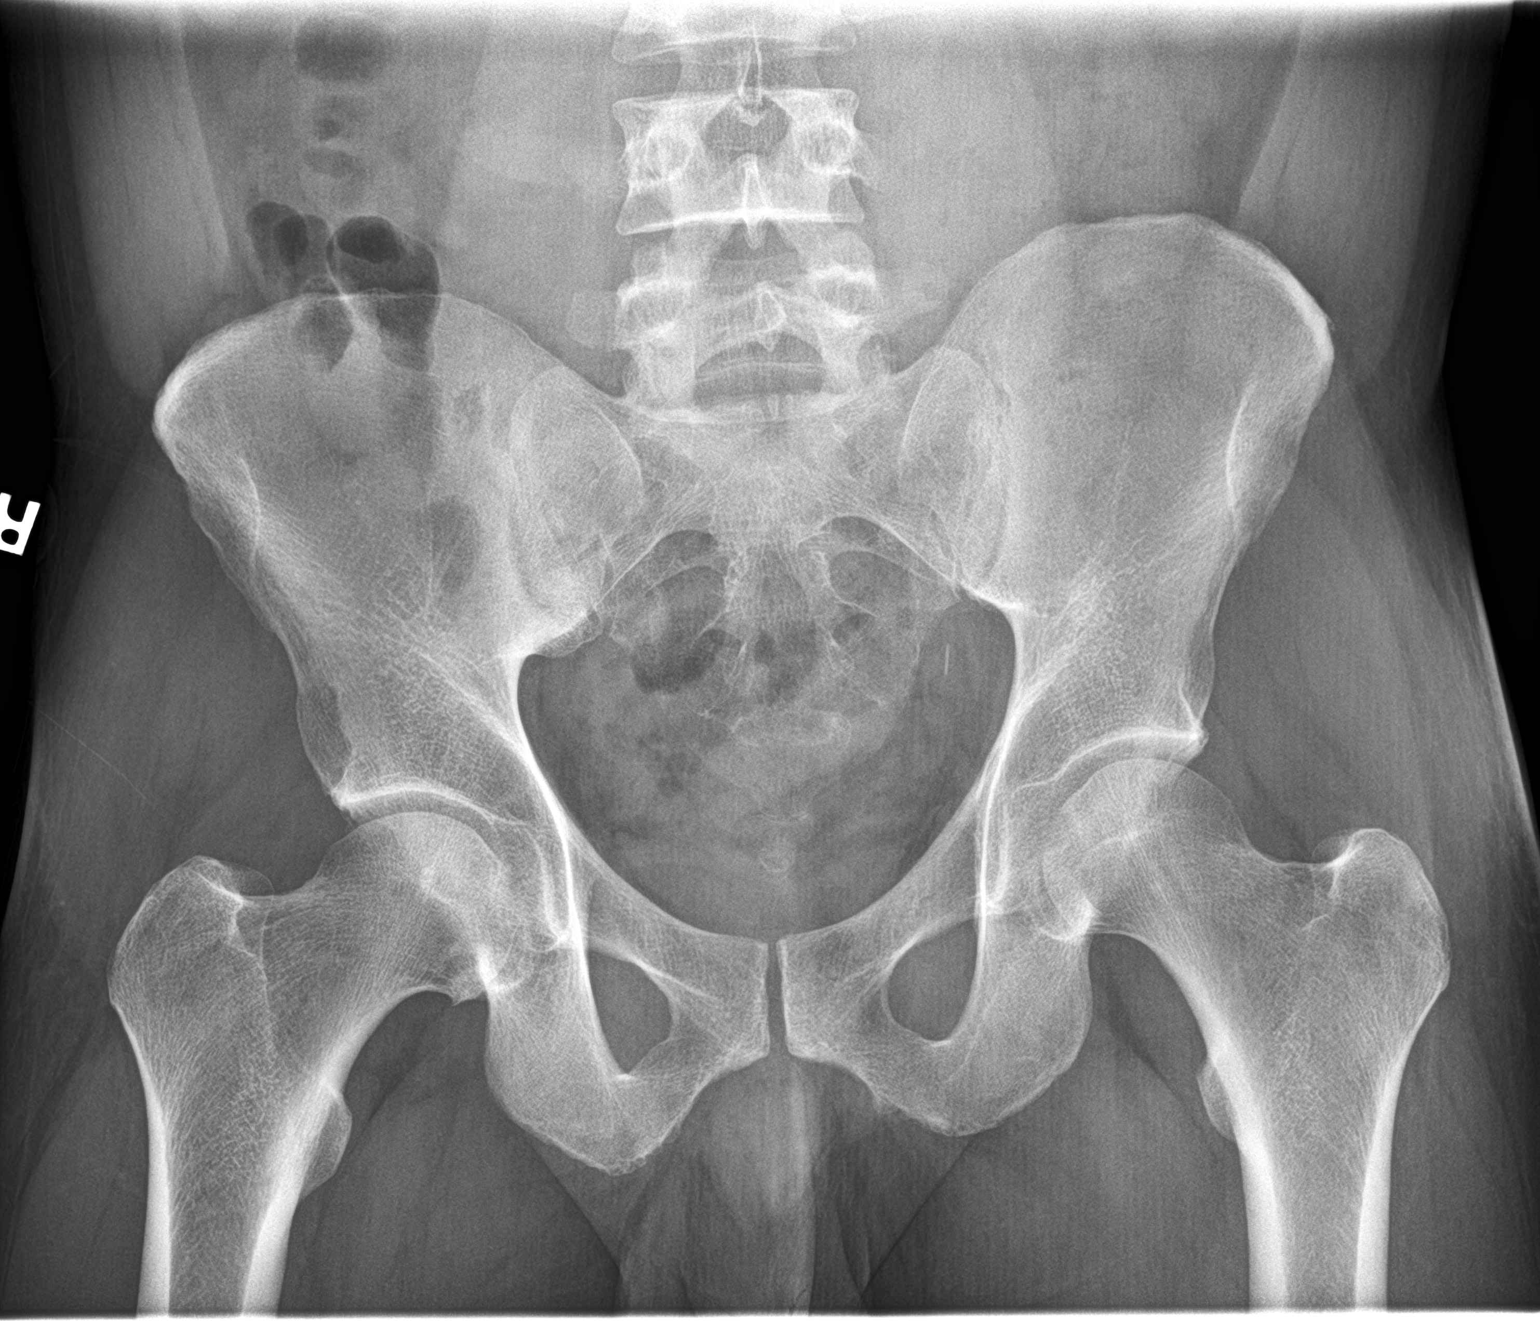
[im 2/3]
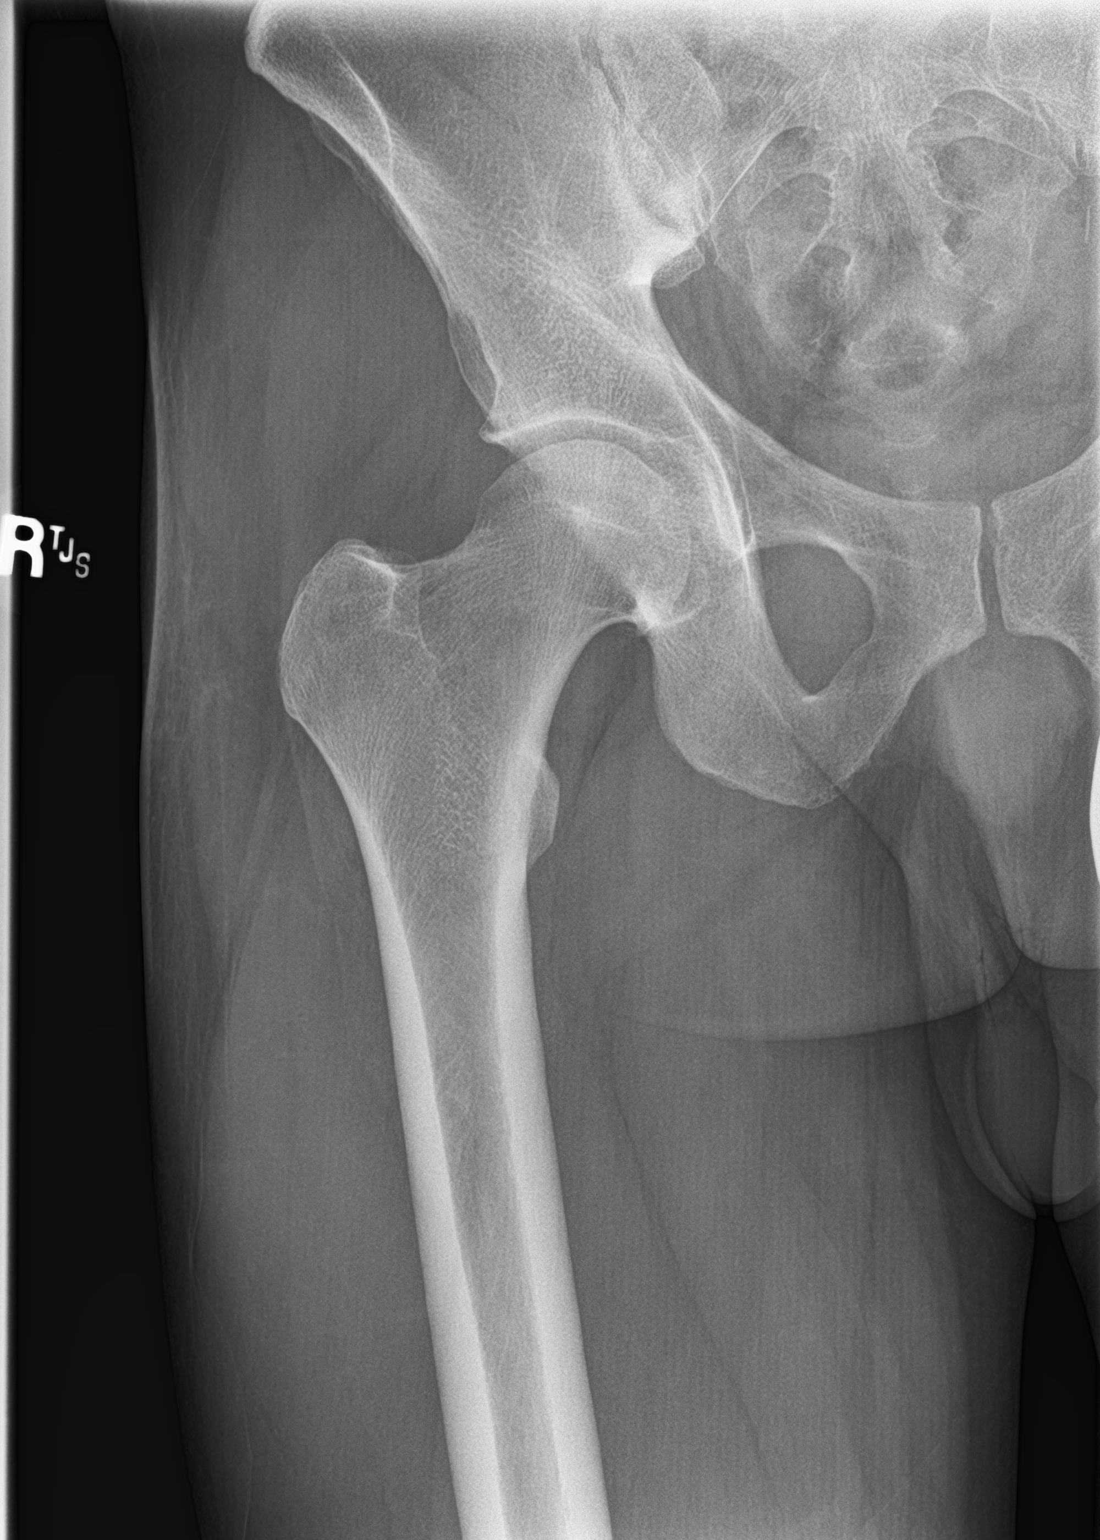
[im 3/3]
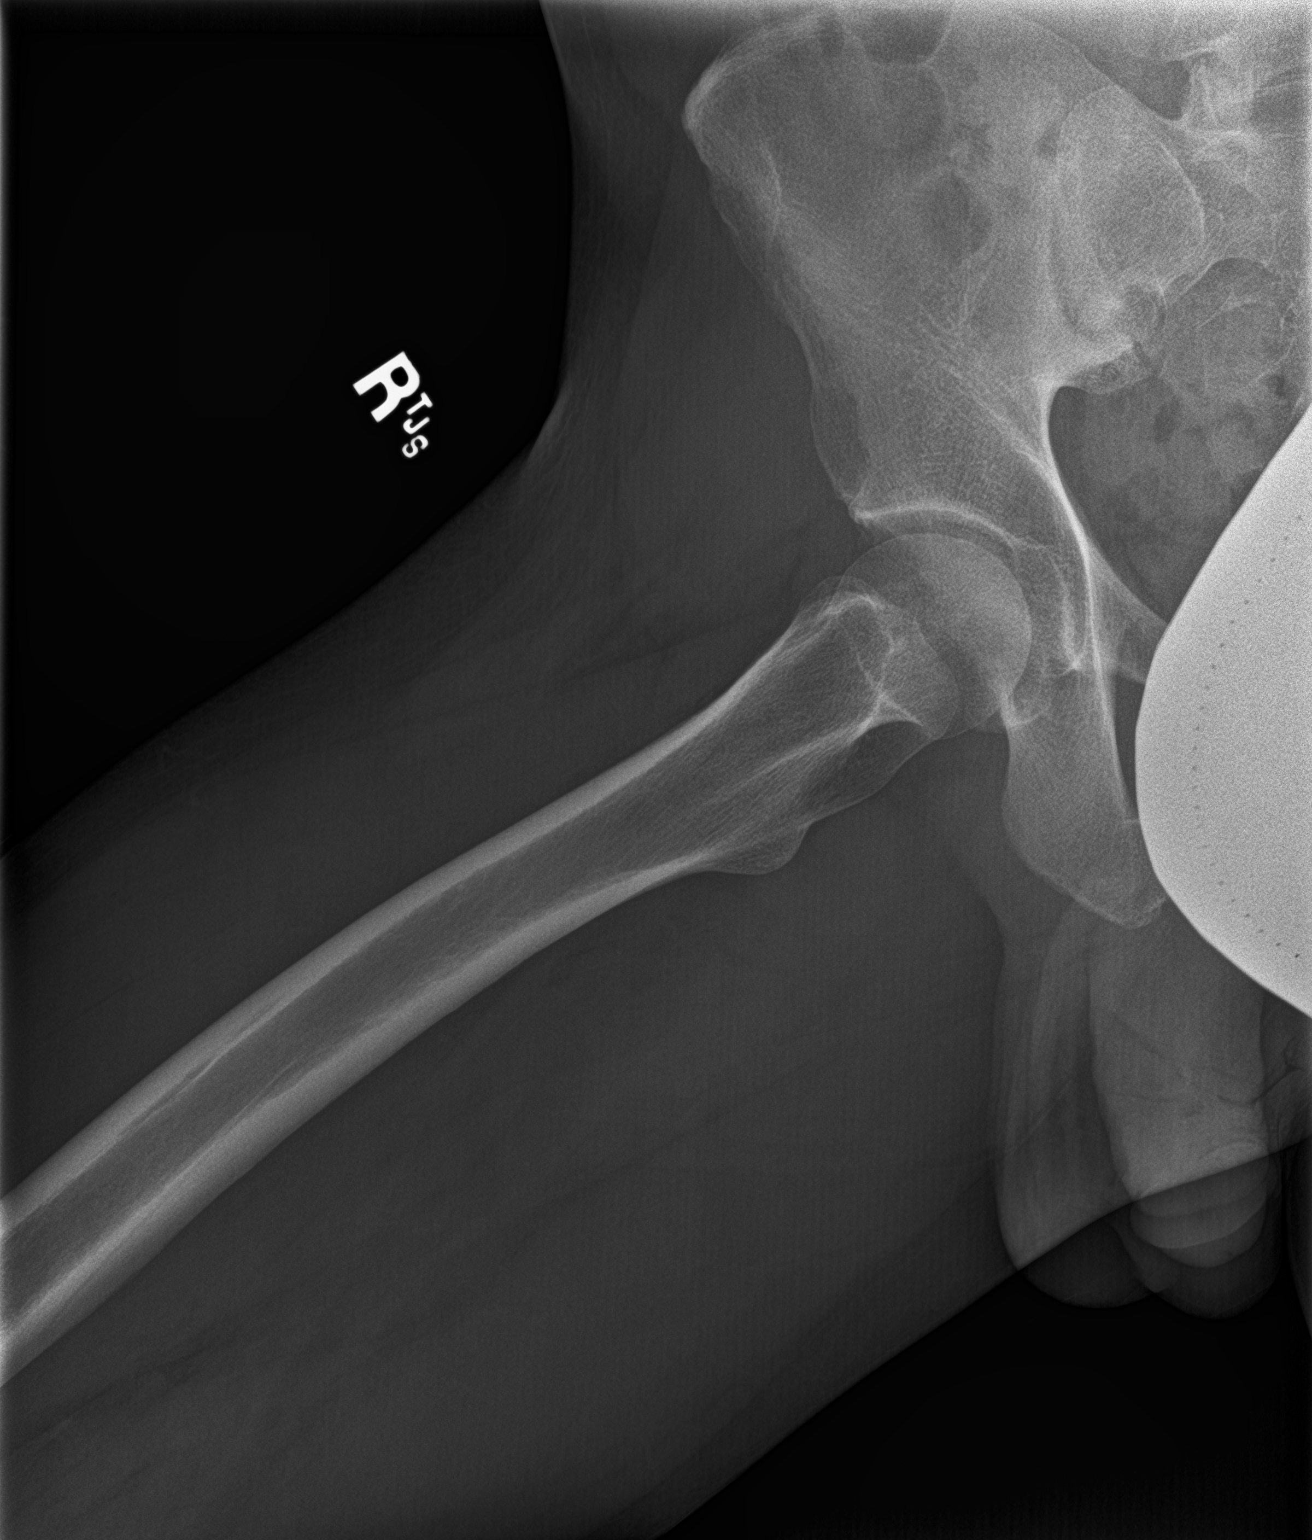

[3 of 3 positions shown; findings below may reference images not displayed]

FINDINGS: There is no evidence of hip fracture or dislocation. There is no
evidence of arthropathy or other focal bone abnormality.
IMPRESSION: Negative.

## 2021-04-19 IMAGING — CR LEFT SHOULDER - 2+ VIEW
1 series · 3 of 3 positions shown · non-contrast
Comparison: None.

CLINICAL DATA: Chronic LEFT shoulder pain.  Initial encounter.

EXAM:
LEFT SHOULDER - 2+ VIEW

[Series 1: dg shoulder left · 0.14mm/px · 3 of 3 slices shown]
[im 1/3]
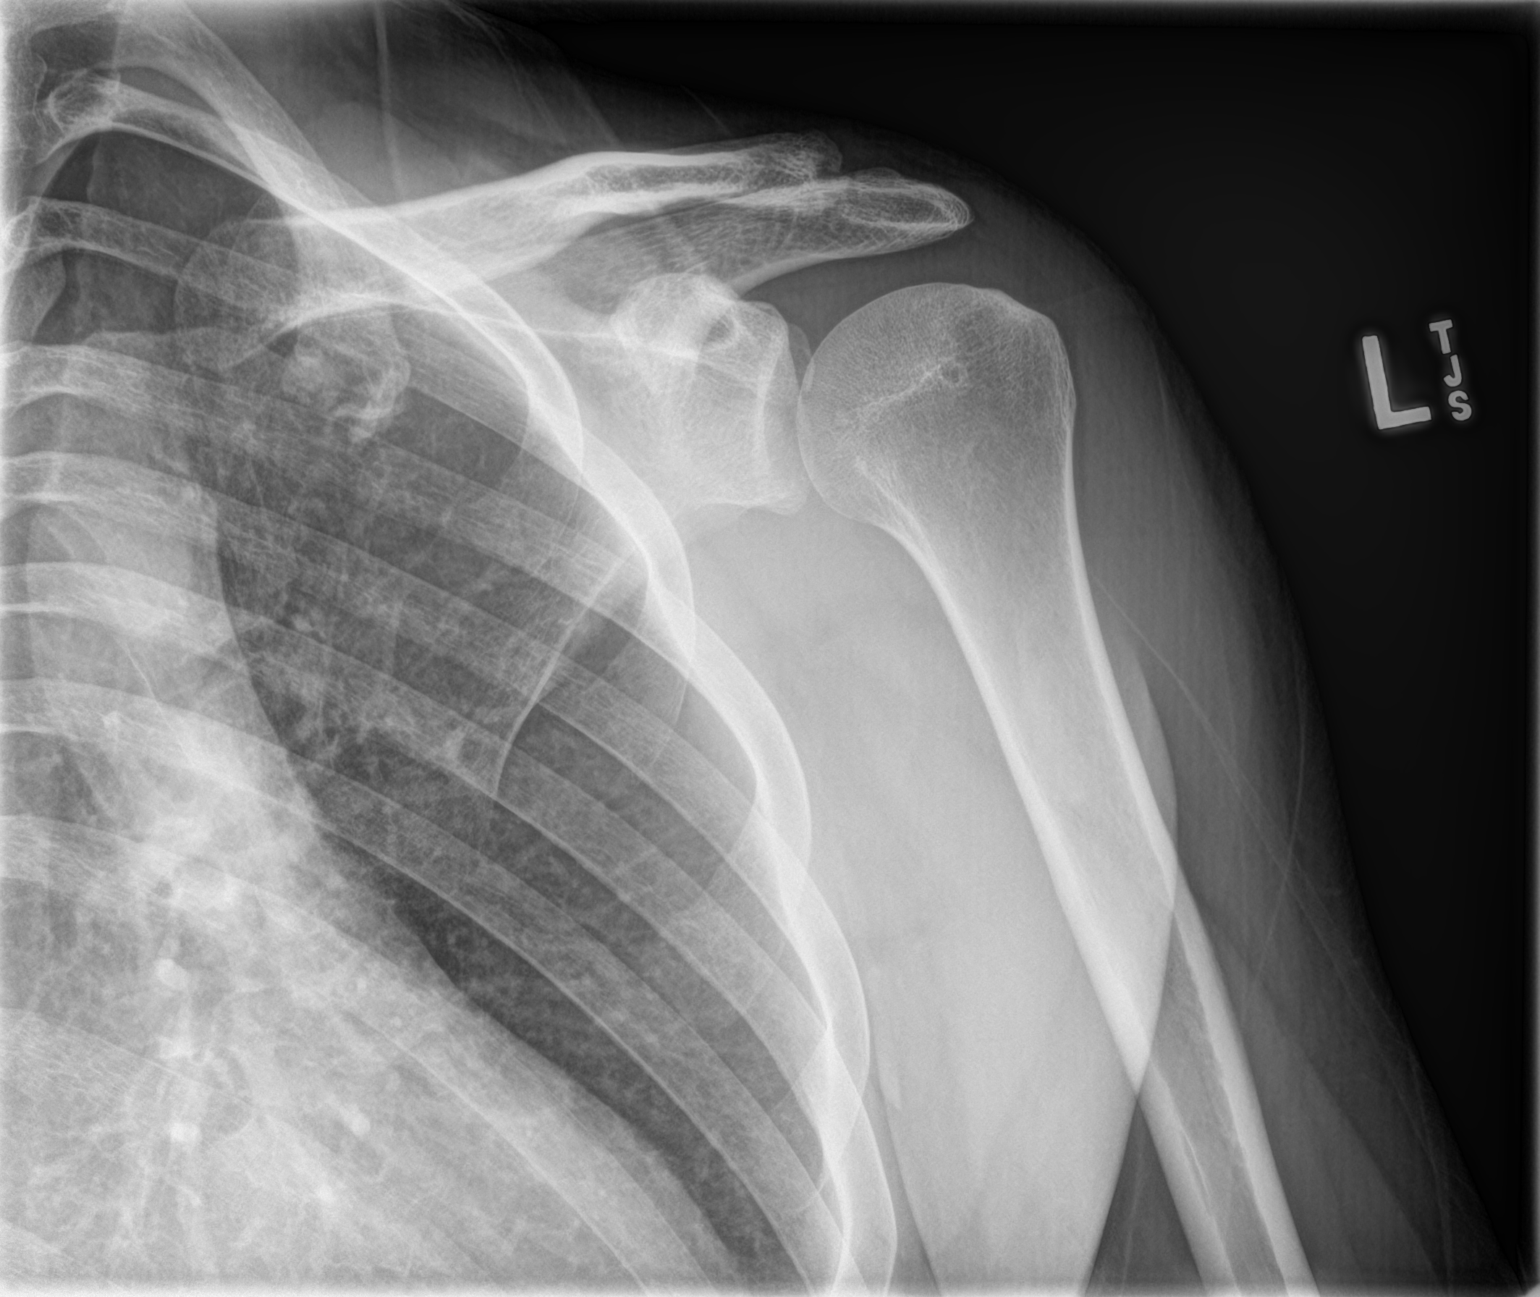
[im 2/3]
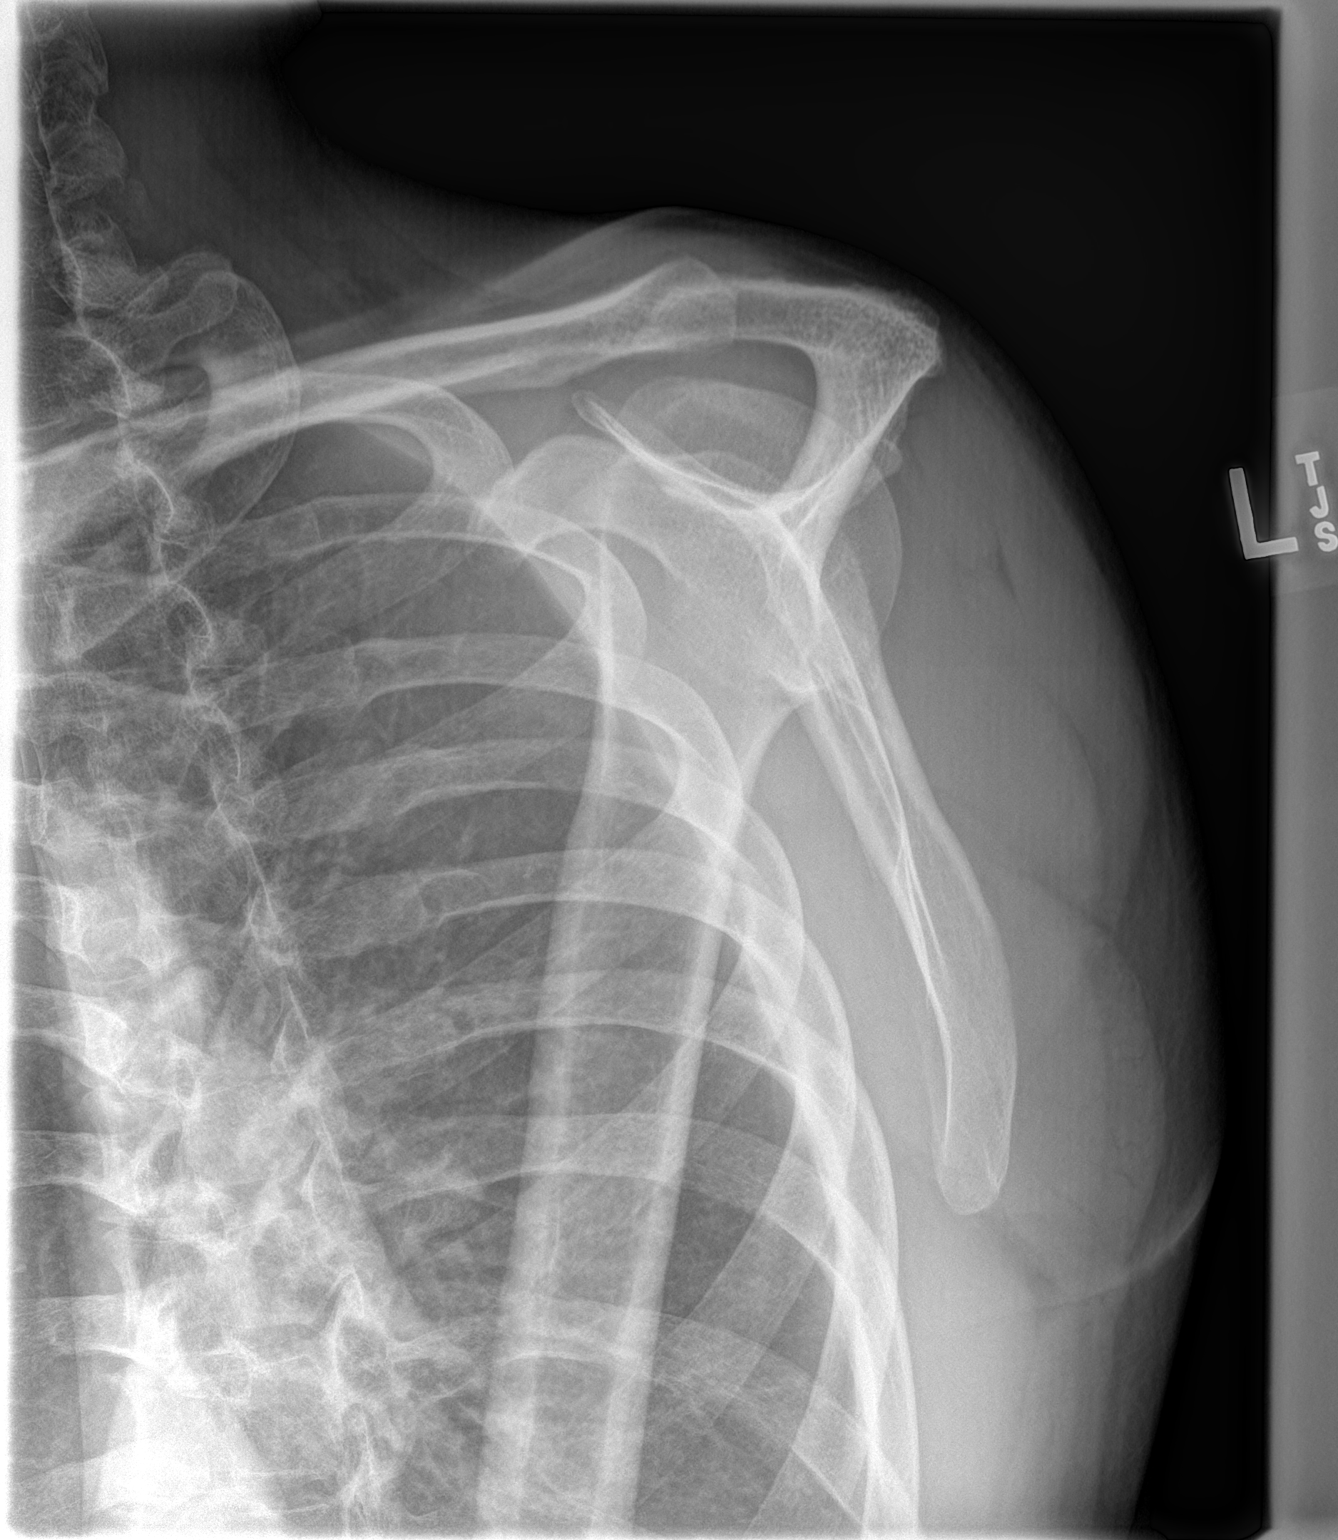
[im 3/3]
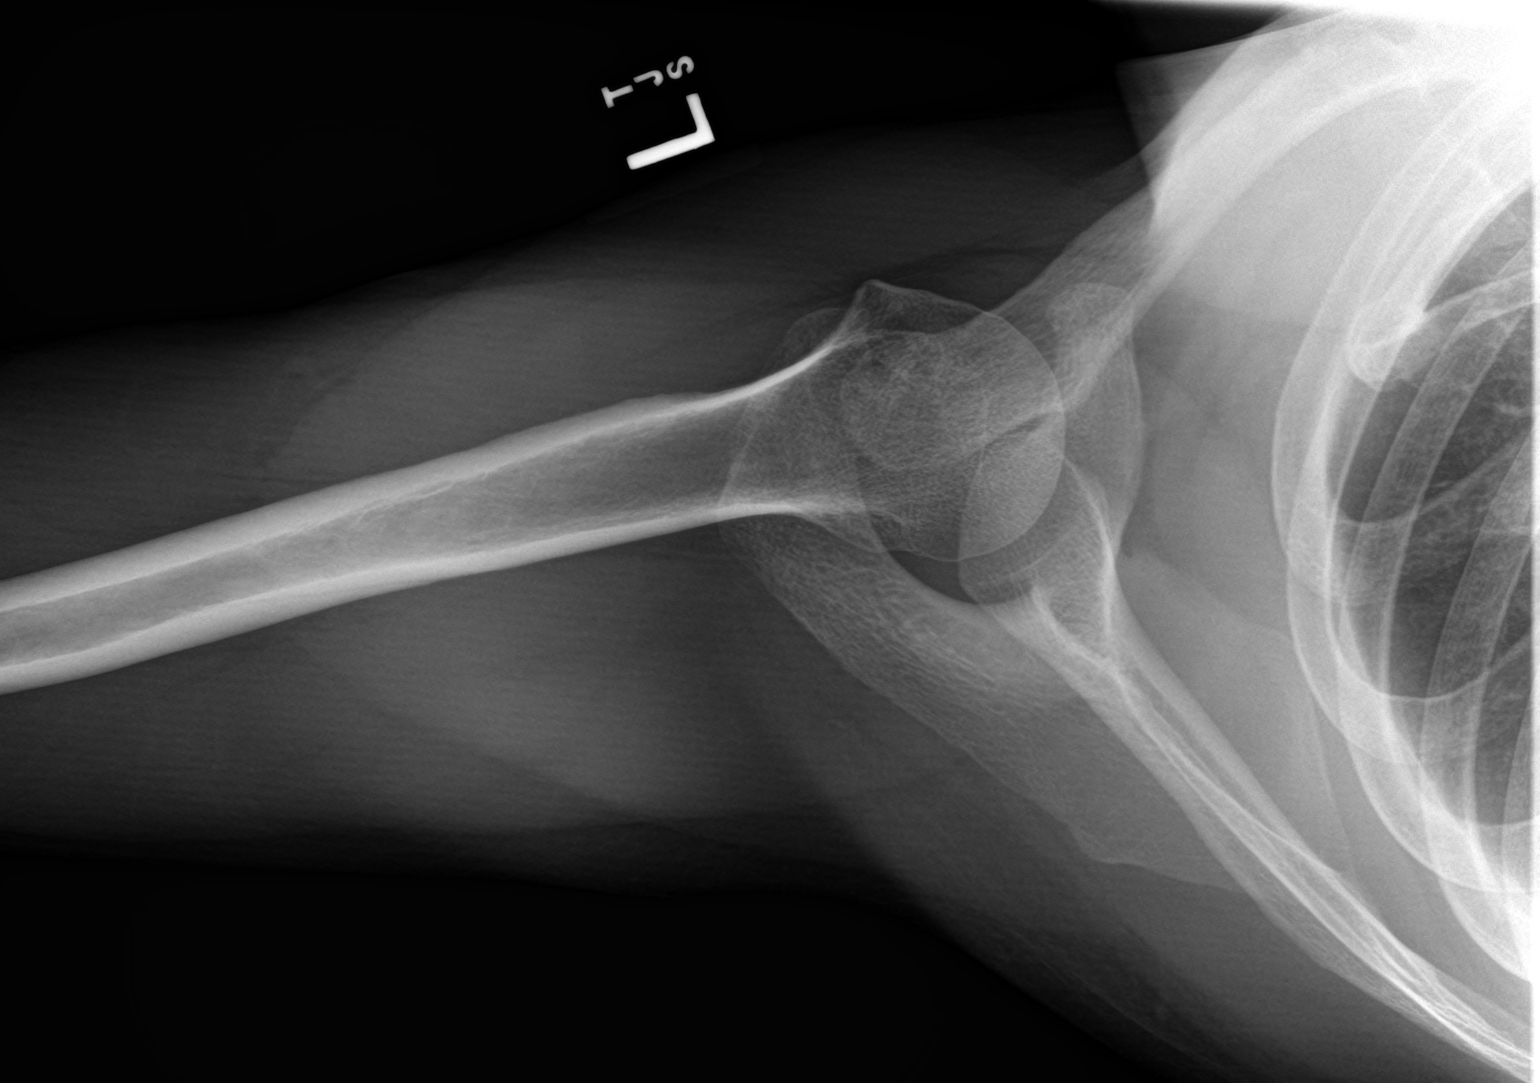

[3 of 3 positions shown; findings below may reference images not displayed]

FINDINGS: There is no evidence of fracture or dislocation. There is no
evidence of arthropathy or other focal bone abnormality. Soft
tissues are unremarkable.
IMPRESSION: Negative.

## 2021-04-27 ENCOUNTER — Telehealth (INDEPENDENT_AMBULATORY_CARE_PROVIDER_SITE_OTHER): Payer: Medicare Other | Admitting: Family Medicine

## 2021-04-27 ENCOUNTER — Encounter: Payer: Self-pay | Admitting: Family Medicine

## 2021-04-27 ENCOUNTER — Other Ambulatory Visit: Payer: Self-pay | Admitting: Family Medicine

## 2021-04-27 DIAGNOSIS — Z809 Family history of malignant neoplasm, unspecified: Secondary | ICD-10-CM

## 2021-04-27 MED ORDER — MOMETASONE FUROATE 50 MCG/ACT NA SUSP: 2.0000 | Freq: Every day | NASAL | 0 refills | Status: DC

## 2021-04-27 NOTE — Patient Instructions (Signed)
You have a viral respiratory illness and it should start to get better about 7 - 10 days after it started.    Take the nasonex as needed for 10 days.  For your congestion, try and over the counter decongestant like dayquil, robatussin, theraflu.  For your nasal congestion and runny nose, try using Afrin (generic is Oxymetazoline) twice daily for 3 days.  Do not use for longer that 3 days.    Some other therapies you can try are: push fluids, rest, use vaporizer or mist as needed, encouraged very strongly to quit smoking, and return office visit as needed if symptoms persist or worsen.   Drinking warm liquids such as teas and soups can help with secretions and cough. A mist humidifier or vaporizer can work well to help with secretions and cough.  It is very important to clean the humidifier between use according to the instructions.    It was good to see you.  If you're still having trouble in the next week, come back and see [place].    Of course, if you start having trouble breathing, worsening fevers, vomiting and unable to hold down any fluids, or you have other concerns, don't hesitate to come back or go to the ED after hours.

## 2021-04-27 NOTE — Progress Notes (Signed)
Virtual Visit via Video Note  I connected with David Phillips on 04/27/21 at  9:40 AM EST by a video enabled telemedicine application and verified that I am speaking with the correct person using two identifiers.  Location: Patient: home Provider: [facility]   I discussed the limitations of evaluation and management by telemedicine and the availability of in person appointments. The patient expressed understanding and agreed to proceed.  History of Present Illness:  UPPER RESPIRATORY TRACT INFECTION - few days duration, 3-4 days - has not tested for COVID  Fever: no Cough: yes, productive of mucus Shortness of breath: no Chest pain: no Chest tightness: no Chest congestion: yes Nasal congestion: yes Runny nose: yes Headache: no Face pain: no Toothache: no Ear pain: no  Ear pressure: no  Vomiting: no Sick contacts: no Context: stable Recurrent sinusitis: no Relief with OTC cold/cough medications: hasn't tried Treatments attempted: elderberry, zinc    Observations/Objective:  Well appearing, in NAD. Speaks in full sentences, no respiratory distress. Occasional coughing.  Assessment and Plan:  VIRAL URI Doing well with mild sx. Recommended COVID testing, however patient declines.  Reviewed OTC symptom relief, self-quarantine guidelines, and emergency precautions. Rx nasonex for additional symptom relief per patient preference. Tobacco cessation recommended.    I discussed the assessment and treatment plan with the patient. The patient was provided an opportunity to ask questions and all were answered. The patient agreed with the plan and demonstrated an understanding of the instructions.   The patient was advised to call back or seek an in-person evaluation if the symptoms worsen or if the condition fails to improve as anticipated.  I provided 11 minutes of non-face-to-face time during this encounter.   Caro Laroche, DO

## 2021-04-27 NOTE — Telephone Encounter (Signed)
Requested medication (s) are due for refill today - no  Requested medication (s) are on the active medication list -yes  Future visit scheduled - yes  Last refill: 04/27/21  Notes to clinic: Pharmacy request 90 day supply- requires changes from Rx written today  Requested Prescriptions  Pending Prescriptions Disp Refills   mometasone (NASONEX) 50 MCG/ACT nasal spray [Pharmacy Med Name: MOMETASONE NASAL SPRAY (120)] 51 g     Sig: SHAKE LIQUID AND USE 2 SPRAYS IN EACH NOSTRIL DAILY FOR 10 DAYS     Ear, Nose, and Throat: Nasal Preparations - Corticosteroids Passed - 04/27/2021  9:17 AM      Passed - Valid encounter within last 12 months    Recent Outpatient Visits           Today Family history of cancer   [facility] [place] Dense M, DO   1 week ago Diarrhea of presumed infectious origin   [facility], Darl Householder, DO   1 month ago Mixed hyperlipidemia   [facility] [name], PA-C   7 months ago Chronic pain of both knees   [facility] [place], [place], [place]   9 months ago Primary hypertension   [facility] [name], [place]       Future Appointments             In 4 months [name], PA-C [facility], [facility]               Requested Prescriptions  Pending Prescriptions Disp Refills   mometasone (NASONEX) 50 MCG/ACT nasal spray [Pharmacy Med Name: MOMETASONE NASAL SPRAY (120)] 51 g     Sig: SHAKE LIQUID AND USE 2 SPRAYS IN EACH NOSTRIL DAILY FOR 10 DAYS     Ear, Nose, and Throat: Nasal Preparations - Corticosteroids Passed - 04/27/2021  9:17 AM      Passed - Valid encounter within last 12 months    Recent Outpatient Visits           Today Family history of cancer   [facility] [place] Dense M, DO   1 week ago Diarrhea of presumed infectious origin   [facility] [place], Darl Householder, DO   1 month ago Mixed hyperlipidemia   [facility] [name], PA-C   7 months ago Chronic pain of both knees   [facility] [place], [place], [place]   9 months ago Primary hypertension   [facility] [name], PA-C       Future Appointments             In 4 months [name], PA-C [facility], [facility]

## 2021-05-03 ENCOUNTER — Encounter: Payer: Self-pay | Admitting: Family Medicine

## 2021-05-03 ENCOUNTER — Telehealth (INDEPENDENT_AMBULATORY_CARE_PROVIDER_SITE_OTHER): Payer: Medicare Other | Admitting: Family Medicine

## 2021-05-03 DIAGNOSIS — J069 Acute upper respiratory infection, unspecified: Secondary | ICD-10-CM

## 2021-05-03 DIAGNOSIS — Z0289 Encounter for other administrative examinations: Secondary | ICD-10-CM | POA: Diagnosis not present

## 2021-05-03 NOTE — Progress Notes (Signed)
Virtual Visit via Video Note  I connected with David Phillips on 05/03/21 at  4:00 PM EST by a video enabled telemedicine application and verified that I am speaking with the correct person using two identifiers.  Location: Patient: parking lot Provider: [facility]   I discussed the limitations of evaluation and management by telemedicine and the availability of in person appointments. The patient expressed understanding and agreed to proceed.  History of Present Illness:  Viral URI - much improved. Not coughing as much. Mucus has decreased. No troubles breathing.  Paperwork - needs paperwork filled out for debt forgiveness for student loan forbearance as he is not able to work. Is on disability for schizoaffective disorder. Gets SSI/SSDI. Sees Psychiatry at [facility]. On [place].   Observations/Objective:  Patient had trouble connecting to video visit, entirety of visit conducted over the phone.  Speaks in full sentences, no respiratory distress.   Assessment and Plan:  Viral URI Resolved.  Encounter for review of form Advised patient to drop off form to office with patient portion completed. Will review form and determine if able to be completed by primary care or would need to be Psychiatry. Patient verbalized understanding.     I discussed the assessment and treatment plan with the patient. The patient was provided an opportunity to ask questions and all were answered. The patient agreed with the plan and demonstrated an understanding of the instructions.   The patient was advised to call back or seek an in-person evaluation if the symptoms worsen or if the condition fails to improve as anticipated.  I provided 10 minutes of non-face-to-face time during this encounter.   Caro Laroche, DO

## 2021-05-17 ENCOUNTER — Telehealth: Payer: Self-pay | Admitting: Family Medicine

## 2021-05-17 NOTE — Telephone Encounter (Signed)
Copied from CRM [id number]. Topic: Medicare AWV >> May 17, 2021 10:33 AM Claudette Laws R wrote: Reason for CRM: No answer unable to leave a message for patient to call back and schedule Medicare Annual Wellness Visit (AWV) in office.   If unable to come into the office for AWV,  please offer to do virtually or by telephone.  Last AWV: 03/24/2020  Please schedule at anytime with [facility] Health Advisor.  40 minute appointment  Any questions, please contact me at [phone]

## 2021-05-19 ENCOUNTER — Other Ambulatory Visit: Payer: Self-pay

## 2021-05-19 ENCOUNTER — Encounter: Payer: Self-pay | Admitting: Nurse Practitioner

## 2021-05-19 ENCOUNTER — Telehealth (INDEPENDENT_AMBULATORY_CARE_PROVIDER_SITE_OTHER): Payer: Medicare Other | Admitting: Nurse Practitioner

## 2021-05-19 DIAGNOSIS — F250 Schizoaffective disorder, bipolar type: Secondary | ICD-10-CM | POA: Diagnosis not present

## 2021-05-19 NOTE — Progress Notes (Signed)
Name: David Phillips   MRN: [id number]    DOB: [birth year]   Date:05/19/2021       Progress Note  Subjective  Chief Complaint  Chief Complaint  Patient presents with   Follow-up    paperwork    I connected with  Ella Bodo  on 05/19/21 at 09:50 am by a video enabled telemedicine application and verified that I am speaking with the correct person using two identifiers.  I discussed the limitations of evaluation and management by telemedicine and the availability of in person appointments. The patient expressed understanding and agreed to proceed with a virtual visit  Staff also discussed with the patient that there may be a patient responsible charge related to this service. Patient Location: home Provider Location: [facility] Additional Individuals present: alone  HPI  Paperwork:  He is currently being treated for schizoaffective disorder at [facility]  He is requesting that we fill out disability paperwork to take care of his student loans.  He is currently on disability.  He says he can not afford student loan payments.  Discussed this would have to be done where he is receiving care.   Patient Active Problem List   Diagnosis Date Noted   Family history of cancer    COPD suggested by initial evaluation (HCC) 07/23/2020   Gastroesophageal reflux disease 07/23/2020   Family history of stomach cancer 04/27/2020   Chest pain 05/29/2018   Hyperlipidemia 03/19/2018   Chronic left hip pain 02/12/2018   Chronic left-sided low back pain without sciatica 02/12/2018   Neutrophilia 02/12/2018   Schizoaffective disorder, bipolar type (HCC) 02/12/2018   Encounter for tobacco use cessation counseling 02/12/2018   Overweight (BMI 25.0-29.9) 02/12/2018   Allergic rhinitis 03/23/2017   Essential hypertension 02/23/2017    Social History   Tobacco Use   Smoking status: Every Day    Packs/day: 2.00    Years: 20.00    Pack years: 40.00    Types: Cigarettes    Start date:  1998   Smokeless tobacco: Former   Tobacco comments:    currently at 1 pack daily--01/08/2020; has tried chantix, patches and gum without success  Substance Use Topics   Alcohol use: Yes    Alcohol/week: 6.0 standard drinks    Types: 6 Cans of beer per week    Comment: two 40 oz beers per night     Current Outpatient Medications:    albuterol (VENTOLIN HFA) 108 (90 Base) MCG/ACT inhaler, Inhale 2 puffs into the lungs every 4 (four) hours as needed for wheezing or shortness of breath (coughing fits)., Disp: 18 g, Rfl: 1   atorvastatin (LIPITOR) 20 MG tablet, Take 1 tablet (20 mg total) by mouth at bedtime. TAKE 1 TABLET(20 MG) BY MOUTH DAILY, Disp: 90 tablet, Rfl: 3   betamethasone valerate (VALISONE) 0.1 % cream, , Disp: , Rfl: 3   hydrOXYzine (ATARAX/VISTARIL) 50 MG tablet, Take 1 tablet (50 mg total) by mouth 3 (three) times daily as needed. (Patient taking differently: Take 50 mg by mouth every 6 (six) hours as needed.), Disp: 30 tablet, Rfl: 0   Iron-Vitamin C (VITRON-C) 65-125 MG TABS, Take 1 tablet by mouth daily., Disp: 90 tablet, Rfl: 0   losartan (COZAAR) 50 MG tablet, Take 1 tablet (50 mg total) by mouth daily., Disp: 90 tablet, Rfl: 3   Lurasidone HCl 120 MG TABS, Take 1 tablet (120 mg total) by mouth daily., Disp: 90 tablet, Rfl: 0   mometasone (NASONEX) 50 MCG/ACT nasal  spray, SHAKE LIQUID AND USE 2 SPRAYS IN EACH NOSTRIL DAILY FOR 10 DAYS, Disp: 51 g, Rfl: 0   loperamide (IMODIUM A-D) 2 MG tablet, Take 2 tablets (4 mg total) by mouth 4 (four) times daily as needed for diarrhea or loose stools. (Patient not taking: Reported on 05/19/2021), Disp: 30 tablet, Rfl: 1  Allergies  Allergen Reactions   Prednisone Nausea And Vomiting    I personally reviewed active problem list, medication list, allergies, notes from last encounter with the patient/caregiver today.  ROS  Constitutional: Negative for fever or weight change.  Respiratory: Negative for cough and shortness of breath.    Cardiovascular: Negative for chest pain or palpitations.  Gastrointestinal: Negative for abdominal pain, no bowel changes.  Musculoskeletal: Negative for gait problem or joint swelling.  Skin: Negative for rash.  Neurological: Negative for dizziness or headache.  No other specific complaints in a complete review of systems (except as listed in HPI above).   Objective  Virtual encounter, vitals not obtained.  There is no height or weight on file to calculate BMI.  Nursing Note and Vital Signs reviewed.  Physical Exam  Awake, alert and oriented  Assessment & Plan  1. Schizoaffective disorder, bipolar type (HCC) - unable to fill out paperwork for school loans,  follow-up with [facility] to have paperwork filled out  -Red flags and when to present for emergency care or RTC including fever >101.37F, chest pain, shortness of breath, new/worsening/un-resolving symptoms, reviewed with patient at time of visit. Follow up and care instructions discussed and provided in AVS. - I discussed the assessment and treatment plan with the patient. The patient was provided an opportunity to ask questions and all were answered. The patient agreed with the plan and demonstrated an understanding of the instructions.  I provided 20 minutes of non-face-to-face time during this encounter.  Berniece Salines, FNP

## 2021-05-25 ENCOUNTER — Encounter: Payer: Self-pay | Admitting: Family Medicine

## 2021-06-09 ENCOUNTER — Other Ambulatory Visit: Payer: Self-pay | Admitting: Family Medicine

## 2021-06-09 DIAGNOSIS — I10 Essential (primary) hypertension: Secondary | ICD-10-CM

## 2021-06-22 ENCOUNTER — Other Ambulatory Visit: Payer: Self-pay | Admitting: Family Medicine

## 2021-06-22 DIAGNOSIS — F250 Schizoaffective disorder, bipolar type: Secondary | ICD-10-CM

## 2021-06-23 ENCOUNTER — Other Ambulatory Visit: Payer: Self-pay | Admitting: Family Medicine

## 2021-06-23 DIAGNOSIS — F250 Schizoaffective disorder, bipolar type: Secondary | ICD-10-CM

## 2021-06-30 ENCOUNTER — Encounter: Payer: Self-pay | Admitting: Nurse Practitioner

## 2021-06-30 ENCOUNTER — Telehealth (INDEPENDENT_AMBULATORY_CARE_PROVIDER_SITE_OTHER): Payer: Medicare Other | Admitting: Nurse Practitioner

## 2021-06-30 VITALS — Ht 69.0 in | Wt 173.0 lb

## 2021-06-30 DIAGNOSIS — M25531 Pain in right wrist: Secondary | ICD-10-CM

## 2021-06-30 DIAGNOSIS — M25532 Pain in left wrist: Secondary | ICD-10-CM

## 2021-06-30 DIAGNOSIS — F17219 Nicotine dependence, cigarettes, with unspecified nicotine-induced disorders: Secondary | ICD-10-CM | POA: Diagnosis not present

## 2021-06-30 MED ORDER — NICOTROL NS 10 MG/ML NA SOLN: NASAL | 0 refills | Status: AC

## 2021-06-30 NOTE — Progress Notes (Signed)
Name: David Phillips   MRN: [id number]    DOB: Jul 11, 1985   Date:06/30/2021       Progress Note  Subjective  Chief Complaint  Chief Complaint  Patient presents with   Nicotine Dependence    Smoking cessation    I connected with  David Phillips  on 06/30/21 at 11:00 am by a video enabled telemedicine application and verified that I am speaking with the correct person using two identifiers.  I discussed the limitations of evaluation and management by telemedicine and the availability of in person appointments. The patient expressed understanding and agreed to proceed with a virtual visit  Staff also discussed with the patient that there may be a patient responsible charge related to this service. Patient Location: home Provider Location: [facility] Additional Individuals present: alone  HPI  Nicotine dependence: He says he has been a smoker since he was 36 years old. He says he has smoked up to two packs a day.  He says if he is not drinking he smokes one back a day.  Discussed different options for cessations including the patch.  He was not interested in those choices. He wanted to do the nasal spray. Discussed how to use and to not smoke while using this product.  He verbalized understanding.   Bilateral wrist pain:  He says he has had bilateral wrist pain for about four years.  He says that they are constantly popping.  He says he does not take anything for them when they hurt.  He describes the pain as an achy.  He denies any new injury. He says he does have an orthopedic provider that he sees for his knee.  Discussed following up with his orthopedic provider.   Patient Active Problem List   Diagnosis Date Noted   Family history of cancer    COPD suggested by initial evaluation ([place]) 07/23/2020   Gastroesophageal reflux disease 07/23/2020   Family history of stomach cancer 04/27/2020   Chest pain 05/29/2018   Hyperlipidemia 03/19/2018   Chronic left hip pain 02/12/2018   Chronic  left-sided low back pain without sciatica 02/12/2018   Neutrophilia 02/12/2018   Schizoaffective disorder, bipolar type ([place]) 02/12/2018   Encounter for tobacco use cessation counseling 02/12/2018   Overweight (BMI 25.0-29.9) 02/12/2018   Allergic rhinitis 03/23/2017   Essential hypertension 02/23/2017    Social History   Tobacco Use   Smoking status: Every Day    Packs/day: 2.00    Years: 20.00    Pack years: 40.00    Types: Cigarettes    Start date: 1998   Smokeless tobacco: Former   Tobacco comments:    currently at 1 pack daily--01/08/2020; has tried chantix, patches and gum without success  Substance Use Topics   Alcohol use: Yes    Alcohol/week: 6.0 standard drinks    Types: 6 Cans of beer per week    Comment: two 40 oz beers per night     Current Outpatient Medications:    albuterol (VENTOLIN HFA) 108 (90 Base) MCG/ACT inhaler, Inhale 2 puffs into the lungs every 4 (four) hours as needed for wheezing or shortness of breath (coughing fits)., Disp: 18 g, Rfl: 1   atorvastatin (LIPITOR) 20 MG tablet, Take 1 tablet (20 mg total) by mouth at bedtime. TAKE 1 TABLET(20 MG) BY MOUTH DAILY, Disp: 90 tablet, Rfl: 3   betamethasone valerate (VALISONE) 0.1 % cream, , Disp: , Rfl: 3   hydrOXYzine (ATARAX/VISTARIL) 50 MG tablet, Take 1 tablet (50  mg total) by mouth 3 (three) times daily as needed. (Patient taking differently: Take 50 mg by mouth every 6 (six) hours as needed.), Disp: 30 tablet, Rfl: 0   Iron-Vitamin C (VITRON-C) 65-125 MG TABS, Take 1 tablet by mouth daily., Disp: 90 tablet, Rfl: 0   losartan (COZAAR) 50 MG tablet, Take 1 tablet (50 mg total) by mouth daily., Disp: 90 tablet, Rfl: 3   loperamide (IMODIUM A-D) 2 MG tablet, Take 2 tablets (4 mg total) by mouth 4 (four) times daily as needed for diarrhea or loose stools. (Patient not taking: Reported on 05/19/2021), Disp: 30 tablet, Rfl: 1   Lurasidone HCl 120 MG TABS, Take 1 tablet (120 mg total) by mouth daily. (Patient  not taking: Reported on 06/30/2021), Disp: 90 tablet, Rfl: 0   mometasone (NASONEX) 50 MCG/ACT nasal spray, SHAKE LIQUID AND USE 2 SPRAYS IN EACH NOSTRIL DAILY FOR 10 DAYS (Patient not taking: Reported on 06/30/2021), Disp: 51 g, Rfl: 0  Allergies  Allergen Reactions   Prednisone Nausea And Vomiting    I personally reviewed active problem list, medication list, allergies, notes from last encounter with the patient/caregiver today.  ROS  Constitutional: Negative for fever or weight change.  Respiratory: Negative for cough and shortness of breath.   Cardiovascular: Negative for chest pain or palpitations.  Gastrointestinal: Negative for abdominal pain, no bowel changes.  Musculoskeletal: Negative for gait problem or joint swelling. Positive for bilateral wrist pain.  Skin: Negative for rash.  Neurological: Negative for dizziness or headache.  No other specific complaints in a complete review of systems (except as listed in HPI above).   Objective  Virtual encounter, vitals not obtained.  Body mass index is 25.55 kg/m.  Nursing Note and Vital Signs reviewed.  Physical Exam  Awake, alert and oriented, speaking in complete sentences  No results found for this or any previous visit (from the past 72 hour(s)).  Assessment & Plan  1. Cigarette nicotine dependence with nicotine-induced disorder -do not smoke while using this medication  - Nicotine (NICOTROL NS) 10 MG/ML SOLN; 1 spray in each nostril 1-2 times an hour  Dispense: 1 mL; Refill: 0  -follow-up in one month   2. Bilateral wrist pain - follow-up with orthopedic provider   -Red flags and when to present for emergency care or RTC including fever >101.23F, chest pain, shortness of breath, new/worsening/un-resolving symptoms, reviewed with patient at time of visit. Follow up and care instructions discussed and provided in AVS. - I discussed the assessment and treatment plan with the patient. The patient was provided an  opportunity to ask questions and all were answered. The patient agreed with the plan and demonstrated an understanding of the instructions.  I provided 20 minutes of non-face-to-face time during this encounter.  Bo Merino, FNP

## 2021-07-26 ENCOUNTER — Telehealth: Payer: Self-pay | Admitting: Family Medicine

## 2021-07-26 NOTE — Telephone Encounter (Signed)
Copied from [place] [id number]. Topic: Medicare AWV >> Jul 26, 2021 11:11 AM Cher Nakai R wrote: Reason for CRM:  Left message for patient to call back and schedule Medicare Annual Wellness Visit (AWV) in office.   If unable to come into the office for AWV,  please offer to do virtually or by telephone.  Last AWV: 03/24/2020  Please schedule at anytime with [place].  40 minute appointment  Any questions, please contact me at [phone]

## 2021-07-30 ENCOUNTER — Encounter: Payer: Self-pay | Admitting: Nurse Practitioner

## 2021-07-30 ENCOUNTER — Telehealth (INDEPENDENT_AMBULATORY_CARE_PROVIDER_SITE_OTHER): Payer: Medicare Other | Admitting: Nurse Practitioner

## 2021-07-30 ENCOUNTER — Other Ambulatory Visit: Payer: Self-pay

## 2021-07-30 DIAGNOSIS — F250 Schizoaffective disorder, bipolar type: Secondary | ICD-10-CM | POA: Diagnosis not present

## 2021-07-30 DIAGNOSIS — I10 Essential (primary) hypertension: Secondary | ICD-10-CM

## 2021-07-30 DIAGNOSIS — E782 Mixed hyperlipidemia: Secondary | ICD-10-CM | POA: Diagnosis not present

## 2021-07-30 MED ORDER — LATUDA 120 MG PO TABS: 120.0000 mg | ORAL_TABLET | Freq: Every day | ORAL | 0 refills | Status: DC

## 2021-07-30 MED ORDER — ATORVASTATIN CALCIUM 20 MG PO TABS: 20.0000 mg | ORAL_TABLET | Freq: Every day | ORAL | 3 refills | Status: DC

## 2021-07-30 MED ORDER — LOSARTAN POTASSIUM 50 MG PO TABS: 50.0000 mg | ORAL_TABLET | Freq: Every day | ORAL | 3 refills | Status: DC

## 2021-07-30 NOTE — Progress Notes (Signed)
Name: David Phillips   MRN: [id number]    DOB: 1986/04/26   Date:07/30/2021       Progress Note  Subjective  Chief Complaint  Chief Complaint  Patient presents with   Hyperlipidemia   Hypertension   Referral    To psychiatry,discuss [place]    I connected with  David Phillips  on 07/30/21 at 3:35 pm by a video enabled telemedicine application and verified that I am speaking with the correct person using two identifiers.  I discussed the limitations of evaluation and management by telemedicine and the availability of in person appointments. The patient expressed understanding and agreed to proceed with a virtual visit  Staff also discussed with the patient that there may be a patient responsible charge related to this service. Patient Location: home Provider Location: [facility] Additional Individuals present: alone  HPI  Schizoaffective disorder:  He was being treated for schizoaffective disorder at [facility].  He says that he is no longer going there.  He says Delsa Grana put in a referral for psychiatry but he has not been able to get an appointment.  Placed new referral to [facility] to get him in sooner.  Discussed that I would refill his latuda to get him to his psychiatric appointment.    Hyperlipidemia: He currently takes atorvastatin 20 mg daily.  He denies any myalgia.  His last LDL was 64 on 07/23/2020.   HTN: He currently takes losartan 50 mg daily.  He does not check his blood pressure at home.  His last blood pressure in the office was 122/64 on 03/08/2021. He denies any chest pain, shortness of breath, headaches or blurred vision.   Patient Active Problem List   Diagnosis Date Noted   Family history of cancer    COPD suggested by initial evaluation ([place]) 07/23/2020   Gastroesophageal reflux disease 07/23/2020   Family history of stomach cancer 04/27/2020   Chest pain 05/29/2018   Hyperlipidemia 03/19/2018   Chronic left hip pain 02/12/2018   Chronic left-sided low  back pain without sciatica 02/12/2018   Neutrophilia 02/12/2018   Schizoaffective disorder, bipolar type ([place]) 02/12/2018   Encounter for tobacco use cessation counseling 02/12/2018   Overweight (BMI 25.0-29.9) 02/12/2018   Allergic rhinitis 03/23/2017   Essential hypertension 02/23/2017    Social History   Tobacco Use   Smoking status: Every Day    Packs/day: 2.00    Years: 20.00    Pack years: 40.00    Types: Cigarettes    Start date: 1998   Smokeless tobacco: Former   Tobacco comments:    currently at 1 pack daily--01/08/2020; has tried chantix, patches and gum without success  Substance Use Topics   Alcohol use: Yes    Alcohol/week: 6.0 standard drinks    Types: 6 Cans of beer per week    Comment: two 40 oz beers per night     Current Outpatient Medications:    albuterol (VENTOLIN HFA) 108 (90 Base) MCG/ACT inhaler, Inhale 2 puffs into the lungs every 4 (four) hours as needed for wheezing or shortness of breath (coughing fits)., Disp: 18 g, Rfl: 1   atorvastatin (LIPITOR) 20 MG tablet, Take 1 tablet (20 mg total) by mouth at bedtime. TAKE 1 TABLET(20 MG) BY MOUTH DAILY, Disp: 90 tablet, Rfl: 3   betamethasone valerate (VALISONE) 0.1 % cream, , Disp: , Rfl: 3   hydrOXYzine (ATARAX/VISTARIL) 50 MG tablet, Take 1 tablet (50 mg total) by mouth 3 (three) times daily as needed. (  Patient taking differently: Take 50 mg by mouth every 6 (six) hours as needed.), Disp: 30 tablet, Rfl: 0   Iron-Vitamin C (VITRON-C) 65-125 MG TABS, Take 1 tablet by mouth daily., Disp: 90 tablet, Rfl: 0   losartan (COZAAR) 50 MG tablet, Take 1 tablet (50 mg total) by mouth daily., Disp: 90 tablet, Rfl: 3   Nicotine (NICOTROL NS) 10 MG/ML SOLN, 1 spray in each nostril 1-2 times an hour, Disp: 1 mL, Rfl: 0  Allergies  Allergen Reactions   Prednisone Nausea And Vomiting    I personally reviewed active problem list, medication list, allergies, notes from last encounter with the patient/caregiver  today.  ROS  Constitutional: Negative for fever or weight change.  Respiratory: Negative for cough and shortness of breath.   Cardiovascular: Negative for chest pain or palpitations.  Gastrointestinal: Negative for abdominal pain, no bowel changes.  Musculoskeletal: Negative for gait problem or joint swelling.  Skin: Negative for rash.  Neurological: Negative for dizziness or headache.  No other specific complaints in a complete review of systems (except as listed in HPI above).   Objective  Virtual encounter, vitals not obtained.  There is no height or weight on file to calculate BMI.  Nursing Note and Vital Signs reviewed.  Physical Exam  Awake,alert and oriented, speaking in complete sentences  No results found for this or any previous visit (from the past 72 hour(s)).  Assessment & Plan  1. Schizoaffective disorder, bipolar type ([place])  - Ambulatory referral to Psychiatry to [facility] - Lurasidone HCl (LATUDA) 120 MG TABS; Take 1 tablet (120 mg total) by mouth daily.  Dispense: 30 tablet; Refill: 0  2. Mixed hyperlipidemia -next visit needs to be in person to get labs - atorvastatin (LIPITOR) 20 MG tablet; Take 1 tablet (20 mg total) by mouth at bedtime. TAKE 1 TABLET(20 MG) BY MOUTH DAILY  Dispense: 90 tablet; Refill: 3  3. Essential hypertension -next visit needs to be in person to get labs - losartan (COZAAR) 50 MG tablet; Take 1 tablet (50 mg total) by mouth daily.  Dispense: 90 tablet; Refill: 3   -Red flags and when to present for emergency care or RTC including fever >101.82F, chest pain, shortness of breath, new/worsening/un-resolving symptoms,  reviewed with patient at time of visit. Follow up and care instructions discussed and provided in AVS. - I discussed the assessment and treatment plan with the patient. The patient was provided an opportunity to ask questions and all were answered. The patient agreed with the plan and demonstrated an understanding of the  instructions.  I provided 15 minutes of non-face-to-face time during this encounter.  Bo Merino, FNP

## 2021-08-02 ENCOUNTER — Encounter: Payer: Self-pay | Admitting: Family Medicine

## 2021-08-02 ENCOUNTER — Telehealth: Payer: Self-pay | Admitting: Family Medicine

## 2021-08-02 NOTE — Telephone Encounter (Signed)
Spoke with patient and informed him that the providers here at our office will not write letters for emotional support animals, but will refer patient to a psychiatrist.  Patient stated that he could not get into his psychiatrist for 3 months.  Patient also stated that he is unable to pay his student loans and our office won't write a letter to for that either.  I informed patient this is why he was referred to psychiatrist. Patient became upset and stated that maybe is should just find another "f**king doctor to go to.  I told patient that would be fine and patient stated that he would find another "f**king doctor.

## 2021-08-02 NOTE — Telephone Encounter (Signed)
[phone]  Would like to speak with someone about getting a letter for him to have his emotional support dog at his new apartment. Stated they are requiring a letter.

## 2021-08-02 NOTE — Telephone Encounter (Signed)
Patient called to cancel AWV. He was very unhappy and was using un-acceptable language when speaking to me about [facility].  I ended up ending the call due to his continued use of bad language.

## 2021-08-23 ENCOUNTER — Other Ambulatory Visit: Payer: Self-pay | Admitting: Nurse Practitioner

## 2021-08-23 DIAGNOSIS — F250 Schizoaffective disorder, bipolar type: Secondary | ICD-10-CM

## 2021-08-24 NOTE — Telephone Encounter (Signed)
Requested medications are due for refill today.  yes ? ?Requested medications are on the active medications list.  yes ? ?Last refill. 07/30/2021 #30 0 refills ? ?Future visit scheduled.   no ? ?Notes to clinic.  Medication refill is not delegated. ? ? ? ?Requested Prescriptions  ?Pending Prescriptions Disp Refills  ? LATUDA 120 MG TABS [Pharmacy Med Name: LURASIDONE 120MG  TABLETS] 30 tablet 0  ?  Sig: TAKE 1 TABLET(120 MG) BY MOUTH DAILY  ?  ? Not Delegated - Psychiatry:  Antipsychotics - Second Generation (Atypical) - lurasidone Failed - 08/23/2021  2:45 PM  ?  ?  Failed - This refill cannot be delegated  ?  ?  Failed - TSH in normal range and within 360 days  ?  TSH  ?Date Value Ref Range Status  ?02/23/2017 1.470 0.450 - 4.500 uIU/mL Final  ?  ?  ?  ?  Failed - Last BP in normal range  ?  BP Readings from Last 1 Encounters:  ?03/15/21 (!) 138/98  ?  ?  ?  ?  Failed - Lipid Panel in normal range within the last 12 months  ?  Cholesterol, Total  ?Date Value Ref Range Status  ?02/23/2017 191 100 - 199 mg/dL Final  ? ?Cholesterol  ?Date Value Ref Range Status  ?07/23/2020 154 <200 mg/dL Final  ? ?LDL Cholesterol (Calc)  ?Date Value Ref Range Status  ?07/23/2020 64 mg/dL (calc) Final  ?  Comment:  ?  Reference range: <100 ?Marland Kitchen ?Desirable range <100 mg/dL for primary prevention;   ?<70 mg/dL for patients with CHD or diabetic patients  ?with > or = 2 CHD risk factors. ?. ?LDL-C is now calculated using the Martin-Hopkins  ?calculation, which is a validated novel method providing  ?better accuracy than the Friedewald equation in the  ?estimation of LDL-C.  ?Horald Pollen et al. Lenox Ahr. 4081;448(18): 2061-2068  ?(http://education.[employer].com/faq/FAQ164) ?  ? ?HDL  ?Date Value Ref Range Status  ?07/23/2020 52 > OR = 40 mg/dL Final  ?56/31/4970 36 (L) >39 mg/dL Final  ? ?Triglycerides  ?Date Value Ref Range Status  ?07/23/2020 319 (H) <150 mg/dL Final  ?  Comment:  ?  . ?If a non-fasting specimen was collected,  consider ?repeat triglyceride testing on a fasting specimen ?if clinically indicated.  ?Henrene Pastor al. J. of Clin. Lipidol. 2015;9:129-169. ?. ?  ? ?  ?  ?  Passed - Last Heart Rate in normal range  ?  Pulse Readings from Last 1 Encounters:  ?03/15/21 85  ?  ?  ?  ?  Passed - Valid encounter within last 6 months  ?  Recent Outpatient Visits   ? ?      ? 3 weeks ago Schizoaffective disorder, bipolar type (HCC)  ? [facility] Della Goo F, FNP  ? 1 month ago Cigarette nicotine dependence with nicotine-induced disorder  ? [facility] Della Goo F, FNP  ? 3 months ago Schizoaffective disorder, bipolar type [facility])  ? [facility] Della Goo F, FNP  ? 3 months ago Encounter for review of form with patient  ? [facility] [place] D[place] M, DO  ? 3 months ago Family history of cancer  ? [facility] [place] M, DO  ? ?  ?  ? ?  ?  ?  Passed - CBC within normal limits and completed in the last 12 months  ?  WBC  ?Date Value Ref  Range Status  ?02/26/2021 13.5 (H) 4.0 - 10.5 K/uL Final  ? ?RBC  ?Date Value Ref Range Status  ?02/26/2021 4.72 4.22 - 5.81 MIL/uL Final  ? ?Hemoglobin  ?Date Value Ref Range Status  ?02/26/2021 16.5 13.0 - 17.0 g/dL Final  ?41/66/0630 16.0 13.0 - 17.7 g/dL Final  ? ?HCT  ?Date Value Ref Range Status  ?02/26/2021 44.6 39.0 - 52.0 % Final  ? ?Hematocrit  ?Date Value Ref Range Status  ?02/23/2017 48.8 37.5 - 51.0 % Final  ? ?MCHC  ?Date Value Ref Range Status  ?02/26/2021 37.0 (H) 30.0 - 36.0 g/dL Final  ? ?MCH  ?Date Value Ref Range Status  ?02/26/2021 35.0 (H) 26.0 - 34.0 pg Final  ? ?MCV  ?Date Value Ref Range Status  ?02/26/2021 94.5 80.0 - 100.0 fL Final  ?02/23/2017 97 79 - 97 fL Final  ?04/13/2013 96 80 - 100 fL Final  ? ?No results found for: PLTCOUNTKUC, LABPLAT, POCPLA ?RDW  ?Date Value Ref Range Status  ?02/26/2021 13.3 11.5 - 15.5 % Final  ?02/23/2017 13.4 12.3 - 15.4 % Final   ?04/13/2013 13.5 11.5 - 14.5 % Final  ? ?  ?  ?  Passed - CMP within normal limits and completed in the last 12 months  ?  Albumin  ?Date Value Ref Range Status  ?02/16/2021 4.5 3.5 - 5.0 g/dL Final  ?10/93/2355 4.5 3.5 - 5.5 g/dL Final  ?73/22/0254 4.2 3.4 - 5.0 g/dL Final  ? ?Alkaline Phosphatase  ?Date Value Ref Range Status  ?02/16/2021 62 38 - 126 U/L Final  ?04/13/2013 94 50 - 136 Unit/L Final  ? ?Alkaline phosphatase (APISO)  ?Date Value Ref Range Status  ?07/23/2020 68 36 - 130 U/L Final  ? ?ALT  ?Date Value Ref Range Status  ?02/16/2021 34 0 - 44 U/L Final  ? ?SGPT (ALT)  ?Date Value Ref Range Status  ?04/13/2013 43 12 - 78 U/L Final  ? ?AST  ?Date Value Ref Range Status  ?02/16/2021 27 15 - 41 U/L Final  ? ?SGOT(AST)  ?Date Value Ref Range Status  ?04/13/2013 29 15 - 37 Unit/L Final  ? ?BUN  ?Date Value Ref Range Status  ?02/16/2021 10 6 - 20 mg/dL Final  ?27/11/2374 8 6 - 20 mg/dL Final  ?28/31/5176 10 7 - 18 mg/dL Final  ? ?Calcium  ?Date Value Ref Range Status  ?02/16/2021 9.4 8.9 - 10.3 mg/dL Final  ? ?Calcium, Total  ?Date Value Ref Range Status  ?04/13/2013 9.1 8.5 - 10.1 mg/dL Final  ? ?Calcium, Ion  ?Date Value Ref Range Status  ?06/15/2010 1.06 (L) 1.12 - 1.32 mmol/L Final  ? ?CO2  ?Date Value Ref Range Status  ?02/16/2021 28 22 - 32 mmol/L Final  ? ?Co2  ?Date Value Ref Range Status  ?04/13/2013 26 21 - 32 mmol/L Final  ? ?TCO2  ?Date Value Ref Range Status  ?06/15/2010 25 0 - 100 mmol/L Final  ? ?Creat  ?Date Value Ref Range Status  ?07/23/2020 0.99 0.60 - 1.35 mg/dL Final  ? ?Creatinine, Ser  ?Date Value Ref Range Status  ?02/16/2021 0.94 0.61 - 1.24 mg/dL Final  ? ?Glucose  ?Date Value Ref Range Status  ?04/13/2013 93 65 - 99 mg/dL Final  ? ?Glucose, Bld  ?Date Value Ref Range Status  ?02/16/2021 116 (H) 70 - 99 mg/dL Final  ?  Comment:  ?  Glucose reference range applies only to samples taken after fasting for at least 8 hours.  ? ?Potassium  ?  Date Value Ref Range Status  ?02/16/2021 4.4 3.5  - 5.1 mmol/L Final  ?04/13/2013 3.7 3.5 - 5.1 mmol/L Final  ? ?Sodium  ?Date Value Ref Range Status  ?02/16/2021 136 135 - 145 mmol/L Final  ?02/23/2017 141 134 - 144 mmol/L Final  ?04/13/2013 135 (L) 136 - 145 mmol/L Final  ? ?Total Bilirubin  ?Date Value Ref Range Status  ?02/16/2021 0.3 0.3 - 1.2 mg/dL Final  ? ?Bilirubin,Total  ?Date Value Ref Range Status  ?04/13/2013 0.5 0.2 - 1.0 mg/dL Final  ? ?Bilirubin Total  ?Date Value Ref Range Status  ?02/23/2017 <0.2 0.0 - 1.2 mg/dL Final  ? ?Protein, ur  ?Date Value Ref Range Status  ?07/23/2020 TRACE (A) NEGATIVE Final  ? ?Total Protein  ?Date Value Ref Range Status  ?02/16/2021 7.2 6.5 - 8.1 g/dL Final  ?66/29/4765 7.1 6.0 - 8.5 g/dL Final  ?46/50/3546 7.4 6.4 - 8.2 g/dL Final  ? ?GFR, Est African American  ?Date Value Ref Range Status  ?07/23/2020 114 > OR = 60 mL/min/1.62m2 Final  ? ?GFR, Est Non African American  ?Date Value Ref Range Status  ?07/23/2020 98 > OR = 60 mL/min/1.29m2 Final  ? ?GFR, Estimated  ?Date Value Ref Range Status  ?02/16/2021 >60 >60 mL/min Final  ?  Comment:  ?  (NOTE) ?Calculated using the CKD-EPI Creatinine Equation (2021) ?  ? ?  ?  ?  ?  ?

## 2021-09-07 ENCOUNTER — Ambulatory Visit: Payer: Medicare Other | Admitting: Family Medicine

## 2021-12-01 ENCOUNTER — Telehealth: Payer: Medicare Other | Admitting: Physician Assistant

## 2021-12-01 ENCOUNTER — Ambulatory Visit: Payer: Self-pay

## 2021-12-01 DIAGNOSIS — W540XXA Bitten by dog, initial encounter: Secondary | ICD-10-CM

## 2021-12-01 DIAGNOSIS — S0185XA Open bite of other part of head, initial encounter: Secondary | ICD-10-CM | POA: Diagnosis not present

## 2021-12-01 MED ORDER — TETANUS-DIPHTHERIA TOXOIDS TD 5-2 LFU IM INJ: 0.5000 mL | INJECTION | Freq: Once | INTRAMUSCULAR | 0 refills | Status: AC

## 2021-12-01 NOTE — Addendum Note (Signed)
Addended by: Mar Daring on: 12/01/2021 02:48 PM   Modules accepted: Orders

## 2021-12-01 NOTE — Telephone Encounter (Signed)
  Chief Complaint: dog bite Symptoms: dog bite to face, deep cut that he has been treating and some scratches Frequency: happened a couple of days ago Pertinent Negatives: NA Disposition: [] ED /[] Urgent Care (no appt availability in office) / [] Appointment(In office/virtual)/ [x]  [place]/ [] Home Care/ [] Refused Recommended Disposition /[] [facility]/ []  Follow-up with PCP Additional Notes: pt was dismissed by [facility] so advised pt he can go to UC or do virtual UC visit. Pt was unsure of location of both UC I offered appts to and wants to do virtual first just to see if he needs any treatment or tetanus shot or not. Scheduled for virtual UC at 1400.   Reason for Disposition  [1] Any break in skin from BITE (e.g., cut, puncture or scratch) AND[2] PET animal (e.g., dog, cat, or ferret) at risk for RABIES (e.g., sick, stray, unprovoked bite, developing country)  Answer Assessment - Initial Assessment Questions 1. ANIMAL: "What type of animal caused the bite?" "Is the injury from a bite or a claw?" If the animal is a dog or a cat, ask: "Was it a pet or a stray?" "Was it acting ill or behaving strangely?"     Dog bite  2. LOCATION: "Where is the bite located?"      face 3. SIZE: "How big is the bite?" "What does it look like?"      Pretty big 4. ONSET: "When did the bite happen?" (Minutes or hours ago)      Couple of days ago  5. CIRCUMSTANCES: "Tell me how this happened."      Walking down the hallway of apt and dog attacked him  6. TETANUS: "When was the last tetanus booster?"     unsure  Protocols used: Animal Bite-A-AH

## 2021-12-01 NOTE — Progress Notes (Signed)
Virtual Visit Consent   David Phillips, you are scheduled for a virtual visit with a [facility] provider today. Just as with appointments in the office, your consent must be obtained to participate. Your consent will be active for this visit and any virtual visit you may have with one of our providers in the next 365 days. If you have a MyChart account, a copy of this consent can be sent to you electronically.  As this is a virtual visit, video technology does not allow for your provider to perform a traditional examination. This may limit your provider's ability to fully assess your condition. If your provider identifies any concerns that need to be evaluated in person or the need to arrange testing (such as labs, EKG, etc.), we will make arrangements to do so. Although advances in technology are sophisticated, we cannot ensure that it will always work on either your end or our end. If the connection with a video visit is poor, the visit may have to be switched to a telephone visit. With either a video or telephone visit, we are not always able to ensure that we have a secure connection.  By engaging in this virtual visit, you consent to the provision of healthcare and authorize for your insurance to be billed (if applicable) for the services provided during this visit. Depending on your insurance coverage, you may receive a charge related to this service.  I need to obtain your verbal consent now. Are you willing to proceed with your visit today? David Phillips has provided verbal consent on 12/01/2021 for a virtual visit (video or telephone). Margaretann Loveless, PA-C  Date: 12/01/2021 2:07 PM  Virtual Visit via Video Note   I, Margaretann Loveless, connected with  David Phillips  ([id number], 1985/07/10) on 12/01/21 at  2:00 PM EDT by a video-enabled telemedicine application and verified that I am speaking with the correct person using two identifiers.  Location: Patient: Virtual Visit Location  Patient: Home Provider: Virtual Visit Location Provider: Home Office   I discussed the limitations of evaluation and management by telemedicine and the availability of in person appointments. The patient expressed understanding and agreed to proceed.    History of Present Illness: David Phillips is a 36 y.o. who identifies as a male who was assigned male at birth, and is being seen today for dog bite.  HPI: Animal Bite  The incident occurred more than 2 days ago. The incident occurred at home. There is an injury to the Face. The pain is mild. It is unlikely that a foreign body is present. Pertinent negatives include no chest pain, no numbness, no visual disturbance, no abdominal pain, no nausea, no headaches, no hearing loss, no inability to bear weight, no neck pain, no focal weakness, no decreased responsiveness, no light-headedness, no loss of consciousness, no weakness, no cough and no difficulty breathing. There have been no prior injuries to these areas. His tetanus status is unknown. He has been Behaving normally. There were no sick contacts. He has received no recent medical care.   Neighbors dog. Not UTD on vaccines. Dog is in quarantine for 10 days monitoring for signs of rabies.   Problems:  Patient Active Problem List   Diagnosis Date Noted   Family history of cancer    COPD suggested by initial evaluation (HCC) 07/23/2020   Gastroesophageal reflux disease 07/23/2020   Family history of stomach cancer 04/27/2020   Chest pain 05/29/2018   Hyperlipidemia 03/19/2018   Chronic left  hip pain 02/12/2018   Chronic left-sided low back pain without sciatica 02/12/2018   Neutrophilia 02/12/2018   Schizoaffective disorder, bipolar type (HCC) 02/12/2018   Encounter for tobacco use cessation counseling 02/12/2018   Overweight (BMI 25.0-29.9) 02/12/2018   Allergic rhinitis 03/23/2017   Essential hypertension 02/23/2017    Allergies:  Allergies  Allergen Reactions   Prednisone Nausea  And Vomiting   Medications:  Current Outpatient Medications:    albuterol (VENTOLIN HFA) 108 (90 Base) MCG/ACT inhaler, Inhale 2 puffs into the lungs every 4 (four) hours as needed for wheezing or shortness of breath (coughing fits)., Disp: 18 g, Rfl: 1   atorvastatin (LIPITOR) 20 MG tablet, Take 1 tablet (20 mg total) by mouth at bedtime. TAKE 1 TABLET(20 MG) BY MOUTH DAILY, Disp: 90 tablet, Rfl: 3   betamethasone valerate (VALISONE) 0.1 % cream, , Disp: , Rfl: 3   hydrOXYzine (ATARAX/VISTARIL) 50 MG tablet, Take 1 tablet (50 mg total) by mouth 3 (three) times daily as needed. (Patient taking differently: Take 50 mg by mouth every 6 (six) hours as needed.), Disp: 30 tablet, Rfl: 0   Iron-Vitamin C (VITRON-C) 65-125 MG TABS, Take 1 tablet by mouth daily., Disp: 90 tablet, Rfl: 0   losartan (COZAAR) 50 MG tablet, Take 1 tablet (50 mg total) by mouth daily., Disp: 90 tablet, Rfl: 3   Lurasidone HCl (LATUDA) 120 MG TABS, Take 1 tablet (120 mg total) by mouth daily., Disp: 30 tablet, Rfl: 0   Nicotine (NICOTROL NS) 10 MG/ML SOLN, 1 spray in each nostril 1-2 times an hour, Disp: 1 mL, Rfl: 0  Observations/Objective: Patient is well-developed, well-nourished in no acute distress.  Resting comfortably at home.  Head is normocephalic, atraumatic.  No labored breathing.  Speech is clear and coherent with logical content.  Patient is alert and oriented at baseline.  Bite laceration noted on the nose, no signs of infection noted  Assessment and Plan: 1. Dog bite, initial encounter - Td vaccine preservative free greater than or equal to 7yo IM  - No signs of infection currently - Keep clean and dry - May continue bacitracin to wound - Patient declines work up for need of rabies vaccination at this time, desires to await if dog is positive - Td booster sent to pharmacy for patient to get there - Seek in person evaluation if not improving or if signs of infection develop  Follow Up  Instructions: I discussed the assessment and treatment plan with the patient. The patient was provided an opportunity to ask questions and all were answered. The patient agreed with the plan and demonstrated an understanding of the instructions.  A copy of instructions were sent to the patient via MyChart unless otherwise noted below.    The patient was advised to call back or seek an in-person evaluation if the symptoms worsen or if the condition fails to improve as anticipated.  Time:  I spent 10 minutes with the patient via telehealth technology discussing the above problems/concerns.    Margaretann Loveless, PA-C

## 2021-12-01 NOTE — Patient Instructions (Signed)
David Phillips, thank you for joining Margaretann Loveless, PA-C for today's virtual visit.  While this provider is not your primary care provider (PCP), if your PCP is located in our provider database this encounter information will be shared with them immediately following your visit.  Consent: (Patient) David Phillips provided verbal consent for this virtual visit at the beginning of the encounter.  Current Medications:  Current Outpatient Medications:    albuterol (VENTOLIN HFA) 108 (90 Base) MCG/ACT inhaler, Inhale 2 puffs into the lungs every 4 (four) hours as needed for wheezing or shortness of breath (coughing fits)., Disp: 18 g, Rfl: 1   atorvastatin (LIPITOR) 20 MG tablet, Take 1 tablet (20 mg total) by mouth at bedtime. TAKE 1 TABLET(20 MG) BY MOUTH DAILY, Disp: 90 tablet, Rfl: 3   betamethasone valerate (VALISONE) 0.1 % cream, , Disp: , Rfl: 3   hydrOXYzine (ATARAX/VISTARIL) 50 MG tablet, Take 1 tablet (50 mg total) by mouth 3 (three) times daily as needed. (Patient taking differently: Take 50 mg by mouth every 6 (six) hours as needed.), Disp: 30 tablet, Rfl: 0   Iron-Vitamin C (VITRON-C) 65-125 MG TABS, Take 1 tablet by mouth daily., Disp: 90 tablet, Rfl: 0   losartan (COZAAR) 50 MG tablet, Take 1 tablet (50 mg total) by mouth daily., Disp: 90 tablet, Rfl: 3   Lurasidone HCl (LATUDA) 120 MG TABS, Take 1 tablet (120 mg total) by mouth daily., Disp: 30 tablet, Rfl: 0   Nicotine (NICOTROL NS) 10 MG/ML SOLN, 1 spray in each nostril 1-2 times an hour, Disp: 1 mL, Rfl: 0   Medications ordered in this encounter:  No orders of the defined types were placed in this encounter.    *If you need refills on other medications prior to your next appointment, please contact your pharmacy*  Follow-Up: Call back or seek an in-person evaluation if the symptoms worsen or if the condition fails to improve as anticipated.  Other Instructions Animal Bite, Adult Animal bites can be mild or serious.  Small bites from house pets normally are mild. Bites from cats, strays, or wild animals can be serious. If a stray or wild animal bites you, you need to get medical help right away. You may also need a shot to prevent rabies infection. What increases the risk? Being near pets you do not know. Being near animals that are eating, sleeping, or caring for their babies. Being outside where small, wild animals move freely. What are the signs or symptoms? Pain. Bleeding. Swelling. Bruising. How is this treated? Treatment may include: Cleaning your wound. Rinsing out (flushing) your wound. This uses saline solution, which is made of salt and water. Putting a bandage on your wound. Closing your wound with stitches (sutures), staples, skin glue, or skin tape (adhesive strips). Antibiotic medicine. You may be given pills, cream, gel, or fluid through an IV. A tetanus shot. Rabies treatment, if the animal could have rabies. Surgery, if there is infection or damage that needs to be fixed. Follow these instructions at home: Medicines Take or apply over-the-counter and prescription medicines only as told by your doctor. If you were prescribed an antibiotic medicine, take or apply it as told by your doctor. Do not stop using it even if your wound gets better. Wound care  Follow instructions from your doctor about how to take care of your wound. Make sure you: Wash your hands with soap and water for at least 20 seconds before and after you change your bandage. If  you cannot use soap and water, use hand sanitizer. Change your bandage. Leave stitches or skin glue in place for at least 2 weeks. Leave tape strips alone unless you are told to take them off. You may trim the edges of the tape strips if they curl up. Check your wound every day for signs of infection. Check for: More redness, swelling, or pain. More fluid or blood. Warmth. Pus or a bad smell. General instructions  Raise (elevate) the  injured area above the level of your heart while you are sitting or lying down. If told, put ice on the injured area. To do this: Put ice in a plastic bag. Place a towel between your skin and the bag. Leave the ice on for 20 minutes, 2-3 times per day. Take off the ice if your skin turns bright red. This is very important. If you cannot feel pain, heat, or cold, you have a greater risk of damage to the area. Keep all follow-up visits. Contact a doctor if: You have more redness, swelling, or pain around your wound. Your wound feels warm to the touch. You have a fever or chills. You have a general feeling of sickness (malaise). You feel like you may vomit. You vomit. You have pain that does not get better. Get help right away if: You have a red streak going away from your wound. You have any of these coming from your wound: Non-clear fluid. More blood. Pus or a bad smell. You have trouble moving your injured area. You lose feeling (have numbness) or feel tingling anywhere on your body. Summary Animal bites can be mild or serious. If a stray or wild animal bites you, you need to get medical help right away. Your doctor will look at the wound and may ask about how the animal bite happened. Treatment may include wound care, antibiotic medicine, a tetanus shot, and rabies treatment. This information is not intended to replace advice given to you by your health care provider. Make sure you discuss any questions you have with your health care provider. Document Revised: 06/11/2021 Document Reviewed: 06/11/2021 Elsevier Patient Education  2023 [employer]    If you have been instructed to have an in-person evaluation today at a local Urgent Care facility, please use the link below. It will take you to a list of all of our available [facility], including address, phone number and hours of operation. Please do not delay care.  [facility]  If you or a family  member do not have a primary care provider, use the link below to schedule a visit and establish care. When you choose a [facility] primary care physician or advanced practice provider, you gain a long-term partner in health. Find a Primary Care Provider  Learn more about [facility]'s in-office and virtual care options:  - Get Care Now

## 2022-01-25 ENCOUNTER — Ambulatory Visit: Payer: Medicare Other | Admitting: Internal Medicine

## 2022-02-08 ENCOUNTER — Other Ambulatory Visit: Payer: Self-pay | Admitting: Internal Medicine

## 2022-02-08 DIAGNOSIS — M25552 Pain in left hip: Secondary | ICD-10-CM

## 2022-02-08 DIAGNOSIS — I10 Essential (primary) hypertension: Secondary | ICD-10-CM

## 2022-02-08 DIAGNOSIS — E785 Hyperlipidemia, unspecified: Secondary | ICD-10-CM

## 2022-02-08 DIAGNOSIS — F259 Schizoaffective disorder, unspecified: Secondary | ICD-10-CM

## 2022-02-15 ENCOUNTER — Ambulatory Visit
Admission: RE | Admit: 2022-02-15 | Discharge: 2022-02-15 | Disposition: A | Discharge status: Home / Self Care | Payer: Medicare Other | Source: Ambulatory Visit | Attending: Internal Medicine | Admitting: Internal Medicine

## 2022-02-15 ENCOUNTER — Ambulatory Visit: Admission: RE | Admit: 2022-02-15 | Payer: Medicare Other | Source: Ambulatory Visit

## 2022-02-15 DIAGNOSIS — E785 Hyperlipidemia, unspecified: Secondary | ICD-10-CM | POA: Insufficient documentation

## 2022-02-15 DIAGNOSIS — F259 Schizoaffective disorder, unspecified: Secondary | ICD-10-CM | POA: Insufficient documentation

## 2022-02-15 DIAGNOSIS — I10 Essential (primary) hypertension: Secondary | ICD-10-CM | POA: Insufficient documentation

## 2022-02-15 DIAGNOSIS — M25552 Pain in left hip: Secondary | ICD-10-CM | POA: Insufficient documentation

## 2022-04-05 ENCOUNTER — Other Ambulatory Visit (HOSPITAL_COMMUNITY): Payer: Self-pay | Admitting: Orthopaedic Surgery

## 2022-04-05 ENCOUNTER — Other Ambulatory Visit: Payer: Self-pay | Admitting: Orthopaedic Surgery

## 2022-04-05 DIAGNOSIS — M25551 Pain in right hip: Secondary | ICD-10-CM

## 2022-05-09 ENCOUNTER — Ambulatory Visit: Payer: Medicare Other | Admitting: Internal Medicine

## 2022-07-13 DIAGNOSIS — M25561 Pain in right knee: Secondary | ICD-10-CM | POA: Diagnosis not present

## 2022-07-13 DIAGNOSIS — M25551 Pain in right hip: Secondary | ICD-10-CM | POA: Diagnosis not present

## 2022-07-13 DIAGNOSIS — M25562 Pain in left knee: Secondary | ICD-10-CM | POA: Diagnosis not present

## 2022-07-19 DIAGNOSIS — M25551 Pain in right hip: Secondary | ICD-10-CM | POA: Diagnosis not present

## 2022-07-27 DIAGNOSIS — M25852 Other specified joint disorders, left hip: Secondary | ICD-10-CM | POA: Diagnosis not present

## 2022-07-27 DIAGNOSIS — M25851 Other specified joint disorders, right hip: Secondary | ICD-10-CM | POA: Diagnosis not present

## 2022-08-01 DIAGNOSIS — E785 Hyperlipidemia, unspecified: Secondary | ICD-10-CM | POA: Diagnosis not present

## 2022-08-01 DIAGNOSIS — Z131 Encounter for screening for diabetes mellitus: Secondary | ICD-10-CM | POA: Diagnosis not present

## 2022-08-01 DIAGNOSIS — I10 Essential (primary) hypertension: Secondary | ICD-10-CM | POA: Diagnosis not present

## 2022-08-01 DIAGNOSIS — F17210 Nicotine dependence, cigarettes, uncomplicated: Secondary | ICD-10-CM | POA: Diagnosis not present

## 2022-08-02 ENCOUNTER — Other Ambulatory Visit: Payer: Self-pay | Admitting: Nurse Practitioner

## 2022-08-02 DIAGNOSIS — F17219 Nicotine dependence, cigarettes, with unspecified nicotine-induced disorders: Secondary | ICD-10-CM

## 2022-08-05 DIAGNOSIS — M545 Low back pain, unspecified: Secondary | ICD-10-CM | POA: Diagnosis not present

## 2022-08-05 DIAGNOSIS — M542 Cervicalgia: Secondary | ICD-10-CM | POA: Diagnosis not present

## 2022-08-05 DIAGNOSIS — M9901 Segmental and somatic dysfunction of cervical region: Secondary | ICD-10-CM | POA: Diagnosis not present

## 2022-08-05 DIAGNOSIS — M9903 Segmental and somatic dysfunction of lumbar region: Secondary | ICD-10-CM | POA: Diagnosis not present

## 2022-09-19 DIAGNOSIS — M25511 Pain in right shoulder: Secondary | ICD-10-CM | POA: Diagnosis not present

## 2022-10-20 DIAGNOSIS — M25551 Pain in right hip: Secondary | ICD-10-CM | POA: Diagnosis not present

## 2022-10-20 DIAGNOSIS — M25511 Pain in right shoulder: Secondary | ICD-10-CM | POA: Diagnosis not present

## 2022-10-20 DIAGNOSIS — M545 Low back pain, unspecified: Secondary | ICD-10-CM | POA: Diagnosis not present

## 2022-10-20 DIAGNOSIS — M791 Myalgia, unspecified site: Secondary | ICD-10-CM | POA: Diagnosis not present

## 2022-10-20 DIAGNOSIS — M25552 Pain in left hip: Secondary | ICD-10-CM | POA: Diagnosis not present

## 2022-10-20 DIAGNOSIS — M542 Cervicalgia: Secondary | ICD-10-CM | POA: Diagnosis not present

## 2022-10-31 DIAGNOSIS — J00 Acute nasopharyngitis [common cold]: Secondary | ICD-10-CM | POA: Diagnosis not present

## 2022-11-25 DIAGNOSIS — M9903 Segmental and somatic dysfunction of lumbar region: Secondary | ICD-10-CM | POA: Diagnosis not present

## 2022-11-25 DIAGNOSIS — M545 Low back pain, unspecified: Secondary | ICD-10-CM | POA: Diagnosis not present

## 2022-11-25 DIAGNOSIS — M542 Cervicalgia: Secondary | ICD-10-CM | POA: Diagnosis not present

## 2022-11-25 DIAGNOSIS — M9901 Segmental and somatic dysfunction of cervical region: Secondary | ICD-10-CM | POA: Diagnosis not present

## 2022-12-02 DIAGNOSIS — H9203 Otalgia, bilateral: Secondary | ICD-10-CM | POA: Diagnosis not present

## 2022-12-02 DIAGNOSIS — L608 Other nail disorders: Secondary | ICD-10-CM | POA: Diagnosis not present

## 2022-12-09 DIAGNOSIS — M9903 Segmental and somatic dysfunction of lumbar region: Secondary | ICD-10-CM | POA: Diagnosis not present

## 2022-12-09 DIAGNOSIS — M542 Cervicalgia: Secondary | ICD-10-CM | POA: Diagnosis not present

## 2022-12-09 DIAGNOSIS — M545 Low back pain, unspecified: Secondary | ICD-10-CM | POA: Diagnosis not present

## 2022-12-09 DIAGNOSIS — M9901 Segmental and somatic dysfunction of cervical region: Secondary | ICD-10-CM | POA: Diagnosis not present

## 2023-01-16 ENCOUNTER — Ambulatory Visit: Payer: 59 | Admitting: Family Medicine

## 2023-01-18 ENCOUNTER — Ambulatory Visit: Payer: 59 | Admitting: Physician Assistant

## 2023-05-04 NOTE — Progress Notes (Signed)
BP (!) 141/96 (BP Location: Left Arm, Patient Position: Sitting, Cuff Size: Large)   Pulse 85   Temp 98.1 F (36.7 C) (Oral)   Ht 5' 7.75" (1.721 m)   Wt 171 lb 6.4 oz (77.7 kg)   SpO2 95%   BMI 26.25 kg/m    Subjective:    Patient ID: David Phillips, male    DOB: 31-Aug-1985, 37 y.o.   MRN: [id number]  HPI: David Phillips is a 37 y.o. male  Chief Complaint  Patient presents with   Establish Care   Arthritis    Neck and lower back, bilateral hips    torn cartilage    In bilateral hips    Patient presents to clinic to establish care with new PCP.  Introduced to Publishing rights manager role and practice setting.  All questions answered.  Discussed provider/patient relationship and expectations.  Patient reports a history of high blood pressure and high cholesterol- he stopped taking the atorvastatin because it made his skin more fragile.  He has schizoeffective disorder and anxiety and taking Latuda.  COPD- he is still smoking 1.5ppd.  He is working to cut back.  Patient was being seen at [facility] in [place] by Dr. Mikeal Hawthorne.  He decided to come here due to living in [place].    Patient denies a history of: Diabetes, Thyroid problems, Neurological problems, and Abdominal problems.   HYPERTENSION without Chronic Kidney Disease Hypertension status: controlled  Satisfied with current treatment? no Duration of hypertension: years BP monitoring frequency:  not checking BP range:  BP medication side effects:  no Medication compliance: excellent compliance Previous BP meds:losartan (cozaar) Aspirin: no Recurrent headaches: no Visual changes: no Palpitations: no Dyspnea: no Chest pain: no Lower extremity edema: no Dizzy/lightheaded: no   ANXIETY/MOOD Patient states he struggles with anxiety.  He was taken off the hydroxyzine.  He was taking Hydroxyzine TID to help him with anxiety and sleeping.  Feels like his mood is well controlled with the Latuda and would like to  continue with that medication.    Patient has chronic pain.  He is planning to have hip surgery on both hips.  States his elbows and knees pop.  Not having any swelling.  He does have stiffness in his hands.  He states he has trouble sleeping so he gets tired.    He states he struggles with his memory some.    Active Ambulatory Problems    Diagnosis Date Noted   Essential hypertension 02/23/2017   Allergic rhinitis 03/23/2017   Chronic left hip pain 02/12/2018   Chronic left-sided low back pain without sciatica 02/12/2018   Neutrophilia 02/12/2018   Schizoaffective disorder, bipolar type (HCC) 02/12/2018   Encounter for tobacco use cessation counseling 02/12/2018   Overweight (BMI 25.0-29.9) 02/12/2018   Hyperlipidemia 03/19/2018   Chest pain 05/29/2018   Family history of stomach cancer 04/27/2020   COPD suggested by initial evaluation (HCC) 07/23/2020   Gastroesophageal reflux disease 07/23/2020   Family history of cancer    Resolved Ambulatory Problems    Diagnosis Date Noted   No Resolved Ambulatory Problems   Past Medical History:  Diagnosis Date   Gastric ulcer    Schizoaffective disorder (HCC)    History reviewed. No pertinent surgical history. Family History  Problem Relation Age of Onset   Diabetes Mother    Diabetes Father    Heart attack Father 38       Bypass surgeries; 3 MI's   Schizophrenia Other  Heart attack Paternal Grandfather 28   Stomach cancer Maternal Grandfather    Other Neg Hx      Review of Systems  Eyes:  Negative for visual disturbance.  Respiratory:  Negative for shortness of breath.   Cardiovascular:  Negative for chest pain and leg swelling.  Neurological:  Negative for light-headedness and headaches.  Psychiatric/Behavioral:  The patient is nervous/anxious.     Per HPI unless specifically indicated above     Objective:    BP (!) 141/96 (BP Location: Left Arm, Patient Position: Sitting, Cuff Size: Large)   Pulse 85   Temp  98.1 F (36.7 C) (Oral)   Ht 5' 7.75" (1.721 m)   Wt 171 lb 6.4 oz (77.7 kg)   SpO2 95%   BMI 26.25 kg/m   Wt Readings from Last 3 Encounters:  05/05/23 171 lb 6.4 oz (77.7 kg)  06/30/21 173 lb (78.5 kg)  03/15/21 173 lb 4.8 oz (78.6 kg)    Physical Exam Vitals and nursing note reviewed.  Constitutional:      General: He is not in acute distress.    Appearance: Normal appearance. He is not ill-appearing, toxic-appearing or diaphoretic.  HENT:     Head: Normocephalic.     Right Ear: External ear normal.     Left Ear: External ear normal.     Nose: Nose normal. No congestion or rhinorrhea.     Mouth/Throat:     Mouth: Mucous membranes are moist.  Eyes:     General:        Right eye: No discharge.        Left eye: No discharge.     Extraocular Movements: Extraocular movements intact.     Conjunctiva/sclera: Conjunctivae normal.     Pupils: Pupils are equal, round, and reactive to light.  Cardiovascular:     Rate and Rhythm: Normal rate and regular rhythm.     Heart sounds: No murmur heard. Pulmonary:     Effort: Pulmonary effort is normal. No respiratory distress.     Breath sounds: Normal breath sounds. No wheezing, rhonchi or rales.  Abdominal:     General: Abdomen is flat. Bowel sounds are normal.  Musculoskeletal:     Cervical back: Normal range of motion and neck supple.  Skin:    General: Skin is warm and dry.     Capillary Refill: Capillary refill takes less than 2 seconds.  Neurological:     General: No focal deficit present.     Mental Status: He is alert and oriented to person, place, and time.  Psychiatric:        Mood and Affect: Mood normal.        Behavior: Behavior normal.        Thought Content: Thought content normal.        Judgment: Judgment normal.      Assessment & Plan:   Problem List Items Addressed This Visit       Cardiovascular and Mediastinum   Essential hypertension    Chronic. Elevated at visit today.  Will refill Losartan 100mg   today, if blood pressure remains elevated, will adjust medications at next visit.  Follow up in 2 months.       Relevant Medications   losartan (COZAAR) 100 MG tablet     Other   Chronic left hip pain    Chronic condition.  Currently on Norco 10/325.  Discussed with patient during visit that he will need to find alternative solution to obtaining  pain medication, we do not prescribe long term opiates at this office.  Patient plans to continue to follow up with Dr. Mikeal Hawthorne for pain management only.  Discussed with patient that he will not be able to be see at both clinics for routine maintenance of other health concerns.        Relevant Medications   HYDROcodone-acetaminophen (NORCO) 10-325 MG tablet   Schizoaffective disorder, bipolar type (HCC) - Primary    Controlled with Latuda.  Would like to continue with medication- refilled today. Endorses ongoing anxiety.  Previously on hydroxyzine which helped him with anxiety and helped him sleep.  Discussed risk of other anti anxiety medications with taking long term opiates.  Will refill hydroxyzine at visit today.        Relevant Medications   Lurasidone HCl (LATUDA) 120 MG TABS   Hyperlipidemia    Chronic.  No longer taking Atorvastatin.  Declines wanting to restart medication.       Relevant Medications   losartan (COZAAR) 100 MG tablet   COPD suggested by initial evaluation (HCC)    Controlled without medication. Refilled Albuterol for PRN use.      Relevant Medications   albuterol (VENTOLIN HFA) 108 (90 Base) MCG/ACT inhaler   Other Visit Diagnoses     Stiffness of hand joint, unspecified laterality       Patient concerned about RA.  Will check labs and treat accordingly.   Relevant Orders   Rheumatoid factor   ANA   ANA+ENA+DNA/DS+Antich+Centr   CK   Uric acid   Comprehensive metabolic panel   CBC with Differential/Platelet        Follow up plan: Return in about 2 months (around 07/05/2023) for Physical and Fasting labs,  [facility].

## 2023-05-05 ENCOUNTER — Ambulatory Visit (INDEPENDENT_AMBULATORY_CARE_PROVIDER_SITE_OTHER): Payer: 59 | Admitting: Nurse Practitioner

## 2023-05-05 ENCOUNTER — Encounter: Payer: Self-pay | Admitting: Nurse Practitioner

## 2023-05-05 VITALS — BP 141/96 | HR 85 | Temp 98.1°F | Ht 67.75 in | Wt 171.4 lb

## 2023-05-05 DIAGNOSIS — F250 Schizoaffective disorder, bipolar type: Secondary | ICD-10-CM

## 2023-05-05 DIAGNOSIS — M25552 Pain in left hip: Secondary | ICD-10-CM

## 2023-05-05 DIAGNOSIS — M25649 Stiffness of unspecified hand, not elsewhere classified: Secondary | ICD-10-CM | POA: Diagnosis not present

## 2023-05-05 DIAGNOSIS — I10 Essential (primary) hypertension: Secondary | ICD-10-CM

## 2023-05-05 DIAGNOSIS — J449 Chronic obstructive pulmonary disease, unspecified: Secondary | ICD-10-CM | POA: Diagnosis not present

## 2023-05-05 DIAGNOSIS — E782 Mixed hyperlipidemia: Secondary | ICD-10-CM

## 2023-05-05 DIAGNOSIS — G8929 Other chronic pain: Secondary | ICD-10-CM

## 2023-05-05 MED ORDER — LURASIDONE HCL 120 MG PO TABS: 120.0000 mg | ORAL_TABLET | Freq: Every day | ORAL | 1 refills | Status: DC

## 2023-05-05 MED ORDER — LOSARTAN POTASSIUM 100 MG PO TABS: 100.0000 mg | ORAL_TABLET | Freq: Every day | ORAL | 1 refills | Status: AC

## 2023-05-05 MED ORDER — ALBUTEROL SULFATE HFA 108 (90 BASE) MCG/ACT IN AERS: 2.0000 | INHALATION_SPRAY | RESPIRATORY_TRACT | 1 refills | Status: AC | PRN

## 2023-05-05 MED ORDER — HYDROXYZINE HCL 50 MG PO TABS: 50.0000 mg | ORAL_TABLET | Freq: Three times a day (TID) | ORAL | 3 refills | Status: AC | PRN

## 2023-05-05 NOTE — Assessment & Plan Note (Signed)
Controlled without medication. Refilled Albuterol for PRN use.

## 2023-05-05 NOTE — Assessment & Plan Note (Signed)
Controlled with Latuda.  Would like to continue with medication- refilled today. Endorses ongoing anxiety.  Previously on hydroxyzine which helped him with anxiety and helped him sleep.  Discussed risk of other anti anxiety medications with taking long term opiates.  Will refill hydroxyzine at visit today.

## 2023-05-05 NOTE — Assessment & Plan Note (Signed)
Chronic.  No longer taking Atorvastatin.  Declines wanting to restart medication.

## 2023-05-05 NOTE — Assessment & Plan Note (Signed)
Chronic condition.  Currently on Norco 10/325.  Discussed with patient during visit that he will need to find alternative solution to obtaining pain medication, we do not prescribe long term opiates at this office.  Patient plans to continue to follow up with Dr. Mikeal Hawthorne for pain management only.  Discussed with patient that he will not be able to be see at both clinics for routine maintenance of other health concerns.

## 2023-05-05 NOTE — Assessment & Plan Note (Signed)
Chronic. Elevated at visit today.  Will refill Losartan 100mg  today, if blood pressure remains elevated, will adjust medications at next visit.  Follow up in 2 months.

## 2023-05-09 LAB — COMPREHENSIVE METABOLIC PANEL
ALT: 32 IU/L (ref 0–44)
AST: 27 IU/L (ref 0–40)
Albumin: 4.9 g/dL (ref 4.1–5.1)
Alkaline Phosphatase: 77 IU/L (ref 44–121)
BUN/Creatinine Ratio: 9 (ref 9–20)
BUN: 9 mg/dL (ref 6–20)
Bilirubin Total: 0.3 mg/dL (ref 0.0–1.2)
CO2: 22 mmol/L (ref 20–29)
Calcium: 9.7 mg/dL (ref 8.7–10.2)
Chloride: 100 mmol/L (ref 96–106)
Creatinine, Ser: 1.01 mg/dL (ref 0.76–1.27)
Globulin, Total: 2.1 g/dL (ref 1.5–4.5)
Glucose: 105 mg/dL — ABNORMAL HIGH (ref 70–99)
Potassium: 4.3 mmol/L (ref 3.5–5.2)
Sodium: 139 mmol/L (ref 134–144)
Total Protein: 7 g/dL (ref 6.0–8.5)
eGFR: 98 mL/min/{1.73_m2} (ref >59)

## 2023-05-09 LAB — CBC WITH DIFFERENTIAL/PLATELET
Basophils Absolute: 0.1 10*3/uL (ref 0.0–0.2)
Basos: 1 %
EOS (ABSOLUTE): 0.5 10*3/uL — ABNORMAL HIGH (ref 0.0–0.4)
Eos: 5 %
Hematocrit: 48.6 % (ref 37.5–51.0)
Hemoglobin: 17.1 g/dL (ref 13.0–17.7)
Immature Grans (Abs): 0 10*3/uL (ref 0.0–0.1)
Immature Granulocytes: 0 %
Lymphocytes Absolute: 2.7 10*3/uL (ref 0.7–3.1)
Lymphs: 28 %
MCH: 34.9 pg — ABNORMAL HIGH (ref 26.6–33.0)
MCHC: 35.2 g/dL (ref 31.5–35.7)
MCV: 99 fL — ABNORMAL HIGH (ref 79–97)
Monocytes Absolute: 0.7 10*3/uL (ref 0.1–0.9)
Monocytes: 7 %
Neutrophils Absolute: 5.4 10*3/uL (ref 1.4–7.0)
Neutrophils: 59 %
Platelets: 309 10*3/uL (ref 150–450)
RBC: 4.9 x10E6/uL (ref 4.14–5.80)
RDW: 13 % (ref 11.6–15.4)
WBC: 9.4 10*3/uL (ref 3.4–10.8)

## 2023-05-09 LAB — ANA+ENA+DNA/DS+ANTICH+CENTR
ANA Titer 1: NEGATIVE
Anti JO-1: <0.2 AI (ref 0.0–0.9)
Centromere Ab Screen: <0.2 AI (ref 0.0–0.9)
Chromatin Ab SerPl-aCnc: <0.2 AI (ref 0.0–0.9)
ENA RNP Ab: <0.2 AI (ref 0.0–0.9)
ENA SM Ab Ser-aCnc: <0.2 AI (ref 0.0–0.9)
ENA SSA (RO) Ab: 0.2 AI (ref 0.0–0.9)
ENA SSB (LA) Ab: <0.2 AI (ref 0.0–0.9)
Scleroderma (Scl-70) (ENA) Antibody, IgG: <0.2 AI (ref 0.0–0.9)
dsDNA Ab: <1 [IU]/mL (ref 0–9)

## 2023-05-09 LAB — URIC ACID: Uric Acid: 4.5 mg/dL (ref 3.8–8.4)

## 2023-05-09 LAB — RHEUMATOID FACTOR: Rheumatoid fact SerPl-aCnc: <10 [IU]/mL (ref <14.0)

## 2023-05-09 LAB — ANA: Anti Nuclear Antibody (ANA): NEGATIVE

## 2023-05-09 LAB — CK: Total CK: 191 U/L (ref 49–439)

## 2023-05-24 ENCOUNTER — Encounter: Payer: Self-pay | Admitting: Nurse Practitioner

## 2023-05-24 ENCOUNTER — Ambulatory Visit (INDEPENDENT_AMBULATORY_CARE_PROVIDER_SITE_OTHER): Payer: 59 | Admitting: Nurse Practitioner

## 2023-05-24 VITALS — BP 132/86 | HR 71 | Temp 98.2°F | Ht 67.75 in | Wt 172.6 lb

## 2023-05-24 DIAGNOSIS — F250 Schizoaffective disorder, bipolar type: Secondary | ICD-10-CM

## 2023-05-24 MED ORDER — LURASIDONE HCL 120 MG PO TABS: 120.0000 mg | ORAL_TABLET | Freq: Every day | ORAL | 1 refills | Status: AC

## 2023-05-24 NOTE — Progress Notes (Unsigned)
BP 132/86 (BP Location: Left Arm, Patient Position: Sitting, Cuff Size: Normal)   Pulse 71   Temp 98.2 F (36.8 C) (Oral)   Ht 5' 7.75" (1.721 m)   Wt 172 lb 9.6 oz (78.3 kg)   SpO2 97%   BMI 26.44 kg/m    Subjective:    Patient ID: David Phillips, male    DOB: 1986-02-10, 37 y.o.   MRN: [id number]  HPI: David Phillips is a 37 y.o. male  Chief Complaint  Patient presents with   Medication Management    Update on Alternative to Latuda, Can't take anything other than Latuda    wellness visit   Patient presents to clinic today to discuss his latuda.  Patient needs to take the brand name of latuda.  Has tried other options and they did not work well for him.  Patient is concerned the previously prescription that was sent a generic.  However, it stated in the notes to dispense the brand name.   Relevant past medical, surgical, family and social history reviewed and updated as indicated. Interim medical history since our last visit reviewed. Allergies and medications reviewed and updated.  Review of Systems  All other systems reviewed and are negative.   Per HPI unless specifically indicated above     Objective:    BP 132/86 (BP Location: Left Arm, Patient Position: Sitting, Cuff Size: Normal)   Pulse 71   Temp 98.2 F (36.8 C) (Oral)   Ht 5' 7.75" (1.721 m)   Wt 172 lb 9.6 oz (78.3 kg)   SpO2 97%   BMI 26.44 kg/m   Wt Readings from Last 3 Encounters:  05/24/23 172 lb 9.6 oz (78.3 kg)  05/05/23 171 lb 6.4 oz (77.7 kg)  06/30/21 173 lb (78.5 kg)    Physical Exam Vitals and nursing note reviewed.  Constitutional:      General: He is not in acute distress.    Appearance: Normal appearance. He is not ill-appearing, toxic-appearing or diaphoretic.  HENT:     Head: Normocephalic.     Right Ear: External ear normal.     Left Ear: External ear normal.     Nose: Nose normal. No congestion or rhinorrhea.     Mouth/Throat:     Mouth: Mucous membranes are  moist.  Eyes:     General:        Right eye: No discharge.        Left eye: No discharge.     Extraocular Movements: Extraocular movements intact.     Conjunctiva/sclera: Conjunctivae normal.     Pupils: Pupils are equal, round, and reactive to light.  Cardiovascular:     Rate and Rhythm: Normal rate and regular rhythm.     Heart sounds: No murmur heard. Pulmonary:     Effort: Pulmonary effort is normal. No respiratory distress.     Breath sounds: Normal breath sounds. No wheezing, rhonchi or rales.  Abdominal:     General: Abdomen is flat. Bowel sounds are normal.  Musculoskeletal:     Cervical back: Normal range of motion and neck supple.  Skin:    General: Skin is warm and dry.     Capillary Refill: Capillary refill takes less than 2 seconds.  Neurological:     General: No focal deficit present.     Mental Status: He is alert and oriented to person, place, and time.  Psychiatric:        Mood and Affect: Mood normal.  Behavior: Behavior normal.        Thought Content: Thought content normal.        Judgment: Judgment normal.     Results for orders placed or performed in visit on 05/05/23  Rheumatoid factor  Result Value Ref Range   Rheumatoid fact SerPl-aCnc <10.0 <14.0 IU/mL  ANA  Result Value Ref Range   Anti Nuclear Antibody (ANA) Negative Negative  ANA+ENA+DNA/DS+Antich+Centr  Result Value Ref Range   ANA Titer 1 Negative    dsDNA Ab <1 0 - 9 IU/mL   ENA RNP Ab <0.2 0.0 - 0.9 AI   ENA SM Ab Ser-aCnc <0.2 0.0 - 0.9 AI   Scleroderma (Scl-70) (ENA) Antibody, IgG <0.2 0.0 - 0.9 AI   ENA SSA (RO) Ab 0.2 0.0 - 0.9 AI   ENA SSB (LA) Ab <0.2 0.0 - 0.9 AI   Chromatin Ab SerPl-aCnc <0.2 0.0 - 0.9 AI   Anti JO-1 <0.2 0.0 - 0.9 AI   Centromere Ab Screen <0.2 0.0 - 0.9 AI   See below: Comment   CK  Result Value Ref Range   Total CK 191 49 - 439 U/L  Uric acid  Result Value Ref Range   Uric Acid 4.5 3.8 - 8.4 mg/dL  Comprehensive metabolic panel  Result Value  Ref Range   Glucose 105 (H) 70 - 99 mg/dL   BUN 9 6 - 20 mg/dL   Creatinine, Ser 1.61 0.76 - 1.27 mg/dL   eGFR 98 >09 UE/AVW/0.98   BUN/Creatinine Ratio 9 9 - 20   Sodium 139 134 - 144 mmol/L   Potassium 4.3 3.5 - 5.2 mmol/L   Chloride 100 96 - 106 mmol/L   CO2 22 20 - 29 mmol/L   Calcium 9.7 8.7 - 10.2 mg/dL   Total Protein 7.0 6.0 - 8.5 g/dL   Albumin 4.9 4.1 - 5.1 g/dL   Globulin, Total 2.1 1.5 - 4.5 g/dL   Bilirubin Total 0.3 0.0 - 1.2 mg/dL   Alkaline Phosphatase 77 44 - 121 IU/L   AST 27 0 - 40 IU/L   ALT 32 0 - 44 IU/L  CBC with Differential/Platelet  Result Value Ref Range   WBC 9.4 3.4 - 10.8 x10E3/uL   RBC 4.90 4.14 - 5.80 x10E6/uL   Hemoglobin 17.1 13.0 - 17.7 g/dL   Hematocrit 11.9 14.7 - 51.0 %   MCV 99 (H) 79 - 97 fL   MCH 34.9 (H) 26.6 - 33.0 pg   MCHC 35.2 31.5 - 35.7 g/dL   RDW 82.9 56.2 - 13.0 %   Platelets 309 150 - 450 x10E3/uL   Neutrophils 59 Not Estab. %   Lymphs 28 Not Estab. %   Monocytes 7 Not Estab. %   Eos 5 Not Estab. %   Basos 1 Not Estab. %   Neutrophils Absolute 5.4 1.4 - 7.0 x10E3/uL   Lymphocytes Absolute 2.7 0.7 - 3.1 x10E3/uL   Monocytes Absolute 0.7 0.1 - 0.9 x10E3/uL   EOS (ABSOLUTE) 0.5 (H) 0.0 - 0.4 x10E3/uL   Basophils Absolute 0.1 0.0 - 0.2 x10E3/uL   Immature Granulocytes 0 Not Estab. %   Immature Grans (Abs) 0.0 0.0 - 0.1 x10E3/uL      Assessment & Plan:   Problem List Items Addressed This Visit       Other   Schizoaffective disorder, bipolar type (HCC) - Primary    Chronic.  Does well the with brand name.  Extensive conversation had with patient explaining how a  DAW prescription is written.  Called the pharmacy while patient was in the office to verify brand name would be dispensed.        Follow up plan: No follow-ups on file.

## 2023-05-25 NOTE — Assessment & Plan Note (Signed)
Chronic.  Does well the with brand name.  Extensive conversation had with patient explaining how a DAW prescription is written.  Called the pharmacy while patient was in the office to verify brand name would be dispensed.

## 2023-07-05 ENCOUNTER — Ambulatory Visit: Payer: 59 | Admitting: Nurse Practitioner

## 2023-11-01 ENCOUNTER — Encounter: Payer: Self-pay | Admitting: Nurse Practitioner

## 2023-11-01 ENCOUNTER — Ambulatory Visit: Admitting: Nurse Practitioner

## 2023-11-01 VITALS — BP 112/68 | HR 76 | Temp 98.6°F | Resp 15 | Ht 67.76 in | Wt 173.8 lb

## 2023-11-01 DIAGNOSIS — Z113 Encounter for screening for infections with a predominantly sexual mode of transmission: Secondary | ICD-10-CM

## 2023-11-01 DIAGNOSIS — S90221A Contusion of right lesser toe(s) with damage to nail, initial encounter: Secondary | ICD-10-CM

## 2023-11-01 NOTE — Progress Notes (Signed)
 BP 112/68 (BP Location: Right Arm, Patient Position: Sitting, Cuff Size: Normal)   Pulse 76   Temp 98.6 F (37 C) (Oral)   Resp 15   Ht 5' 7.76" (1.721 m)   Wt 173 lb 12.8 oz (78.8 kg)   SpO2 98%   BMI 26.62 kg/m    Subjective:    Patient ID: David Phillips, male    DOB: May 02, 1986, 38 y.o.   MRN: [id number]  HPI: David Phillips is a 38 y.o. male  Chief Complaint  Patient presents with   Nail Problem    Black line started in the last month. Concerned for possible reasoning based on google search.    STI Testing    Possible herpes and is concerned based on previous interaction and would like to be tested.    STD SCREENING Sexual activity:  Recent unprotected sexual encounter Contraception: no Recent unprotected intercourse: condom broke during encounter History of sexually transmitted diseases: no Previous sexually transmitted disease screening: no Lifetime sexual partners:  Genital lesions: no Penile discharge: no Dysuria: no Swollen lymph nodes: no Fevers: no Rash: no   TOE CONCERN Patient states he has a black line forming on the inside half of his right great toe.  No discharge or pain to the area.  No injury to the foot.  He noticed over the past month.    Relevant past medical, surgical, family and social history reviewed and updated as indicated. Interim medical history since our last visit reviewed. Allergies and medications reviewed and updated.  Review of Systems  Skin:        Black on right great toe    Per HPI unless specifically indicated above     Objective:     BP 112/68 (BP Location: Right Arm, Patient Position: Sitting, Cuff Size: Normal)   Pulse 76   Temp 98.6 F (37 C) (Oral)   Resp 15   Ht 5' 7.76" (1.721 m)   Wt 173 lb 12.8 oz (78.8 kg)   SpO2 98%   BMI 26.62 kg/m   Wt Readings from Last 3 Encounters:  11/01/23 173 lb 12.8 oz (78.8 kg)  05/24/23 172 lb 9.6 oz (78.3 kg)  05/05/23 171 lb 6.4 oz (77.7 kg)     Physical Exam Vitals and nursing note reviewed.  Constitutional:      General: He is not in acute distress.    Appearance: Normal appearance. He is not ill-appearing, toxic-appearing or diaphoretic.  HENT:     Head: Normocephalic.     Right Ear: External ear normal.     Left Ear: External ear normal.     Nose: Nose normal. No congestion or rhinorrhea.     Mouth/Throat:     Mouth: Mucous membranes are moist.  Eyes:     General:        Right eye: No discharge.        Left eye: No discharge.     Extraocular Movements: Extraocular movements intact.     Conjunctiva/sclera: Conjunctivae normal.     Pupils: Pupils are equal, round, and reactive to light.  Cardiovascular:     Rate and Rhythm: Normal rate and regular rhythm.     Heart sounds: No murmur heard. Pulmonary:     Effort: Pulmonary effort is normal. No respiratory distress.     Breath sounds: Normal breath sounds. No wheezing, rhonchi or rales.  Abdominal:     General: Abdomen is flat. Bowel sounds are normal.  Musculoskeletal:  Cervical back: Normal range of motion and neck supple.       Feet:  Skin:    General: Skin is warm and dry.     Capillary Refill: Capillary refill takes less than 2 seconds.  Neurological:     General: No focal deficit present.     Mental Status: He is alert and oriented to person, place, and time.  Psychiatric:        Mood and Affect: Mood normal.        Behavior: Behavior normal.        Thought Content: Thought content normal.        Judgment: Judgment normal.     Results for orders placed or performed in visit on 05/05/23  Rheumatoid factor   Collection Time: 05/05/23 11:32 AM  Result Value Ref Range   Rheumatoid fact SerPl-aCnc <10.0 <14.0 IU/mL  ANA   Collection Time: 05/05/23 11:32 AM  Result Value Ref Range   Anti Nuclear Antibody (ANA) Negative Negative  ANA+ENA+DNA/DS+Antich+Centr   Collection Time: 05/05/23 11:32 AM  Result Value Ref Range   ANA Titer 1 Negative     dsDNA Ab <1 0 - 9 IU/mL   ENA RNP Ab <0.2 0.0 - 0.9 AI   ENA SM Ab Ser-aCnc <0.2 0.0 - 0.9 AI   Scleroderma (Scl-70) (ENA) Antibody, IgG <0.2 0.0 - 0.9 AI   ENA SSA (RO) Ab 0.2 0.0 - 0.9 AI   ENA SSB (LA) Ab <0.2 0.0 - 0.9 AI   Chromatin Ab SerPl-aCnc <0.2 0.0 - 0.9 AI   Anti JO-1 <0.2 0.0 - 0.9 AI   Centromere Ab Screen <0.2 0.0 - 0.9 AI   See below: Comment   CK   Collection Time: 05/05/23 11:32 AM  Result Value Ref Range   Total CK 191 49 - 439 U/L  Uric acid   Collection Time: 05/05/23 11:32 AM  Result Value Ref Range   Uric Acid 4.5 3.8 - 8.4 mg/dL  Comprehensive metabolic panel   Collection Time: 05/05/23 11:32 AM  Result Value Ref Range   Glucose 105 (H) 70 - 99 mg/dL   BUN 9 6 - 20 mg/dL   Creatinine, Ser 1.61 0.76 - 1.27 mg/dL   eGFR 98 >09 UE/AVW/0.98   BUN/Creatinine Ratio 9 9 - 20   Sodium 139 134 - 144 mmol/L   Potassium 4.3 3.5 - 5.2 mmol/L   Chloride 100 96 - 106 mmol/L   CO2 22 20 - 29 mmol/L   Calcium  9.7 8.7 - 10.2 mg/dL   Total Protein 7.0 6.0 - 8.5 g/dL   Albumin 4.9 4.1 - 5.1 g/dL   Globulin, Total 2.1 1.5 - 4.5 g/dL   Bilirubin Total 0.3 0.0 - 1.2 mg/dL   Alkaline Phosphatase 77 44 - 121 IU/L   AST 27 0 - 40 IU/L   ALT 32 0 - 44 IU/L  CBC with Differential/Platelet   Collection Time: 05/05/23 11:32 AM  Result Value Ref Range   WBC 9.4 3.4 - 10.8 x10E3/uL   RBC 4.90 4.14 - 5.80 x10E6/uL   Hemoglobin 17.1 13.0 - 17.7 g/dL   Hematocrit 11.9 14.7 - 51.0 %   MCV 99 (H) 79 - 97 fL   MCH 34.9 (H) 26.6 - 33.0 pg   MCHC 35.2 31.5 - 35.7 g/dL   RDW 82.9 56.2 - 13.0 %   Platelets 309 150 - 450 x10E3/uL   Neutrophils 59 Not Estab. %   Lymphs 28 Not Estab. %  Monocytes 7 Not Estab. %   Eos 5 Not Estab. %   Basos 1 Not Estab. %   Neutrophils Absolute 5.4 1.4 - 7.0 x10E3/uL   Lymphocytes Absolute 2.7 0.7 - 3.1 x10E3/uL   Monocytes Absolute 0.7 0.1 - 0.9 x10E3/uL   EOS (ABSOLUTE) 0.5 (H) 0.0 - 0.4 x10E3/uL   Basophils Absolute 0.1 0.0 - 0.2 x10E3/uL    Immature Granulocytes 0 Not Estab. %   Immature Grans (Abs) 0.0 0.0 - 0.1 x10E3/uL      Assessment & Plan:   Problem List Items Addressed This Visit   None Visit Diagnoses       Contusion of toenail of right foot, initial encounter    -  Primary   Referral placed for patient to see Podiatry for further evaluation and management.   Relevant Orders   Ambulatory referral to Podiatry     Screening examination for STI       Labs ordered. Will make recommendations based on lab results.   Relevant Orders   HIV Antibody (routine testing w rflx)   RPR   Hepatitis C antibody   Chlamydia/Gonococcus/Trichomonas, NAA([facility])   HSV 1 and 2 Ab, IgG        Follow up plan: No follow-ups on file.

## 2023-11-02 ENCOUNTER — Ambulatory Visit: Payer: Self-pay | Admitting: Nurse Practitioner

## 2023-11-02 LAB — HEPATITIS C ANTIBODY: Hep C Virus Ab: NONREACTIVE

## 2023-11-02 LAB — RPR: RPR Ser Ql: NONREACTIVE

## 2023-11-02 LAB — HIV ANTIBODY (ROUTINE TESTING W REFLEX): HIV Screen 4th Generation wRfx: NONREACTIVE

## 2023-11-03 LAB — HSV 1 AND 2 AB, IGG
HSV 1 Glycoprotein G Ab, IgG: NONREACTIVE
HSV 2 IgG, Type Spec: NONREACTIVE

## 2023-11-05 LAB — CHLAMYDIA/GONOCOCCUS/TRICHOMONAS, NAA
Chlamydia by NAA: NEGATIVE
Gonococcus by NAA: NEGATIVE
Trich vag by NAA: NEGATIVE

## 2023-11-10 ENCOUNTER — Encounter: Payer: Self-pay | Admitting: Nurse Practitioner

## 2023-11-10 ENCOUNTER — Ambulatory Visit (INDEPENDENT_AMBULATORY_CARE_PROVIDER_SITE_OTHER): Admitting: Nurse Practitioner

## 2023-11-10 VITALS — BP 113/71 | HR 64 | Temp 98.0°F | Wt 171.8 lb

## 2023-11-10 DIAGNOSIS — Z22338 Carrier of other streptococcus: Secondary | ICD-10-CM | POA: Insufficient documentation

## 2023-11-10 DIAGNOSIS — R238 Other skin changes: Secondary | ICD-10-CM | POA: Insufficient documentation

## 2023-11-10 MED ORDER — CEPHALEXIN 500 MG PO CAPS: 500.0000 mg | ORAL_CAPSULE | Freq: Three times a day (TID) | ORAL | 0 refills | Status: AC

## 2023-11-10 MED ORDER — MUPIROCIN 2 % EX OINT: 1.0000 | TOPICAL_OINTMENT | Freq: Two times a day (BID) | CUTANEOUS | 0 refills | Status: AC

## 2023-11-10 NOTE — Assessment & Plan Note (Signed)
 Ongoing for one month with more presenting per patient.  ?MRSA or impetigo.  Currently the blisters are crusted over.  Will start Keflex  500 MG TID and Mupirocin  ointment. Recommend he not pop blisters when present, allow them to pop or dry on their own.  Can place betadine over them to help them dry and crust over. Educated him on skin infections.  Is to return to office if ongoing.

## 2023-11-10 NOTE — Patient Instructions (Signed)
Impetigo, Adult Impetigo is an infection of the skin. It commonly occurs in young children, but it can also occur in adults. The infection causes itchy blisters and sores that produce brownish-yellow fluid. As the fluid dries, it forms a thick, honey-colored crust. These skin changes usually occur on the face, but they can also affect other areas of the body. Impetigo usually goes away in 7-10 days with treatment. What are the causes? This condition is caused by two types of bacteria. It may be caused by staphylococci or streptococci bacteria. These bacteria cause impetigo when they get under the surface of the skin. This often happens after some damage to the skin, such as: Cuts, scrapes, or scratches. Rashes. Insect bites, especially when you scratch the area of a bite. Chickenpox or other illnesses that cause open skin sores. Nail biting or chewing. Impetigo can spread easily from one person to another (is contagious). It may be spread through close skin contact or by sharing towels, clothing, or other items that an infected person has touched. Scratching the affected area can cause impetigo to spread to other parts of the body. The bacteria can get under your fingernails and spread when you touch another area of your skin. What increases the risk? The following factors may make you more likely to develop this condition: Playing sports that include skin-to-skin contact with others. Having broken skin, such as from a cut or scrape. Living in an area that has high humidity levels. Having poor hygiene. Having high levels of staphylococci in your nose. Having a condition that weakens the skin integrity, such as: Having a weak body defense system (immune system). Having a skin condition with open sores, such as chickenpox. Having diabetes. What are the signs or symptoms? The main symptom of this condition is small blisters, often on the face around the mouth and nose. In time, the blisters break  open and turn into tiny sores (lesions) with a yellow crust. In some cases, the blisters cause itching or burning. Scratching, irritation, or lack of treatment may cause these small lesions to get larger. Other possible symptoms include: Larger blisters. Pus. Swollen lymph glands. How is this diagnosed? This condition is usually diagnosed during a physical exam. A skin sample or a sample of fluid from a blister may be taken for lab tests that involve growing bacteria (culture test). Lab tests can help confirm the diagnosis or help determine the best treatment. How is this treated? Treatment for this condition depends on the severity of the condition: Mild impetigo can be treated with prescription antibiotic cream. Oral antibiotic medicine may be used in more severe cases. Medicines that reduce itchiness (antihistamines)may also be used. Follow these instructions at home: Medicines Take over-the-counter and prescription medicines only as told by your health care provider. Apply or take your antibiotic as told by your health care provider. Do not stop using the antibiotic even if your condition improves. Before applying antibiotic cream or ointment, you should: Gently wash the infected areas with antibacterial soap and warm water. Soak crusted areas in warm, soapy water using antibacterial soap. Gently rub the areas to remove crusts. Do not scrub. Preventing the spread of infection  To help prevent impetigo from spreading to other body areas: Keep your fingernails short and clean. Do not scratch the blisters or sores. Cover infected areas, if necessary, to keep from scratching. Wash your hands often with soap and warm water. To help prevent impetigo from spreading to other people: Do not share towels.   Wash your clothing and bedsheets in water that is 140F (60C) or warmer. Stay home until you have used an antibiotic cream for 48 hours (2 days) or an oral antibiotic medicine for 24 hours  (1 day). You should only return to work and activities with other people if your skin shows significant improvement. You may return to contact sports after you have used antibiotic medicine for 72 hours (3 days). General instructions Keep all follow-up visits. This is important. How is this prevented? Wash your hands often with soap and warm water. Do not share towels, washcloths, clothing, bedding, or razors. Keep your fingernails short. Keep any cuts, scrapes, bug bites, or rashes clean and covered. Use insect repellent to prevent bug bites. Contact a health care provider if: You develop more blisters or sores, even with treatment. Other family members get sores. Your skin sores are not improving after 72 hours (3 days) of treatment. You have a fever. Get help right away if: You see spreading redness or swelling of the skin around your sores. You develop a sore throat. The area around your rash becomes warm, red, or tender to the touch. You have dark, reddish-brown urine. You do not urinate often or you urinate small amounts. You are very tired (lethargic). You have swelling in your face, hands, or feet. Summary Impetigo is a skin infection that causes itchy blisters and sores that produce brownish-yellow fluid. As the fluid dries, it forms a crust. This condition is caused by staphylococci or streptococci bacteria. These bacteria cause impetigo when they get under the surface of the skin, such as through cuts, rashes, bug bites, or open sores. Treatment for this condition may include antibiotic ointment or oral antibiotics. To help prevent impetigo from spreading to other body areas, make sure you keep your fingernails short, avoid scratching, cover any blisters, and wash your hands often. If you have impetigo, stay home until you have used an antibiotic cream for 48 hours (2 days) or an oral antibiotic medicine for 24 hours (1 day). You should only return to work and activities with  other people if your skin shows significant improvement. This information is not intended to replace advice given to you by your health care provider. Make sure you discuss any questions you have with your health care provider. Document Revised: 11/06/2019 Document Reviewed: 11/06/2019 Elsevier Patient Education  2024 [employer]  

## 2023-11-10 NOTE — Progress Notes (Signed)
 BP 113/71   Pulse 64   Temp 98 F (36.7 C) (Oral)   Wt 171 lb 12.8 oz (77.9 kg)   SpO2 98%   BMI 26.31 kg/m    Subjective:    Patient ID: David Phillips, male    DOB: [birth year], 38 y.o.   MRN: [id number]  HPI: David Phillips is a 38 y.o. male  Chief Complaint  Patient presents with   Blister    Patient states he has been noticing small blister like areas on his R arm and R side for the last month. States the areas are like water filled, he pops them, and then they scab over.    SKIN INFECTION Presents today to have blisters assessed right arm and right side. Been present for past month or so.  These are filled with clear fluid and are hard to pop.  These just started recently and are showing up more.  Has not been around anyone recently with similar.  No fevers. Duration: weeks Location: various locations History of trauma in area: no Pain: no Quality: no Severity: mild Redness: yes Swelling: no Oozing: no Pus: no Fevers: no Nausea/vomiting: no Status: fluctuating Treatments attempted: cleaning with Bactin or Peroxide -- popping them  Tetanus: UTD   Relevant past medical, surgical, family and social history reviewed and updated as indicated. Interim medical history since our last visit reviewed. Allergies and medications reviewed and updated.  Review of Systems  Constitutional:  Negative for activity change, diaphoresis, fatigue and fever.  Respiratory:  Negative for cough, chest tightness, shortness of breath and wheezing.   Cardiovascular:  Negative for chest pain, palpitations and leg swelling.  Gastrointestinal: Negative.   Skin:  Positive for rash.  Neurological: Negative.   Psychiatric/Behavioral: Negative.      Per HPI unless specifically indicated above     Objective:     BP 113/71   Pulse 64   Temp 98 F (36.7 C) (Oral)   Wt 171 lb 12.8 oz (77.9 kg)   SpO2 98%   BMI 26.31 kg/m   Wt Readings from Last 3 Encounters:  11/10/23  171 lb 12.8 oz (77.9 kg)  11/01/23 173 lb 12.8 oz (78.8 kg)  05/24/23 172 lb 9.6 oz (78.3 kg)    Physical Exam Vitals and nursing note reviewed.  Constitutional:      General: He is awake. He is not in acute distress.    Appearance: He is well-developed and well-groomed. He is not ill-appearing or toxic-appearing.  HENT:     Head: Normocephalic.     Right Ear: Hearing and external ear normal.     Left Ear: Hearing and external ear normal.  Eyes:     General: Lids are normal.     Extraocular Movements: Extraocular movements intact.     Conjunctiva/sclera: Conjunctivae normal.  Neck:     Thyroid: No thyromegaly.     Vascular: No carotid bruit.  Cardiovascular:     Rate and Rhythm: Normal rate and regular rhythm.     Heart sounds: Normal heart sounds. No murmur heard.    No gallop.  Pulmonary:     Effort: No accessory muscle usage or respiratory distress.     Breath sounds: Normal breath sounds.  Abdominal:     General: Bowel sounds are normal. There is no distension.     Palpations: Abdomen is soft.     Tenderness: There is no abdominal tenderness.  Musculoskeletal:     Cervical back: Full passive  range of motion without pain.     Right lower leg: No edema.     Left lower leg: No edema.  Lymphadenopathy:     Cervical: No cervical adenopathy.  Skin:    General: Skin is warm.     Capillary Refill: Capillary refill takes less than 2 seconds.     Comments: Two small, dried areas to right arm. One interior right wrist and one upper, inner right arm above elbow.  To right side one similar area.   Neurological:     Mental Status: He is alert and oriented to person, place, and time.     Deep Tendon Reflexes: Reflexes are normal and symmetric.     Reflex Scores:      Brachioradialis reflexes are 2+ on the right side and 2+ on the left side.      Patellar reflexes are 2+ on the right side and 2+ on the left side. Psychiatric:        Attention and Perception: Attention normal.         Mood and Affect: Mood normal.        Speech: Speech normal.        Behavior: Behavior normal. Behavior is cooperative.        Thought Content: Thought content normal.     Results for orders placed or performed in visit on 11/01/23  HIV Antibody (routine testing w rflx)   Collection Time: 11/01/23  2:03 PM  Result Value Ref Range   HIV Screen 4th Generation wRfx Non Reactive Non Reactive  RPR   Collection Time: 11/01/23  2:03 PM  Result Value Ref Range   RPR Ser Ql Non Reactive Non Reactive  Hepatitis C antibody   Collection Time: 11/01/23  2:03 PM  Result Value Ref Range   Hep C Virus Ab Non Reactive Non Reactive  HSV 1 and 2 Ab, IgG   Collection Time: 11/01/23  2:17 PM  Result Value Ref Range   HSV 1 Glycoprotein G Ab, IgG Non Reactive Non Reactive   HSV 2 IgG, Type Spec Non Reactive Non Reactive  Chlamydia/Gonococcus/Trichomonas, NAA([facility])   Collection Time: 11/02/23  4:14 PM   Specimen: Urine   UR  Result Value Ref Range   Chlamydia by NAA Negative Negative   Gonococcus by NAA Negative Negative   Trich vag by NAA Negative Negative      Assessment & Plan:   Problem List Items Addressed This Visit       Other   Blisters of multiple sites - Primary   Ongoing for one month with more presenting per patient.  ?MRSA or impetigo.  Currently the blisters are crusted over.  Will start Keflex  500 MG TID and Mupirocin  ointment. Recommend he not pop blisters when present, allow them to pop or dry on their own.  Can place betadine over them to help them dry and crust over. Educated him on skin infections.  Is to return to office if ongoing.        Follow up plan: Return if symptoms worsen or fail to improve.

## 2023-11-30 ENCOUNTER — Ambulatory Visit: Payer: Self-pay

## 2023-11-30 NOTE — Telephone Encounter (Signed)
 FYI Only or Action Required?: FYI only for provider  Patient was last seen in primary care on 11/10/2023 by Lemar Pyles, NP. Called Nurse Triage reporting Diarrhea. Symptoms began several months ago. Interventions attempted: Rest, hydration, or home remedies. Symptoms are: unchanged.  Triage Disposition: See PCP When Office is Open (Within 3 Days)  Patient/caregiver understands and will follow disposition?: Yes, but will wait  Copied from CRM [id number]. Topic: Clinical - Red Word Triage >> Nov 30, 2023  9:49 AM Everlene Hobby D wrote: Been having diarrhea for about 2 months everyday. Says he googled and it says it may be parasites. Reason for Disposition  [1] Mild diarrhea (e.g., 1-3 or more stools than normal in past 24 hours) without known cause AND [2] present >  7 days  Answer Assessment - Initial Assessment Questions 1. DIARRHEA SEVERITY: How bad is the diarrhea? How many more stools have you had in the past 24 hours than normal?    - NO DIARRHEA (SCALE 0)   - MILD (SCALE 1-3): Few loose or mushy BMs; increase of 1-3 stools over normal daily number of stools; mild increase in ostomy output.   -  MODERATE (SCALE 4-7): Increase of 4-6 stools daily over normal; moderate increase in ostomy output.   -  SEVERE (SCALE 8-10; OR WORST POSSIBLE): Increase of 7 or more stools daily over normal; moderate increase in ostomy output; incontinence.     mild 2. ONSET: When did the diarrhea begin?      2 months 3. BM CONSISTENCY: How loose or watery is the diarrhea?      Liquid, occasional semi formed 4. VOMITING: Are you also vomiting? If Yes, ask: How many times in the past 24 hours?      Denies 5. ABDOMEN PAIN: Are you having any abdomen pain? If Yes, ask: What does it feel like? (e.g., crampy, dull, intermittent, constant)      Intermittent-mild 6. ABDOMEN PAIN SEVERITY: If present, ask: How bad is the pain?  (e.g., Scale 1-10; mild, moderate, or severe)   - MILD (1-3): doesn't  interfere with normal activities, abdomen soft and not tender to touch    - MODERATE (4-7): interferes with normal activities or awakens from sleep, abdomen tender to touch    - SEVERE (8-10): excruciating pain, doubled over, unable to do any normal activities       Mild lower right sided  7. ORAL INTAKE: If vomiting, Have you been able to drink liquids? How much liquids have you had in the past 24 hours?     Denies  8. HYDRATION: Any signs of dehydration? (e.g., dry mouth [not just dry lips], too weak to stand, dizziness, new weight loss) When did you last urinate?     Hydrated 9. EXPOSURE: Have you traveled to a foreign country recently? Have you been exposed to anyone with diarrhea? Could you have eaten any food that was spoiled?      10. ANTIBIOTIC USE: Are you taking antibiotics now or have you taken antibiotics in the past 2 months? yes        11. OTHER SYMPTOMS: Do you have any other symptoms? (e.g., fever, blood in stool) Denies  Protocols used: Diarrhea-A-AH

## 2023-12-05 ENCOUNTER — Encounter: Payer: Self-pay | Admitting: Nurse Practitioner

## 2023-12-05 ENCOUNTER — Ambulatory Visit (INDEPENDENT_AMBULATORY_CARE_PROVIDER_SITE_OTHER): Admitting: Nurse Practitioner

## 2023-12-05 VITALS — BP 128/74 | HR 73 | Temp 98.6°F | Resp 15 | Ht 67.76 in | Wt 175.8 lb

## 2023-12-05 DIAGNOSIS — R197 Diarrhea, unspecified: Secondary | ICD-10-CM

## 2023-12-05 NOTE — Progress Notes (Signed)
 BP 128/74 (BP Location: Left Arm, Patient Position: Sitting, Cuff Size: Large)   Pulse 73   Temp 98.6 F (37 C) (Oral)   Resp 15   Ht 5' 7.76 (1.721 m)   Wt 175 lb 12.8 oz (79.7 kg)   SpO2 96%   BMI 26.92 kg/m    Subjective:    Patient ID: David Phillips, male    DOB: 1985/07/17, 38 y.o.   MRN: [id number]  HPI: David Phillips is a 37 y.o. male  Chief Complaint  Patient presents with   Diarrhea   ABDOMINAL ISSUES Taking anti diarrheal agents but then it comes back the next day.  He isn't able to quantify how many per day he is having.   Duration: 2 months Nature: sore Location: diffuse  Severity: mild  Radiation: no Episode duration: a couple of hours Frequency: intermittent Alleviating factors: anti diarrheals Aggravating factors: doesn't know Treatments attempted: antidiarrheal Constipation: no Diarrhea: yes Episodes of diarrhea/day: can't quantify Mucous in the stool: no Heartburn: no Bloating:yes Flatulence: yes Nausea: no Vomiting: yes- after drinking and smoking weed Episodes of vomit/day: Melena or hematochezia: no Rash: no Jaundice: no Fever: no Weight loss: no    Relevant past medical, surgical, family and social history reviewed and updated as indicated. Interim medical history since our last visit reviewed. Allergies and medications reviewed and updated.  Review of Systems  Constitutional:  Negative for fever.  Gastrointestinal:  Positive for abdominal pain, diarrhea and vomiting. Negative for blood in stool and nausea.    Per HPI unless specifically indicated above     Objective:    BP 128/74 (BP Location: Left Arm, Patient Position: Sitting, Cuff Size: Large)   Pulse 73   Temp 98.6 F (37 C) (Oral)   Resp 15   Ht 5' 7.76 (1.721 m)   Wt 175 lb 12.8 oz (79.7 kg)   SpO2 96%   BMI 26.92 kg/m   Wt Readings from Last 3 Encounters:  12/05/23 175 lb 12.8 oz (79.7 kg)  11/10/23 171 lb 12.8 oz (77.9 kg)  11/01/23 173 lb  12.8 oz (78.8 kg)    Physical Exam Vitals and nursing note reviewed.  Constitutional:      General: He is not in acute distress.    Appearance: Normal appearance. He is not ill-appearing, toxic-appearing or diaphoretic.  HENT:     Head: Normocephalic.     Right Ear: External ear normal.     Left Ear: External ear normal.     Nose: Nose normal. No congestion or rhinorrhea.     Mouth/Throat:     Mouth: Mucous membranes are moist.   Eyes:     General:        Right eye: No discharge.        Left eye: No discharge.     Extraocular Movements: Extraocular movements intact.     Conjunctiva/sclera: Conjunctivae normal.     Pupils: Pupils are equal, round, and reactive to light.    Cardiovascular:     Rate and Rhythm: Normal rate and regular rhythm.     Heart sounds: No murmur heard. Pulmonary:     Effort: Pulmonary effort is normal. No respiratory distress.     Breath sounds: Normal breath sounds. No wheezing, rhonchi or rales.  Abdominal:     General: Abdomen is flat. Bowel sounds are normal.     Tenderness: There is generalized abdominal tenderness and tenderness in the right upper quadrant, right lower quadrant, left upper  quadrant and left lower quadrant. There is no right CVA tenderness, left CVA tenderness, guarding or rebound. Negative signs include Murphy's sign, Rovsing's sign, McBurney's sign, psoas sign and obturator sign.   Musculoskeletal:     Cervical back: Normal range of motion and neck supple.   Skin:    General: Skin is warm and dry.     Capillary Refill: Capillary refill takes less than 2 seconds.   Neurological:     General: No focal deficit present.     Mental Status: He is alert and oriented to person, place, and time.   Psychiatric:        Mood and Affect: Mood normal.        Behavior: Behavior normal.        Thought Content: Thought content normal.        Judgment: Judgment normal.     Results for orders placed or performed in visit on 11/01/23   HIV Antibody (routine testing w rflx)   Collection Time: 11/01/23  2:03 PM  Result Value Ref Range   HIV Screen 4th Generation wRfx Non Reactive Non Reactive  RPR   Collection Time: 11/01/23  2:03 PM  Result Value Ref Range   RPR Ser Ql Non Reactive Non Reactive  Hepatitis C antibody   Collection Time: 11/01/23  2:03 PM  Result Value Ref Range   Hep C Virus Ab Non Reactive Non Reactive  HSV 1 and 2 Ab, IgG   Collection Time: 11/01/23  2:17 PM  Result Value Ref Range   HSV 1 Glycoprotein G Ab, IgG Non Reactive Non Reactive   HSV 2 IgG, Type Spec Non Reactive Non Reactive  Chlamydia/Gonococcus/Trichomonas, NAA([facility])   Collection Time: 11/02/23  4:14 PM   Specimen: Urine   UR  Result Value Ref Range   Chlamydia by NAA Negative Negative   Gonococcus by NAA Negative Negative   Trich vag by NAA Negative Negative      Assessment & Plan:   Problem List Items Addressed This Visit   None Visit Diagnoses       Diarrhea, unspecified type    -  Primary   Will obtain stool studies and labs.  If unremarkable, will send patient to GI for further evaluation and management.   Relevant Orders   Stool C-Diff Toxin Assay   Stool Culture   Ova and parasite examination   Comp Met (CMET)   Lipase        Follow up plan: No follow-ups on file.

## 2023-12-06 ENCOUNTER — Ambulatory Visit: Payer: Self-pay | Admitting: Nurse Practitioner

## 2023-12-06 LAB — COMPREHENSIVE METABOLIC PANEL WITH GFR
ALT: 39 IU/L (ref 0–44)
AST: 34 IU/L (ref 0–40)
Albumin: 4.6 g/dL (ref 4.1–5.1)
Alkaline Phosphatase: 83 IU/L (ref 44–121)
BUN/Creatinine Ratio: 8 — ABNORMAL LOW (ref 9–20)
BUN: 9 mg/dL (ref 6–20)
Bilirubin Total: 0.3 mg/dL (ref 0.0–1.2)
CO2: 22 mmol/L (ref 20–29)
Calcium: 9.9 mg/dL (ref 8.7–10.2)
Chloride: 95 mmol/L — ABNORMAL LOW (ref 96–106)
Creatinine, Ser: 1.09 mg/dL (ref 0.76–1.27)
Globulin, Total: 2.1 g/dL (ref 1.5–4.5)
Glucose: 88 mg/dL (ref 70–99)
Potassium: 4.4 mmol/L (ref 3.5–5.2)
Sodium: 136 mmol/L (ref 134–144)
Total Protein: 6.7 g/dL (ref 6.0–8.5)
eGFR: 89 mL/min/{1.73_m2} (ref >59)

## 2023-12-06 LAB — LIPASE: Lipase: 37 U/L (ref 13–78)

## 2023-12-26 ENCOUNTER — Other Ambulatory Visit: Payer: Self-pay | Admitting: Orthopedic Surgery

## 2023-12-26 DIAGNOSIS — M25551 Pain in right hip: Secondary | ICD-10-CM

## 2023-12-26 DIAGNOSIS — M25552 Pain in left hip: Secondary | ICD-10-CM

## 2024-01-22 ENCOUNTER — Ambulatory Visit: Admission: RE | Admit: 2024-01-22 | Source: Ambulatory Visit

## 2024-01-22 ENCOUNTER — Ambulatory Visit

## 2024-04-02 ENCOUNTER — Telehealth: Payer: Self-pay | Admitting: Nurse Practitioner

## 2024-04-02 NOTE — Telephone Encounter (Signed)
 Copied from CRM [id number]. Topic: Medicare AWV >> Apr 02, 2024 11:38 AM Nathanel DEL wrote: Reason for CRM: Called LVM 04/02/2024 to schedule AWV. Please schedule Virtual or Telehealth visits ONLY.   Nathanel Paschal; Care Guide Ambulatory Clinical Support  l [facility] Direct Dial: [phone]
